# Patient Record
Sex: Male | Born: 1960 | Race: Black or African American | Hispanic: No | Marital: Married | Smoking: Never smoker
Health system: Southern US, Community
[De-identification: ages and names within clinical notes are randomized; demographics above are authoritative.]

## PROBLEM LIST (undated history)

## (undated) DIAGNOSIS — E669 Obesity, unspecified: Secondary | ICD-10-CM

## (undated) DIAGNOSIS — I10 Essential (primary) hypertension: Secondary | ICD-10-CM

## (undated) DIAGNOSIS — I509 Heart failure, unspecified: Secondary | ICD-10-CM

## (undated) DIAGNOSIS — I4891 Unspecified atrial fibrillation: Secondary | ICD-10-CM

## (undated) DIAGNOSIS — M79652 Pain in left thigh: Secondary | ICD-10-CM

## (undated) DIAGNOSIS — M1990 Unspecified osteoarthritis, unspecified site: Secondary | ICD-10-CM

## (undated) DIAGNOSIS — E785 Hyperlipidemia, unspecified: Secondary | ICD-10-CM

## (undated) DIAGNOSIS — I8390 Asymptomatic varicose veins of unspecified lower extremity: Secondary | ICD-10-CM

## (undated) DIAGNOSIS — K589 Irritable bowel syndrome without diarrhea: Secondary | ICD-10-CM

## (undated) HISTORY — DX: Unspecified atrial fibrillation: I48.91

## (undated) HISTORY — DX: Unspecified osteoarthritis, unspecified site: M19.90

## (undated) HISTORY — PX: CARDIAC CATHETERIZATION: SHX172

## (undated) HISTORY — DX: Heart failure, unspecified: I50.9

## (undated) HISTORY — DX: Pain in left thigh: M79.652

## (undated) HISTORY — DX: Irritable bowel syndrome, unspecified: K58.9

## (undated) HISTORY — DX: Asymptomatic varicose veins of unspecified lower extremity: I83.90

## (undated) HISTORY — DX: Obesity, unspecified: E66.9

## (undated) HISTORY — DX: Hyperlipidemia, unspecified: E78.5

## (undated) HISTORY — DX: Essential (primary) hypertension: I10

---

## 1998-01-12 ENCOUNTER — Encounter: Admission: RE | Admit: 1998-01-12 | Discharge: 1998-01-12 | Payer: Self-pay | Admitting: *Deleted

## 2001-06-18 ENCOUNTER — Encounter: Payer: Self-pay | Admitting: Occupational Medicine

## 2001-06-18 ENCOUNTER — Encounter: Admission: RE | Admit: 2001-06-18 | Discharge: 2001-06-18 | Payer: Self-pay | Admitting: Occupational Medicine

## 2001-12-17 ENCOUNTER — Encounter: Admission: RE | Admit: 2001-12-17 | Discharge: 2001-12-17 | Payer: Self-pay | Admitting: Family Medicine

## 2001-12-17 ENCOUNTER — Encounter: Payer: Self-pay | Admitting: Family Medicine

## 2002-01-21 ENCOUNTER — Encounter: Payer: Self-pay | Admitting: Family Medicine

## 2002-01-21 ENCOUNTER — Ambulatory Visit (HOSPITAL_COMMUNITY): Admission: RE | Admit: 2002-01-21 | Discharge: 2002-01-21 | Payer: Self-pay | Admitting: Family Medicine

## 2004-06-08 ENCOUNTER — Encounter: Admission: RE | Admit: 2004-06-08 | Discharge: 2004-06-08 | Payer: Self-pay | Admitting: Occupational Medicine

## 2004-06-20 ENCOUNTER — Encounter: Admission: RE | Admit: 2004-06-20 | Discharge: 2004-06-20 | Payer: Self-pay | Admitting: Occupational Medicine

## 2004-07-03 ENCOUNTER — Encounter: Admission: RE | Admit: 2004-07-03 | Discharge: 2004-10-01 | Payer: Self-pay | Admitting: Nurse Practitioner

## 2004-07-09 ENCOUNTER — Encounter: Admission: RE | Admit: 2004-07-09 | Discharge: 2004-07-09 | Payer: Self-pay | Admitting: Occupational Medicine

## 2004-10-02 ENCOUNTER — Encounter: Admission: RE | Admit: 2004-10-02 | Discharge: 2004-10-09 | Payer: Self-pay | Admitting: Nurse Practitioner

## 2006-10-23 ENCOUNTER — Ambulatory Visit (HOSPITAL_BASED_OUTPATIENT_CLINIC_OR_DEPARTMENT_OTHER): Admission: RE | Admit: 2006-10-23 | Discharge: 2006-10-23 | Payer: Self-pay | Admitting: General Surgery

## 2006-10-23 ENCOUNTER — Encounter (INDEPENDENT_AMBULATORY_CARE_PROVIDER_SITE_OTHER): Payer: Self-pay | Admitting: General Surgery

## 2008-09-23 ENCOUNTER — Encounter: Admission: RE | Admit: 2008-09-23 | Discharge: 2008-09-23 | Payer: Self-pay | Admitting: Family Medicine

## 2010-10-02 NOTE — Op Note (Signed)
NAME:  Parker Calderon, Parker Calderon                 ACCOUNT NO.:  [id number]   MEDICAL RECORD NO.:  [id number]          PATIENT TYPE:  AMB   LOCATION:  DSC                          FACILITY:  [facility]   PHYSICIAN:  Gabrielle Dare. Janee Morn, M.D.DATE OF BIRTH:  21-Jul-1960   DATE OF PROCEDURE:  10/23/2006  DATE OF DISCHARGE:                               OPERATIVE REPORT   PREOPERATIVE DIAGNOSIS:  Mass right chest wall.   POSTOPERATIVE DIAGNOSIS:  Mass right chest wall.   PROCEDURE:  Excision mass right chest wall, 10 cm.   SURGEON:  Gabrielle Dare. Janee Morn, M.D.   ANESTHESIA:  General with laryngeal mask airway.   HISTORY OF PRESENT ILLNESS:  Mr. Grape is a 50 year old gentleman whom  I evaluated in the office for a painful mass in his right chest wall.  CT scan had been performed.  This was consistent with a lipoma,  approximately 10 x 3 cm in size.  He presents today for elective  excision.   PROCEDURE IN DETAIL:  Informed consent was obtained.  The patient's site  was marked.  He received intravenous antibiotics.  He was brought to the  operating room.  General anesthesia was administered with laryngeal mask  airway by the anesthesia staff.  His right chest and abdomen were  prepped and draped in a sterile fashion.  0.25% Marcaine was infiltrated  for postoperative pain relief.  A transverse incision was made over the  mass.  Subcutaneous tissues were dissected down.  The mass was  encapsulated but lobulated.  It was circumferentially dissected.  It  extended down to the chest wall fascia.  It was dissected off the chest  wall fascia using Bovie cautery without going through the fascia.  It  was further circumferentially dissected from surrounding subcutaneous  fat, and again it shelled out fairly easily and was removed in one piece  and sent to pathology.  It was approximately 10 x 4 cm.  The wound was  copiously irrigated with saline.  Meticulous hemostasis was ensured.  The wound was then closed in  layers, with deep and superficial  subcutaneous tissues approximated with interrupted 2-0 Vicryl sutures.  Some additional local anesthetic was again injected, and the skin was  closed with running 4-0 Monocryl subcuticular stitch.  Sponge, needle,  and instrument counts were correct.  Benzoin, Steri-Strips, and a bulky  sterile pressure dressing were applied.  The patient tolerated the  procedure well without apparent complication and was taken to the  recovery room in stable condition.      Gabrielle Dare Janee Morn, M.D.  Electronically Signed     BET/MEDQ  D:  10/23/2006  T:  10/23/2006  Job:  [id number]   cc:   Tally Joe, M.D.

## 2011-03-07 LAB — POCT HEMOGLOBIN-HEMACUE: Hemoglobin: 16.5

## 2012-09-29 ENCOUNTER — Encounter: Payer: Self-pay | Admitting: Vascular Surgery

## 2012-09-29 ENCOUNTER — Telehealth: Payer: Self-pay | Admitting: Vascular Surgery

## 2012-09-29 ENCOUNTER — Other Ambulatory Visit: Payer: Self-pay

## 2012-09-29 DIAGNOSIS — I83893 Varicose veins of bilateral lower extremities with other complications: Secondary | ICD-10-CM

## 2012-09-29 DIAGNOSIS — M79609 Pain in unspecified limb: Secondary | ICD-10-CM

## 2012-09-29 NOTE — Telephone Encounter (Signed)
Spoke with pt, gave appt info, sent letter, unable to enter insurance info, back of card not faxed - kf

## 2012-09-30 ENCOUNTER — Other Ambulatory Visit: Payer: Self-pay

## 2012-09-30 DIAGNOSIS — I83893 Varicose veins of bilateral lower extremities with other complications: Secondary | ICD-10-CM

## 2012-09-30 DIAGNOSIS — M79609 Pain in unspecified limb: Secondary | ICD-10-CM

## 2012-11-13 ENCOUNTER — Encounter: Payer: Self-pay | Admitting: Vascular Surgery

## 2012-12-09 ENCOUNTER — Encounter: Payer: Self-pay | Admitting: Vascular Surgery

## 2012-12-14 ENCOUNTER — Encounter: Payer: Self-pay | Admitting: Surgery

## 2013-01-07 ENCOUNTER — Encounter: Payer: Self-pay | Admitting: Vascular Surgery

## 2013-01-08 ENCOUNTER — Encounter (INDEPENDENT_AMBULATORY_CARE_PROVIDER_SITE_OTHER): Payer: Managed Care, Other (non HMO) | Admitting: *Deleted

## 2013-01-08 ENCOUNTER — Ambulatory Visit (INDEPENDENT_AMBULATORY_CARE_PROVIDER_SITE_OTHER): Payer: Managed Care, Other (non HMO) | Admitting: Vascular Surgery

## 2013-01-08 ENCOUNTER — Encounter: Payer: Self-pay | Admitting: Vascular Surgery

## 2013-01-08 VITALS — BP 173/103 | HR 73 | Ht 71.0 in | Wt 294.0 lb

## 2013-01-08 DIAGNOSIS — I83893 Varicose veins of bilateral lower extremities with other complications: Secondary | ICD-10-CM

## 2013-01-08 DIAGNOSIS — M79609 Pain in unspecified limb: Secondary | ICD-10-CM

## 2013-01-08 DIAGNOSIS — I872 Venous insufficiency (chronic) (peripheral): Secondary | ICD-10-CM

## 2013-01-08 NOTE — Progress Notes (Signed)
[facility] [place]  Referred by:  Ricci Barker, PA-C [address] [place], [place] [address]  Reason for referral: B leg varicosities  History of Present Illness  Parker Calderon is a 52 y.o. (1960/05/23) male who presents with chief complaint: B leg varicosities.  Patient notes, onset of swelling years ago, associated with no obvious trigger.  The patient's symptoms include: swelling both legs, vein protrusion, and mild calf pain.  The patient has had no history of DVT, no history of pregnancy, known history of varicose vein, no history of venous stasis ulcers, no history of  Lymphedema and no history of skin changes in lower legs.  There is known family history of venous disorders.  The patient has used OTC compression stockings in the past.  Past Medical History  Diagnosis Date  . Left thigh pain     Lateral, aching  . Varicose veins   . Arthritis     Rhinitis  . Irritable bowel syndrome   . Hyperlipidemia   . Obesity, unspecified     History reviewed. No pertinent past surgical history.  History   Social History  . Marital Status: Single    Spouse Name: N/A    Number of Children: N/A  . Years of Education: N/A   Occupational History  . Not on file.   Social History Main Topics  . Smoking status: Never Smoker   . Smokeless tobacco: Never Used  . Alcohol Use: No  . Drug Use: No  . Sexual Activity: Not on file   Other Topics Concern  . Not on file   Social History Narrative  . No narrative on file    FamHx  Mother: varicose veins  Father: unknown   Current Outpatient Prescriptions on File Prior to Visit  Medication Sig Dispense Refill  . Multiple Vitamin (MULTIVITAMIN) tablet Take 1 tablet by mouth daily.       No current facility-administered medications on file prior to visit.    No Known Allergies   REVIEW OF SYSTEMS:  (Positives checked otherwise negative)  CARDIOVASCULAR:  []  chest pain, []  chest pressure, []   palpitations, []  shortness of breath when laying flat, []  shortness of breath with exertion,  []  pain in feet when walking, []  pain in feet when laying flat, []  history of blood clot in veins (DVT), []  history of phlebitis, []  swelling in legs, [x]  varicose veins  PULMONARY:  []  productive cough, []  asthma, []  wheezing  NEUROLOGIC:  []  weakness in arms or legs, []  numbness in arms or legs, []  difficulty speaking or slurred speech, []  temporary loss of vision in one eye, []  dizziness  HEMATOLOGIC:  []  bleeding problems, []  problems with blood clotting too easily  MUSCULOSKEL:  []  joint pain, []  joint swelling  GASTROINTEST:  []  vomiting blood, []  blood in stool     GENITOURINARY:  []  burning with urination, []  blood in urine  PSYCHIATRIC:  []  history of major depression  INTEGUMENTARY:  []  rashes, []  ulcers  CONSTITUTIONAL:  []  fever, []  chills   Physical Examination Filed Vitals:   01/08/13 1008  BP: 173/103  Pulse: 73  Height: 5\' 11"  (1.803 m)  Weight: 294 lb (133.358 kg)  SpO2: 97%   Body mass index is 41.02 kg/(m^2).  General: A&O x 3, WD, obese  Head: [place]/AT  Ear/Nose/Throat: Hearing grossly intact, nares w/o erythema or drainage, oropharynx w/o Erythema/Exudate  Eyes: PERRLA, EOMI  Neck: Supple, no nuchal rigidity, no palpable LAD  Pulmonary:  Sym exp, good air movt, CTAB, no rales, rhonchi, & wheezing  Cardiac: RRR, Nl S1, S2, no Murmurs, rubs or gallops  Vascular: Vessel Right Left  Radial Palpable Palpable  Brachial  Palpable  Palpable  Carotid Palpable, without bruit Palpable, without bruit  Aorta Not palpable N/A  Femoral Palpable Palpable  Popliteal Not palpable Not palpable  PT Not Palpable Not Palpable  DP Palpable Palpable   Gastrointestinal: soft, NTND, -G/R, - HSM, - masses, - CVAT B  Musculoskeletal: M/S 5/5 throughout , Extremities without ischemic changes , extensive varicosities B with some clusters on lateral calves and L lateral  thigh  Neurologic: CN 2-12 intact , Pain and light touch intact in extremities , Motor exam as listed above  Psychiatric: Judgment intact, Mood & affect appropriate for pt's clinical situation  Dermatologic: See M/S exam for extremity exam, no rashes otherwise noted  Lymph : No Cervical, Axillary, or Inguinal lymphadenopathy   Non-Invasive Vascular Imaging   BLE Venous Insufficiency Duplex (Date: 01/08/2013):   RLE: no DVT and SVT, no GSV reflux, no deep venous reflux  LLE: no DVT and SVT, no GSV reflux, no deep venous reflux  Outside Studies/Documentation 5 pages of outside documents were reviewed including: outpatient clinic chart.  Medical Decision Making  Parker Calderon is a 52 y.o. male who presents with: BLE chronic venous insufficiency (C2).   Surprisingly, the BLE venous reflux was not significantly positive.  I suspect the patient may be early in his CVI process given the extensive varicosities he has.  Some of his sx are c/w with CVI as the etiology.  Based on the patient's history and examination, I recommend: compressive therapy.  I discussed with the patient the use of her 20-30 mm thigh high compression stockings and need for 3 month trial of such.  The patient will follow up in 3 months with my partners in the [facility] for evaluation for: stab phlebectomy.  Thank you for allowing [place] to participate in this patient's care.  Leonides Sake, MD [facility] [place] Office: [phone] Pager: [phone]  01/08/2013, 11:03 AM

## 2013-02-01 ENCOUNTER — Telehealth: Payer: Self-pay | Admitting: *Deleted

## 2013-02-01 NOTE — Telephone Encounter (Signed)
Parker Calderon did not understand why the venous reflux exam was done and what the results meant.  I explained to him in detail and at length that the venous reflux exam is the diagnostic tool that vascular physicians use to diagnosis venous insufficiency in the deep and superficial systems.  I reviewed the results of his venous reflux exam which showed no reflux or venous insufficiency in the deep or superficial systems.  He requested copies of Dr. Nicky Pugh office note and the venous reflux exam on 01-08-2013.  I took his information and had Scarlette Calico from Medical Records send him the forms to fill out for medical release of records via mail per his request.

## 2013-04-13 ENCOUNTER — Ambulatory Visit: Payer: Managed Care, Other (non HMO) | Admitting: Vascular Surgery

## 2015-09-01 DIAGNOSIS — R05 Cough: Secondary | ICD-10-CM | POA: Diagnosis not present

## 2015-09-01 DIAGNOSIS — J101 Influenza due to other identified influenza virus with other respiratory manifestations: Secondary | ICD-10-CM | POA: Diagnosis not present

## 2015-09-01 DIAGNOSIS — H1013 Acute atopic conjunctivitis, bilateral: Secondary | ICD-10-CM | POA: Diagnosis not present

## 2016-03-15 DIAGNOSIS — R030 Elevated blood-pressure reading, without diagnosis of hypertension: Secondary | ICD-10-CM | POA: Diagnosis not present

## 2016-03-15 DIAGNOSIS — N62 Hypertrophy of breast: Secondary | ICD-10-CM | POA: Diagnosis not present

## 2016-03-15 DIAGNOSIS — R7303 Prediabetes: Secondary | ICD-10-CM | POA: Diagnosis not present

## 2016-03-15 DIAGNOSIS — R6882 Decreased libido: Secondary | ICD-10-CM | POA: Diagnosis not present

## 2016-03-18 ENCOUNTER — Other Ambulatory Visit: Payer: Self-pay | Admitting: Family Medicine

## 2016-03-18 DIAGNOSIS — N62 Hypertrophy of breast: Secondary | ICD-10-CM

## 2016-04-15 DIAGNOSIS — E291 Testicular hypofunction: Secondary | ICD-10-CM | POA: Diagnosis not present

## 2016-04-16 ENCOUNTER — Other Ambulatory Visit: Payer: Self-pay | Admitting: Family Medicine

## 2016-04-16 DIAGNOSIS — N62 Hypertrophy of breast: Secondary | ICD-10-CM

## 2016-04-22 ENCOUNTER — Ambulatory Visit
Admission: RE | Admit: 2016-04-22 | Discharge: 2016-04-22 | Disposition: A | Discharge status: Home / Self Care | Payer: BLUE CROSS/BLUE SHIELD | Source: Ambulatory Visit | Attending: Family Medicine | Admitting: Family Medicine

## 2016-04-22 ENCOUNTER — Other Ambulatory Visit: Payer: Self-pay | Admitting: Family Medicine

## 2016-04-22 DIAGNOSIS — N62 Hypertrophy of breast: Secondary | ICD-10-CM

## 2016-04-22 DIAGNOSIS — N6489 Other specified disorders of breast: Secondary | ICD-10-CM | POA: Diagnosis not present

## 2016-04-22 DIAGNOSIS — R928 Other abnormal and inconclusive findings on diagnostic imaging of breast: Secondary | ICD-10-CM | POA: Diagnosis not present

## 2016-04-30 ENCOUNTER — Other Ambulatory Visit: Payer: Self-pay | Admitting: Family Medicine

## 2016-04-30 DIAGNOSIS — E221 Hyperprolactinemia: Secondary | ICD-10-CM

## 2016-04-30 DIAGNOSIS — E291 Testicular hypofunction: Secondary | ICD-10-CM

## 2016-05-06 ENCOUNTER — Ambulatory Visit
Admission: RE | Admit: 2016-05-06 | Discharge: 2016-05-06 | Disposition: A | Discharge status: Home / Self Care | Payer: BLUE CROSS/BLUE SHIELD | Source: Ambulatory Visit | Attending: Family Medicine | Admitting: Family Medicine

## 2016-05-06 DIAGNOSIS — E221 Hyperprolactinemia: Secondary | ICD-10-CM

## 2016-05-06 DIAGNOSIS — R930 Abnormal findings on diagnostic imaging of skull and head, not elsewhere classified: Secondary | ICD-10-CM | POA: Diagnosis not present

## 2016-05-06 DIAGNOSIS — E291 Testicular hypofunction: Secondary | ICD-10-CM

## 2016-05-06 MED ORDER — GADOBENATE DIMEGLUMINE 529 MG/ML IV SOLN
10.0000 mL | Freq: Once | INTRAVENOUS | Status: AC | PRN
  Administered 2016-05-06: 10 mL via INTRAVENOUS

## 2016-05-27 DIAGNOSIS — D352 Benign neoplasm of pituitary gland: Secondary | ICD-10-CM | POA: Diagnosis not present

## 2016-05-27 DIAGNOSIS — R030 Elevated blood-pressure reading, without diagnosis of hypertension: Secondary | ICD-10-CM | POA: Diagnosis not present

## 2016-05-27 DIAGNOSIS — Z6841 Body Mass Index (BMI) 40.0 and over, adult: Secondary | ICD-10-CM | POA: Diagnosis not present

## 2016-06-04 DIAGNOSIS — E782 Mixed hyperlipidemia: Secondary | ICD-10-CM | POA: Diagnosis not present

## 2016-06-24 DIAGNOSIS — E291 Testicular hypofunction: Secondary | ICD-10-CM | POA: Diagnosis not present

## 2016-06-24 DIAGNOSIS — R7309 Other abnormal glucose: Secondary | ICD-10-CM | POA: Diagnosis not present

## 2016-06-24 DIAGNOSIS — E221 Hyperprolactinemia: Secondary | ICD-10-CM | POA: Diagnosis not present

## 2017-03-22 DIAGNOSIS — J069 Acute upper respiratory infection, unspecified: Secondary | ICD-10-CM | POA: Diagnosis not present

## 2017-06-07 DIAGNOSIS — R030 Elevated blood-pressure reading, without diagnosis of hypertension: Secondary | ICD-10-CM | POA: Diagnosis not present

## 2017-06-07 DIAGNOSIS — Z136 Encounter for screening for cardiovascular disorders: Secondary | ICD-10-CM | POA: Diagnosis not present

## 2017-06-07 DIAGNOSIS — Z13220 Encounter for screening for lipoid disorders: Secondary | ICD-10-CM | POA: Diagnosis not present

## 2017-06-07 DIAGNOSIS — Z6841 Body Mass Index (BMI) 40.0 and over, adult: Secondary | ICD-10-CM | POA: Diagnosis not present

## 2017-06-07 DIAGNOSIS — Z713 Dietary counseling and surveillance: Secondary | ICD-10-CM | POA: Diagnosis not present

## 2018-01-24 DIAGNOSIS — H1010 Acute atopic conjunctivitis, unspecified eye: Secondary | ICD-10-CM | POA: Diagnosis not present

## 2018-01-24 DIAGNOSIS — J069 Acute upper respiratory infection, unspecified: Secondary | ICD-10-CM | POA: Diagnosis not present

## 2018-02-15 DIAGNOSIS — R05 Cough: Secondary | ICD-10-CM | POA: Diagnosis not present

## 2018-06-13 DIAGNOSIS — Z6841 Body Mass Index (BMI) 40.0 and over, adult: Secondary | ICD-10-CM | POA: Diagnosis not present

## 2018-06-13 DIAGNOSIS — R030 Elevated blood-pressure reading, without diagnosis of hypertension: Secondary | ICD-10-CM | POA: Diagnosis not present

## 2018-06-13 DIAGNOSIS — Z13220 Encounter for screening for lipoid disorders: Secondary | ICD-10-CM | POA: Diagnosis not present

## 2018-06-13 DIAGNOSIS — Z713 Dietary counseling and surveillance: Secondary | ICD-10-CM | POA: Diagnosis not present

## 2018-06-13 DIAGNOSIS — Z136 Encounter for screening for cardiovascular disorders: Secondary | ICD-10-CM | POA: Diagnosis not present

## 2018-12-14 ENCOUNTER — Other Ambulatory Visit: Payer: Self-pay

## 2018-12-14 DIAGNOSIS — Z20822 Contact with and (suspected) exposure to covid-19: Secondary | ICD-10-CM

## 2018-12-14 DIAGNOSIS — R6889 Other general symptoms and signs: Secondary | ICD-10-CM | POA: Diagnosis not present

## 2018-12-16 LAB — NOVEL CORONAVIRUS, NAA: SARS-CoV-2, NAA: NOT DETECTED

## 2019-03-15 DIAGNOSIS — R109 Unspecified abdominal pain: Secondary | ICD-10-CM | POA: Diagnosis not present

## 2019-03-15 DIAGNOSIS — I480 Paroxysmal atrial fibrillation: Secondary | ICD-10-CM | POA: Diagnosis not present

## 2019-03-15 DIAGNOSIS — R0602 Shortness of breath: Secondary | ICD-10-CM | POA: Diagnosis not present

## 2019-03-15 DIAGNOSIS — R7303 Prediabetes: Secondary | ICD-10-CM | POA: Diagnosis not present

## 2019-03-15 DIAGNOSIS — E229 Hyperfunction of pituitary gland, unspecified: Secondary | ICD-10-CM | POA: Diagnosis not present

## 2019-03-21 ENCOUNTER — Other Ambulatory Visit: Payer: Self-pay

## 2019-03-21 ENCOUNTER — Encounter (HOSPITAL_COMMUNITY): Payer: Self-pay

## 2019-03-21 ENCOUNTER — Emergency Department (HOSPITAL_COMMUNITY): Payer: BC Managed Care – PPO

## 2019-03-21 DIAGNOSIS — D72829 Elevated white blood cell count, unspecified: Secondary | ICD-10-CM | POA: Diagnosis not present

## 2019-03-21 DIAGNOSIS — I161 Hypertensive emergency: Secondary | ICD-10-CM | POA: Diagnosis not present

## 2019-03-21 DIAGNOSIS — E669 Obesity, unspecified: Secondary | ICD-10-CM | POA: Diagnosis not present

## 2019-03-21 DIAGNOSIS — Z6841 Body Mass Index (BMI) 40.0 and over, adult: Secondary | ICD-10-CM

## 2019-03-21 DIAGNOSIS — Z20828 Contact with and (suspected) exposure to other viral communicable diseases: Secondary | ICD-10-CM | POA: Diagnosis not present

## 2019-03-21 DIAGNOSIS — K589 Irritable bowel syndrome without diarrhea: Secondary | ICD-10-CM | POA: Diagnosis present

## 2019-03-21 DIAGNOSIS — N179 Acute kidney failure, unspecified: Secondary | ICD-10-CM | POA: Diagnosis not present

## 2019-03-21 DIAGNOSIS — E662 Morbid (severe) obesity with alveolar hypoventilation: Secondary | ICD-10-CM | POA: Diagnosis present

## 2019-03-21 DIAGNOSIS — M1990 Unspecified osteoarthritis, unspecified site: Secondary | ICD-10-CM | POA: Diagnosis not present

## 2019-03-21 DIAGNOSIS — R030 Elevated blood-pressure reading, without diagnosis of hypertension: Secondary | ICD-10-CM | POA: Diagnosis not present

## 2019-03-21 DIAGNOSIS — Z841 Family history of disorders of kidney and ureter: Secondary | ICD-10-CM

## 2019-03-21 DIAGNOSIS — R601 Generalized edema: Secondary | ICD-10-CM | POA: Diagnosis not present

## 2019-03-21 DIAGNOSIS — I340 Nonrheumatic mitral (valve) insufficiency: Secondary | ICD-10-CM | POA: Diagnosis not present

## 2019-03-21 DIAGNOSIS — M7989 Other specified soft tissue disorders: Secondary | ICD-10-CM | POA: Diagnosis present

## 2019-03-21 DIAGNOSIS — E785 Hyperlipidemia, unspecified: Secondary | ICD-10-CM | POA: Diagnosis present

## 2019-03-21 DIAGNOSIS — I5082 Biventricular heart failure: Secondary | ICD-10-CM | POA: Diagnosis present

## 2019-03-21 DIAGNOSIS — E876 Hypokalemia: Secondary | ICD-10-CM | POA: Diagnosis present

## 2019-03-21 DIAGNOSIS — I5021 Acute systolic (congestive) heart failure: Secondary | ICD-10-CM | POA: Diagnosis not present

## 2019-03-21 DIAGNOSIS — Z79899 Other long term (current) drug therapy: Secondary | ICD-10-CM | POA: Diagnosis not present

## 2019-03-21 DIAGNOSIS — Z7901 Long term (current) use of anticoagulants: Secondary | ICD-10-CM | POA: Diagnosis not present

## 2019-03-21 DIAGNOSIS — I4891 Unspecified atrial fibrillation: Secondary | ICD-10-CM | POA: Diagnosis present

## 2019-03-21 DIAGNOSIS — I361 Nonrheumatic tricuspid (valve) insufficiency: Secondary | ICD-10-CM | POA: Diagnosis not present

## 2019-03-21 DIAGNOSIS — I110 Hypertensive heart disease with heart failure: Secondary | ICD-10-CM | POA: Diagnosis not present

## 2019-03-21 DIAGNOSIS — R0989 Other specified symptoms and signs involving the circulatory and respiratory systems: Secondary | ICD-10-CM | POA: Diagnosis not present

## 2019-03-21 DIAGNOSIS — I342 Nonrheumatic mitral (valve) stenosis: Secondary | ICD-10-CM | POA: Diagnosis not present

## 2019-03-21 DIAGNOSIS — I509 Heart failure, unspecified: Secondary | ICD-10-CM | POA: Diagnosis not present

## 2019-03-21 LAB — CBC
HCT: 46.1 % (ref 39.0–52.0)
Hemoglobin: 14.6 g/dL (ref 13.0–17.0)
MCH: 31.7 pg (ref 26.0–34.0)
MCHC: 31.7 g/dL (ref 30.0–36.0)
MCV: 100.2 fL — ABNORMAL HIGH (ref 80.0–100.0)
Platelets: 277 10*3/uL (ref 150–400)
RBC: 4.6 MIL/uL (ref 4.22–5.81)
RDW: 14.3 % (ref 11.5–15.5)
WBC: 12.1 10*3/uL — ABNORMAL HIGH (ref 4.0–10.5)
nRBC: 0 % (ref 0.0–0.2)

## 2019-03-21 LAB — BASIC METABOLIC PANEL
Anion gap: 9 (ref 5–15)
BUN: 16 mg/dL (ref 6–20)
CO2: 26 mmol/L (ref 22–32)
Calcium: 8.6 mg/dL — ABNORMAL LOW (ref 8.9–10.3)
Chloride: 106 mmol/L (ref 98–111)
Creatinine, Ser: 0.9 mg/dL (ref 0.61–1.24)
GFR calc Af Amer: >60 mL/min (ref >60)
GFR calc non Af Amer: >60 mL/min (ref >60)
Glucose, Bld: 112 mg/dL — ABNORMAL HIGH (ref 70–99)
Potassium: 3.3 mmol/L — ABNORMAL LOW (ref 3.5–5.1)
Sodium: 141 mmol/L (ref 135–145)

## 2019-03-21 MED ORDER — SODIUM CHLORIDE 0.9% FLUSH
3.0000 mL | Freq: Once | INTRAVENOUS | Status: AC
  Administered 2019-03-22: 3 mL via INTRAVENOUS

## 2019-03-21 NOTE — ED Triage Notes (Signed)
Pt arrived stating that he has been short of breath and having edema in the abdomen and bilateral feet. Reports today was the first day of diltiazem and Zarelto. Was told by provider to come in for increased swelling.

## 2019-03-22 ENCOUNTER — Encounter (HOSPITAL_COMMUNITY): Payer: Self-pay

## 2019-03-22 ENCOUNTER — Other Ambulatory Visit: Payer: Self-pay

## 2019-03-22 ENCOUNTER — Encounter (HOSPITAL_COMMUNITY): Payer: Self-pay | Admitting: Emergency Medicine

## 2019-03-22 ENCOUNTER — Inpatient Hospital Stay (HOSPITAL_COMMUNITY): Payer: BC Managed Care – PPO

## 2019-03-22 ENCOUNTER — Inpatient Hospital Stay (HOSPITAL_COMMUNITY)
Admission: EM | Admit: 2019-03-22 | Discharge: 2019-03-27 | DRG: 286 | Disposition: A | Discharge status: Home / Self Care | Payer: BC Managed Care – PPO | Attending: Cardiovascular Disease | Admitting: Cardiovascular Disease

## 2019-03-22 DIAGNOSIS — I5082 Biventricular heart failure: Secondary | ICD-10-CM | POA: Diagnosis present

## 2019-03-22 DIAGNOSIS — R030 Elevated blood-pressure reading, without diagnosis of hypertension: Secondary | ICD-10-CM

## 2019-03-22 DIAGNOSIS — I342 Nonrheumatic mitral (valve) stenosis: Secondary | ICD-10-CM | POA: Diagnosis not present

## 2019-03-22 DIAGNOSIS — I5021 Acute systolic (congestive) heart failure: Secondary | ICD-10-CM

## 2019-03-22 DIAGNOSIS — I110 Hypertensive heart disease with heart failure: Secondary | ICD-10-CM | POA: Diagnosis not present

## 2019-03-22 DIAGNOSIS — N179 Acute kidney failure, unspecified: Secondary | ICD-10-CM | POA: Diagnosis present

## 2019-03-22 DIAGNOSIS — Z6841 Body Mass Index (BMI) 40.0 and over, adult: Secondary | ICD-10-CM | POA: Diagnosis not present

## 2019-03-22 DIAGNOSIS — I4891 Unspecified atrial fibrillation: Secondary | ICD-10-CM | POA: Diagnosis present

## 2019-03-22 DIAGNOSIS — Z79899 Other long term (current) drug therapy: Secondary | ICD-10-CM | POA: Diagnosis not present

## 2019-03-22 DIAGNOSIS — I509 Heart failure, unspecified: Secondary | ICD-10-CM

## 2019-03-22 DIAGNOSIS — M7989 Other specified soft tissue disorders: Secondary | ICD-10-CM | POA: Diagnosis present

## 2019-03-22 DIAGNOSIS — I340 Nonrheumatic mitral (valve) insufficiency: Secondary | ICD-10-CM | POA: Diagnosis not present

## 2019-03-22 DIAGNOSIS — Z20828 Contact with and (suspected) exposure to other viral communicable diseases: Secondary | ICD-10-CM | POA: Diagnosis present

## 2019-03-22 DIAGNOSIS — E8770 Fluid overload, unspecified: Secondary | ICD-10-CM

## 2019-03-22 DIAGNOSIS — E785 Hyperlipidemia, unspecified: Secondary | ICD-10-CM | POA: Diagnosis not present

## 2019-03-22 DIAGNOSIS — I361 Nonrheumatic tricuspid (valve) insufficiency: Secondary | ICD-10-CM | POA: Diagnosis not present

## 2019-03-22 DIAGNOSIS — E662 Morbid (severe) obesity with alveolar hypoventilation: Secondary | ICD-10-CM | POA: Diagnosis present

## 2019-03-22 DIAGNOSIS — I161 Hypertensive emergency: Secondary | ICD-10-CM | POA: Diagnosis not present

## 2019-03-22 DIAGNOSIS — E876 Hypokalemia: Secondary | ICD-10-CM | POA: Diagnosis present

## 2019-03-22 DIAGNOSIS — R601 Generalized edema: Secondary | ICD-10-CM

## 2019-03-22 DIAGNOSIS — Z7901 Long term (current) use of anticoagulants: Secondary | ICD-10-CM | POA: Diagnosis not present

## 2019-03-22 DIAGNOSIS — E669 Obesity, unspecified: Secondary | ICD-10-CM | POA: Diagnosis present

## 2019-03-22 DIAGNOSIS — Z841 Family history of disorders of kidney and ureter: Secondary | ICD-10-CM | POA: Diagnosis not present

## 2019-03-22 DIAGNOSIS — K589 Irritable bowel syndrome without diarrhea: Secondary | ICD-10-CM | POA: Diagnosis present

## 2019-03-22 DIAGNOSIS — D72829 Elevated white blood cell count, unspecified: Secondary | ICD-10-CM | POA: Diagnosis present

## 2019-03-22 DIAGNOSIS — R9431 Abnormal electrocardiogram [ECG] [EKG]: Secondary | ICD-10-CM

## 2019-03-22 DIAGNOSIS — M1990 Unspecified osteoarthritis, unspecified site: Secondary | ICD-10-CM | POA: Diagnosis present

## 2019-03-22 LAB — BASIC METABOLIC PANEL
Anion gap: 11 (ref 5–15)
Anion gap: 9 (ref 5–15)
Anion gap: 10 (ref 5–15)
BUN: 15 mg/dL (ref 6–20)
BUN: 14 mg/dL (ref 6–20)
BUN: 15 mg/dL (ref 6–20)
CO2: 28 mmol/L (ref 22–32)
CO2: 29 mmol/L (ref 22–32)
CO2: 28 mmol/L (ref 22–32)
Calcium: 8.9 mg/dL (ref 8.9–10.3)
Calcium: 8.9 mg/dL (ref 8.9–10.3)
Calcium: 9 mg/dL (ref 8.9–10.3)
Chloride: 104 mmol/L (ref 98–111)
Chloride: 103 mmol/L (ref 98–111)
Chloride: 105 mmol/L (ref 98–111)
Creatinine, Ser: 0.88 mg/dL (ref 0.61–1.24)
Creatinine, Ser: 0.96 mg/dL (ref 0.61–1.24)
Creatinine, Ser: 0.97 mg/dL (ref 0.61–1.24)
GFR calc Af Amer: >60 mL/min (ref >60)
GFR calc Af Amer: >60 mL/min (ref >60)
GFR calc Af Amer: >60 mL/min (ref >60)
GFR calc non Af Amer: >60 mL/min (ref >60)
GFR calc non Af Amer: >60 mL/min (ref >60)
GFR calc non Af Amer: >60 mL/min (ref >60)
Glucose, Bld: 106 mg/dL — ABNORMAL HIGH (ref 70–99)
Glucose, Bld: 117 mg/dL — ABNORMAL HIGH (ref 70–99)
Glucose, Bld: 138 mg/dL — ABNORMAL HIGH (ref 70–99)
Potassium: 3 mmol/L — ABNORMAL LOW (ref 3.5–5.1)
Potassium: 3.1 mmol/L — ABNORMAL LOW (ref 3.5–5.1)
Potassium: 3.5 mmol/L (ref 3.5–5.1)
Sodium: 143 mmol/L (ref 135–145)
Sodium: 141 mmol/L (ref 135–145)
Sodium: 143 mmol/L (ref 135–145)

## 2019-03-22 LAB — URINALYSIS, ROUTINE W REFLEX MICROSCOPIC
Bilirubin Urine: NEGATIVE
Glucose, UA: NEGATIVE mg/dL
Ketones, ur: NEGATIVE mg/dL
Leukocytes,Ua: NEGATIVE
Nitrite: NEGATIVE
Protein, ur: NEGATIVE mg/dL
Specific Gravity, Urine: 1.005 (ref 1.005–1.030)
pH: 5 (ref 5.0–8.0)

## 2019-03-22 LAB — SARS CORONAVIRUS 2 BY RT PCR (HOSPITAL ORDER, PERFORMED IN ~~LOC~~ HOSPITAL LAB): SARS Coronavirus 2: NEGATIVE

## 2019-03-22 LAB — CBC
HCT: 46.9 % (ref 39.0–52.0)
Hemoglobin: 14.6 g/dL (ref 13.0–17.0)
MCH: 31.3 pg (ref 26.0–34.0)
MCHC: 31.1 g/dL (ref 30.0–36.0)
MCV: 100.6 fL — ABNORMAL HIGH (ref 80.0–100.0)
Platelets: 285 10*3/uL (ref 150–400)
RBC: 4.66 MIL/uL (ref 4.22–5.81)
RDW: 14.6 % (ref 11.5–15.5)
WBC: 12.2 10*3/uL — ABNORMAL HIGH (ref 4.0–10.5)
nRBC: 0 % (ref 0.0–0.2)

## 2019-03-22 LAB — ECHOCARDIOGRAM COMPLETE
Height: 71 in
Weight: 5888 oz

## 2019-03-22 LAB — PROTIME-INR
INR: 1.5 — ABNORMAL HIGH (ref 0.8–1.2)
Prothrombin Time: 18.2 seconds — ABNORMAL HIGH (ref 11.4–15.2)

## 2019-03-22 LAB — MAGNESIUM: Magnesium: 2 mg/dL (ref 1.7–2.4)

## 2019-03-22 LAB — LIPID PANEL
Cholesterol: 159 mg/dL (ref 0–200)
HDL: 31 mg/dL — ABNORMAL LOW (ref >40)
LDL Cholesterol: 119 mg/dL — ABNORMAL HIGH (ref 0–99)
Total CHOL/HDL Ratio: 5.1 RATIO
Triglycerides: 44 mg/dL (ref <150)
VLDL: 9 mg/dL (ref 0–40)

## 2019-03-22 LAB — T4, FREE: Free T4: 1.19 ng/dL — ABNORMAL HIGH (ref 0.61–1.12)

## 2019-03-22 LAB — APTT
aPTT: 35 seconds (ref 24–36)
aPTT: 37 seconds — ABNORMAL HIGH (ref 24–36)
aPTT: 59 seconds — ABNORMAL HIGH (ref 24–36)
aPTT: 90 seconds — ABNORMAL HIGH (ref 24–36)

## 2019-03-22 LAB — TROPONIN I (HIGH SENSITIVITY)
Troponin I (High Sensitivity): 28 ng/L — ABNORMAL HIGH (ref <18)
Troponin I (High Sensitivity): 25 ng/L — ABNORMAL HIGH (ref <18)

## 2019-03-22 LAB — HEMOGLOBIN A1C
Hgb A1c MFr Bld: 6.1 % — ABNORMAL HIGH (ref 4.8–5.6)
Mean Plasma Glucose: 128.37 mg/dL

## 2019-03-22 LAB — MRSA PCR SCREENING: MRSA by PCR: POSITIVE — AB

## 2019-03-22 LAB — HIV ANTIBODY (ROUTINE TESTING W REFLEX): HIV Screen 4th Generation wRfx: NONREACTIVE

## 2019-03-22 LAB — HEPARIN LEVEL (UNFRACTIONATED): Heparin Unfractionated: >2.2 IU/mL — ABNORMAL HIGH (ref 0.30–0.70)

## 2019-03-22 LAB — BRAIN NATRIURETIC PEPTIDE: B Natriuretic Peptide: 261.6 pg/mL — ABNORMAL HIGH (ref 0.0–100.0)

## 2019-03-22 LAB — TSH: TSH: 1.22 u[IU]/mL (ref 0.350–4.500)

## 2019-03-22 MED ORDER — FUROSEMIDE 10 MG/ML IJ SOLN
60.0000 mg | Freq: Once | INTRAMUSCULAR | Status: AC
  Administered 2019-03-22: 60 mg via INTRAVENOUS
  Filled 2019-03-22: qty 6

## 2019-03-22 MED ORDER — HYDRALAZINE HCL 50 MG PO TABS: 50.0000 mg | ORAL_TABLET | Freq: Four times a day (QID) | ORAL | Status: DC | PRN

## 2019-03-22 MED ORDER — POTASSIUM CHLORIDE CRYS ER 20 MEQ PO TBCR
40.0000 meq | EXTENDED_RELEASE_TABLET | ORAL | Status: AC
  Administered 2019-03-22 (×2): 40 meq via ORAL
  Filled 2019-03-22 (×2): qty 2

## 2019-03-22 MED ORDER — FUROSEMIDE 10 MG/ML IJ SOLN
40.0000 mg | Freq: Two times a day (BID) | INTRAMUSCULAR | Status: DC
  Administered 2019-03-22: 40 mg via INTRAVENOUS
  Filled 2019-03-22: qty 4

## 2019-03-22 MED ORDER — AMIODARONE HCL IN DEXTROSE 360-4.14 MG/200ML-% IV SOLN
60.0000 mg/h | INTRAVENOUS | Status: DC
  Administered 2019-03-22: 60 mg/h via INTRAVENOUS
  Filled 2019-03-22 (×2): qty 200

## 2019-03-22 MED ORDER — FUROSEMIDE 10 MG/ML IJ SOLN
40.0000 mg | Freq: Once | INTRAMUSCULAR | Status: AC
  Administered 2019-03-22: 40 mg via INTRAVENOUS
  Filled 2019-03-22: qty 4

## 2019-03-22 MED ORDER — POTASSIUM CHLORIDE CRYS ER 20 MEQ PO TBCR: 40.0000 meq | EXTENDED_RELEASE_TABLET | Freq: Two times a day (BID) | ORAL | Status: DC

## 2019-03-22 MED ORDER — CHLORHEXIDINE GLUCONATE CLOTH 2 % EX PADS
6.0000 | MEDICATED_PAD | Freq: Every day | CUTANEOUS | Status: DC
  Administered 2019-03-22 – 2019-03-26 (×5): 6 via TOPICAL

## 2019-03-22 MED ORDER — AMIODARONE HCL IN DEXTROSE 360-4.14 MG/200ML-% IV SOLN
30.0000 mg/h | INTRAVENOUS | Status: DC
  Administered 2019-03-22 – 2019-03-25 (×7): 30 mg/h via INTRAVENOUS
  Filled 2019-03-22 (×8): qty 200

## 2019-03-22 MED ORDER — ISOSORB DINITRATE-HYDRALAZINE 20-37.5 MG PO TABS
2.0000 | ORAL_TABLET | Freq: Three times a day (TID) | ORAL | Status: DC
  Administered 2019-03-22 – 2019-03-25 (×10): 2 via ORAL
  Filled 2019-03-22 (×11): qty 2

## 2019-03-22 MED ORDER — HEPARIN (PORCINE) 25000 UT/250ML-% IV SOLN
1500.0000 [IU]/h | INTRAVENOUS | Status: DC
  Administered 2019-03-22: 1700 [IU]/h via INTRAVENOUS
  Administered 2019-03-23 – 2019-03-25 (×3): 1500 [IU]/h via INTRAVENOUS
  Filled 2019-03-22 (×4): qty 250

## 2019-03-22 MED ORDER — MUPIROCIN 2 % EX OINT
1.0000 "application " | TOPICAL_OINTMENT | Freq: Two times a day (BID) | CUTANEOUS | Status: AC
  Administered 2019-03-22 – 2019-03-26 (×10): 1 via NASAL
  Filled 2019-03-22 (×4): qty 22

## 2019-03-22 MED ORDER — SPIRONOLACTONE 25 MG PO TABS
25.0000 mg | ORAL_TABLET | Freq: Every day | ORAL | Status: DC
  Administered 2019-03-22 – 2019-03-27 (×6): 25 mg via ORAL
  Filled 2019-03-22 (×6): qty 1

## 2019-03-22 MED ORDER — DILTIAZEM HCL-DEXTROSE 125-5 MG/125ML-% IV SOLN (PREMIX)
5.0000 mg/h | INTRAVENOUS | Status: DC
  Administered 2019-03-22: 15 mg/h via INTRAVENOUS
  Administered 2019-03-22: 5 mg/h via INTRAVENOUS
  Filled 2019-03-22 (×3): qty 125

## 2019-03-22 MED ORDER — HEPARIN (PORCINE) 25000 UT/250ML-% IV SOLN
1500.0000 [IU]/h | INTRAVENOUS | Status: DC
  Administered 2019-03-22: 1500 [IU]/h via INTRAVENOUS
  Filled 2019-03-22: qty 250

## 2019-03-22 MED ORDER — PERFLUTREN LIPID MICROSPHERE
1.0000 mL | INTRAVENOUS | Status: AC | PRN
  Administered 2019-03-22: 3 mL via INTRAVENOUS
  Filled 2019-03-22: qty 10

## 2019-03-22 MED ORDER — ATORVASTATIN CALCIUM 40 MG PO TABS
40.0000 mg | ORAL_TABLET | Freq: Every day | ORAL | Status: DC
  Administered 2019-03-22 – 2019-03-26 (×5): 40 mg via ORAL
  Filled 2019-03-22 (×5): qty 1

## 2019-03-22 MED ORDER — HYDRALAZINE HCL 50 MG PO TABS: 100.0000 mg | ORAL_TABLET | Freq: Four times a day (QID) | ORAL | Status: DC | PRN

## 2019-03-22 MED ORDER — FUROSEMIDE 10 MG/ML IJ SOLN
100.0000 mg | Freq: Two times a day (BID) | INTRAVENOUS | Status: DC
  Administered 2019-03-22: 100 mg via INTRAVENOUS
  Filled 2019-03-22 (×2): qty 10

## 2019-03-22 MED ORDER — AMIODARONE LOAD VIA INFUSION
150.0000 mg | Freq: Once | INTRAVENOUS | Status: AC
  Administered 2019-03-22: 150 mg via INTRAVENOUS
  Filled 2019-03-22: qty 83.34

## 2019-03-22 MED ORDER — POTASSIUM CHLORIDE CRYS ER 20 MEQ PO TBCR
40.0000 meq | EXTENDED_RELEASE_TABLET | Freq: Once | ORAL | Status: AC
  Administered 2019-03-22: 40 meq via ORAL
  Filled 2019-03-22: qty 2

## 2019-03-22 MED ORDER — ACETAMINOPHEN 325 MG PO TABS: 650.0000 mg | ORAL_TABLET | ORAL | Status: DC | PRN

## 2019-03-22 MED ORDER — POTASSIUM CHLORIDE CRYS ER 20 MEQ PO TBCR: 40.0000 meq | EXTENDED_RELEASE_TABLET | Freq: Once | ORAL | Status: DC

## 2019-03-22 MED ORDER — POTASSIUM CHLORIDE 10 MEQ/100ML IV SOLN: 10.0000 meq | INTRAVENOUS | Status: DC

## 2019-03-22 MED ORDER — DILTIAZEM LOAD VIA INFUSION
15.0000 mg | Freq: Once | INTRAVENOUS | Status: AC
  Administered 2019-03-22: 15 mg via INTRAVENOUS
  Filled 2019-03-22: qty 15

## 2019-03-22 NOTE — ED Provider Notes (Signed)
[facility] DEPT Provider Note   CSN: [id number] Arrival date & time: 03/21/19  2059     History   Chief Complaint Chief Complaint  Patient presents with  . Shortness of Breath  . Leg Swelling    HPI Parker Calderon is a 58 y.o. male.     The history is provided by the patient.  Shortness of Breath Severity:  Severe Onset quality:  Gradual Duration:  7 days Timing:  Constant Progression:  Worsening Chronicity:  New Context: not animal exposure, not emotional upset and not fumes   Relieved by:  Nothing Worsened by:  Nothing Ineffective treatments:  None tried Associated symptoms: no abdominal pain, no chest pain, no cough, no diaphoresis, no fever and no wheezing   Associated symptoms comment:  Abdominal and leg swelling Risk factors: no recent alcohol use, no hx of PE/DVT and no recent surgery   Seen by PMD for same on Wednesday and told to start diltiazem and xarelto based on EKG and did not start it. Now swelling and SOB are worse.    Past Medical History:  Diagnosis Date  . Arthritis    Rhinitis  . Hyperlipidemia   . Irritable bowel syndrome   . Left thigh pain    Lateral, aching  . Obesity, unspecified   . Varicose veins     Patient Active Problem List   Diagnosis Date Noted  . Varicose veins of lower extremities with other complications [id number]  . Chronic venous insufficiency 01/08/2013    History reviewed. No pertinent surgical history.      Home Medications    Prior to Admission medications   Medication Sig Start Date End Date Taking? Authorizing Provider  diltiazem (CARDIZEM CD) 120 MG 24 hr capsule Take 120 mg by mouth daily. 03/15/19  Yes [provider]  Multiple Vitamin (MULTIVITAMIN) tablet Take 1 tablet by mouth daily.   Yes [provider]  rivaroxaban (XARELTO) 20 MG TABS tablet Take 20 mg by mouth daily with supper.   Yes [provider]    Family History No family history  on file.  Social History Social History   Tobacco Use  . Smoking status: Never Smoker  . Smokeless tobacco: Never Used  Substance Use Topics  . Alcohol use: No  . Drug use: No     Allergies   Patient has no known allergies.   Review of Systems Review of Systems  Constitutional: Negative for diaphoresis and fever.  HENT: Negative for congestion.   Eyes: Negative for visual disturbance.  Respiratory: Positive for shortness of breath. Negative for cough and wheezing.   Cardiovascular: Positive for leg swelling. Negative for chest pain.  Gastrointestinal: Positive for abdominal distention. Negative for abdominal pain.  Genitourinary: Negative for difficulty urinating.  Musculoskeletal: Negative for arthralgias.  Neurological: Negative for dizziness.  Psychiatric/Behavioral: Negative for agitation.  All other systems reviewed and are negative.    Physical Exam Updated Vital Signs BP (!) 143/112   Pulse (!) 108   Resp (!) 23   Ht 5\' 11"  (1.803 m)   Wt (!) 166.9 kg   SpO2 98%   BMI 51.33 kg/m   Physical Exam Vitals signs and nursing note reviewed.  Constitutional:      Appearance: He is obese. He is not diaphoretic.  HENT:     Head: Normocephalic and atraumatic.     Nose: Nose normal.  Eyes:     Conjunctiva/sclera: Conjunctivae normal.     Pupils: Pupils  are equal, round, and reactive to light.  Neck:     Musculoskeletal: Normal range of motion and neck supple.  Cardiovascular:     Rate and Rhythm: Tachycardia present. Rhythm irregular.     Pulses: Normal pulses.     Heart sounds: Normal heart sounds.  Pulmonary:     Effort: Pulmonary effort is normal.     Breath sounds: Decreased air movement present.  Abdominal:     General: Abdomen is flat. Bowel sounds are normal.     Palpations: There is fluid wave.     Tenderness: There is no abdominal tenderness. There is no guarding.  Musculoskeletal:     Right lower leg: Edema present.     Left lower leg: Edema  present.  Skin:    General: Skin is warm and dry.     Capillary Refill: Capillary refill takes less than 2 seconds.  Neurological:     General: No focal deficit present.     Mental Status: He is alert and oriented to person, place, and time.     Deep Tendon Reflexes: Reflexes normal.  Psychiatric:        Mood and Affect: Mood normal.        Behavior: Behavior normal.      ED Treatments / Results  Labs (all labs ordered are listed, but only abnormal results are displayed) Results for orders placed or performed during the hospital encounter of 03/22/19  SARS Coronavirus 2 by RT PCR (hospital order, performed in [place] hospital lab) Nasopharyngeal Nasopharyngeal Swab   Specimen: Nasopharyngeal Swab  Result Value Ref Range   SARS Coronavirus 2 NEGATIVE NEGATIVE  Basic metabolic panel  Result Value Ref Range   Sodium 141 135 - 145 mmol/L   Potassium 3.3 (L) 3.5 - 5.1 mmol/L   Chloride 106 98 - 111 mmol/L   CO2 26 22 - 32 mmol/L   Glucose, Bld 112 (H) 70 - 99 mg/dL   BUN 16 6 - 20 mg/dL   Creatinine, Ser 0.90 0.61 - 1.24 mg/dL   Calcium 8.6 (L) 8.9 - 10.3 mg/dL   GFR calc non Af Amer >60 >60 mL/min   GFR calc Af Amer >60 >60 mL/min   Anion gap 9 5 - 15  CBC  Result Value Ref Range   WBC 12.1 (H) 4.0 - 10.5 K/uL   RBC 4.60 4.22 - 5.81 MIL/uL   Hemoglobin 14.6 13.0 - 17.0 g/dL   HCT 46.1 39.0 - 52.0 %   MCV 100.2 (H) 80.0 - 100.0 fL   MCH 31.7 26.0 - 34.0 pg   MCHC 31.7 30.0 - 36.0 g/dL   RDW 14.3 11.5 - 15.5 %   Platelets 277 150 - 400 K/uL   nRBC 0.0 0.0 - 0.2 %  Brain natriuretic peptide  Result Value Ref Range   B Natriuretic Peptide 261.6 (H) 0.0 - 100.0 pg/mL  APTT  Result Value Ref Range   aPTT 35 24 - 36 seconds  Protime-INR  Result Value Ref Range   Prothrombin Time 18.2 (H) 11.4 - 15.2 seconds   INR 1.5 (H) 0.8 - 1.2  Heparin level (unfractionated)  Result Value Ref Range   Heparin Unfractionated >2.20 (H) 0.30 - 0.70 IU/mL  Troponin I (High  Sensitivity)  Result Value Ref Range   Troponin I (High Sensitivity) 28 (H) <18 ng/L   Dg Chest 2 View  Result Date: 03/21/2019 CLINICAL DATA:  Feet swelling EXAM: CHEST - 2 VIEW COMPARISON:  None. FINDINGS:  Heart is borderline in size. Mild vascular congestion. No overt edema. No confluent opacities or effusions. No acute bony abnormality. IMPRESSION: Borderline heart size.  Mild vascular congestion. Electronically Signed   By: Rolm Baptise M.D.   On: 03/21/2019 21:56    EKG EKG Interpretation  Date/Time:  Monday March 22 2019 01:24:52 EST Ventricular Rate:  127 PR Interval:    QRS Duration: 94 QT Interval:  353 QTC Calculation: 514 R Axis:   89 Text Interpretation: Atrial fibrillation Prolonged QT interval Confirmed by Dory Horn) on 03/22/2019 1:46:16 AM   Radiology Dg Chest 2 View  Result Date: 03/21/2019 CLINICAL DATA:  Feet swelling EXAM: CHEST - 2 VIEW COMPARISON:  None. FINDINGS: Heart is borderline in size. Mild vascular congestion. No overt edema. No confluent opacities or effusions. No acute bony abnormality. IMPRESSION: Borderline heart size.  Mild vascular congestion. Electronically Signed   By: Rolm Baptise M.D.   On: 03/21/2019 21:56    Procedures Procedures (including critical care time)  Medications Ordered in ED Medications  diltiazem (CARDIZEM) 1 mg/mL load via infusion 15 mg (15 mg Intravenous Bolus from Bag 03/22/19 0120)    And  diltiazem (CARDIZEM) 125 mg in dextrose 5% 125 mL (1 mg/mL) infusion (12.5 mg/hr Intravenous Rate/Dose Change 03/22/19 0309)  heparin ADULT infusion 100 units/mL (25000 units/272mL sodium chloride 0.45%) (has no administration in time range)  furosemide (LASIX) injection 40 mg (has no administration in time range)  sodium chloride flush (NS) 0.9 % injection 3 mL (3 mLs Intravenous Given 03/22/19 0130)    MDM Interpretation: labs, ECG and x-ray (elevated BNP and troponin and chf by me on cxr) Total time providing  critical care: 75-105 minutes (diltizem drip and heparin started secondar to the fact that it is unclear when he entered AFIB likely prior to wednesday given the history. ). This excludes time spent performing separately reportable procedures and services. Consults: admitting MD  CRITICAL CARE Performed by: Braylynn Ghan K Sabastien Tyler-Rasch Total critical care time: 75 minutes Critical care time was exclusive of separately billable procedures and treating other patients. Critical care was necessary to treat or prevent imminent or life-threatening deterioration. Critical care was time spent personally by me on the following activities: development of treatment plan with patient and/or surrogate as well as nursing, discussions with consultants, evaluation of patient's response to treatment, examination of patient, obtaining history from patient or surrogate, ordering and performing treatments and interventions, ordering and review of laboratory studies, ordering and review of radiographic studies, pulse oximetry and re-evaluation of patient's condition.   Final Clinical Impressions(s) / ED Diagnoses   Final diagnoses:  Anasarca  Atrial fibrillation, unspecified type [facility])    Admit to medicine   Macrae Wiegman, MD 03/22/19 [phone]

## 2019-03-22 NOTE — Progress Notes (Signed)
  Echocardiogram 2D Echocardiogram has been performed.  Parker Calderon G Parker Calderon 03/22/2019, 4:07 PM

## 2019-03-22 NOTE — Progress Notes (Signed)
ANTICOAGULATION CONSULT NOTE - Initial Consult  Pharmacy Consult for IV heparin Indication: atrial fibrillation  No Known Allergies  Patient Measurements:   Heparin Dosing Weight: 102 kg  Vital Signs: BP: 152/132 (11/02 0035) Pulse Rate: 135 (11/02 0031)  Labs: Recent Labs    03/21/19 2205  HGB 14.6  HCT 46.1  PLT 277  CREATININE 0.90    CrCl cannot be calculated (Unknown ideal weight.).   Medical History: Past Medical History:  Diagnosis Date  . Arthritis    Rhinitis  . Hyperlipidemia   . Irritable bowel syndrome   . Left thigh pain    Lateral, aching  . Obesity, unspecified   . Varicose veins     Medications:  Scheduled:  . diltiazem  15 mg Intravenous Once  . sodium chloride flush  3 mL Intravenous Once   Infusions:  . diltiazem (CARDIZEM) infusion    . heparin      Assessment: 38 yoM c/o SOB and edema in abd and bilateral feet. States started on diltiazem and Xarelto on 11/1. Was told to come to ED for increased swelling.  LD xarelto 11/1 0730. Will use aptt to adjust heparin d/t recent xarelto use.  Baseline labs: H/H = 14.6/46.1, plts = 277, aptt = 35 and HL >2.20  Goal of Therapy:  aptt = 66-102 sec Heparin level 0.3-0.7 units/ml Monitor platelets by anticoagulation protocol: Yes   Plan:  Baseline Ht/Wt, aptt, INR and HL STAT At 0600 start heparin drip at 1500 units/hr.  Daily CBC/HL Check 1st aptt 6 hours after drip started  Dorrene German 03/22/2019,1:02 AM

## 2019-03-22 NOTE — H&P (Signed)
History and Physical    Parker Calderon [id number] DOB: [birth year] DOA: 03/22/2019  PCP: Idelia Salm, PA-C (Inactive) Patient coming from: Home  Chief Complaint: Shortness of breath, peripheral edema  HPI: Parker Calderon is a 58 y.o. male with medical history significant of arthritis, hyperlipidemia, obesity presenting to the ED with complaints of shortness of breath and peripheral edema.  Patient reports having dyspnea on exertion for a while which has been worse recently.  For the past few days he has noticed that both of his legs are swollen and has abdomen appears tight and swollen.  No vomiting or diarrhea.  It has been difficult for him to breathe when laying down flat for the past few days.  He believes he has gained weight recently without any changes to his appetite.  Denies heart palpitations or chest pain.  States he was recently seen by his PCP about 4 days ago and started on diltiazem and Xarelto because his EKG was abnormal.  States he started taking these medications yesterday morning and has only taken 1 dose of each so far.  ED Course: Found to be in A. fib with RVR with rate up to 140s.  Tachypneic.  Not hypoxic.  White blood cell count 12.1.  Potassium 3.3.  High-sensitivity troponin 28.  BNP 261.  SARS-CoV-2 test negative.  Chest x-ray showing borderline cardiomegaly and mild vascular congestion.  No overt edema. Patient received IV Cardizem loading dose and was started on infusion.  Received heparin loading dose and started on infusion.  Received IV Lasix 40 mg.  Review of Systems:  All systems reviewed and apart from history of presenting illness, are negative.  Past Medical History:  Diagnosis Date  . Arthritis    Rhinitis  . Hyperlipidemia   . Irritable bowel syndrome   . Left thigh pain    Lateral, aching  . Obesity, unspecified   . Varicose veins     History reviewed. No pertinent surgical history.   reports that he has never smoked. He has never used  smokeless tobacco. He reports that he does not drink alcohol or use drugs.  No Known Allergies  History reviewed. No pertinent family history.  Prior to Admission medications   Medication Sig Start Date End Date Taking? Authorizing Provider  diltiazem (CARDIZEM CD) 120 MG 24 hr capsule Take 120 mg by mouth daily. 03/15/19  Yes [provider]  Multiple Vitamin (MULTIVITAMIN) tablet Take 1 tablet by mouth daily.   Yes [provider]  rivaroxaban (XARELTO) 20 MG TABS tablet Take 20 mg by mouth daily with supper.   Yes [provider]    Physical Exam: Vitals:   03/22/19 0530 03/22/19 0545 03/22/19 0600 03/22/19 0615  BP: (!) 174/115  (!) 164/137   Pulse: 69 (!) 112 (!) 142 (!) 144  Resp: (!) 22 (!) 22 17 18   SpO2: 94% 97% 96% 96%  Weight:      Height:        Physical Exam  Constitutional: He is oriented to person, place, and time. He appears well-developed and well-nourished. No distress.  HENT:  Head: Normocephalic.  Eyes: Right eye exhibits no discharge. Left eye exhibits no discharge.  Neck: Neck supple.  Cardiovascular: Normal rate, regular rhythm and intact distal pulses.  Pulmonary/Chest: Effort normal. No respiratory distress. He has no wheezes.  Equal air entry bilaterally.  Examination limited secondary to patient's large body habitus.  Abdominal: Soft. Bowel sounds are normal. He exhibits distension. There is  no abdominal tenderness. There is no guarding.  Musculoskeletal:        General: Edema present.     Comments: +3 pitting edema of bilateral lower extremities  Neurological: He is alert and oriented to person, place, and time.  Skin: Skin is warm and dry. He is not diaphoretic.     Labs on Admission: I have personally reviewed following labs and imaging studies  CBC: Recent Labs  Lab 03/21/19 2205  WBC 12.1*  HGB 14.6  HCT 46.1  MCV 100.2*  PLT 99991111   Basic Metabolic Panel: Recent Labs  Lab 03/21/19 2205  NA 141  K 3.3*   CL 106  CO2 26  GLUCOSE 112*  BUN 16  CREATININE 0.90  CALCIUM 8.6*   GFR: Estimated Creatinine Clearance: 139.9 mL/min (by C-G formula based on SCr of 0.9 mg/dL). Liver Function Tests: No results for input(s): AST, ALT, ALKPHOS, BILITOT, PROT, ALBUMIN in the last 168 hours. No results for input(s): LIPASE, AMYLASE in the last 168 hours. No results for input(s): AMMONIA in the last 168 hours. Coagulation Profile: Recent Labs  Lab 03/21/19 2205  INR 1.5*   Cardiac Enzymes: No results for input(s): CKTOTAL, CKMB, CKMBINDEX, TROPONINI in the last 168 hours. BNP (last 3 results) No results for input(s): PROBNP in the last 8760 hours. HbA1C: No results for input(s): HGBA1C in the last 72 hours. CBG: No results for input(s): GLUCAP in the last 168 hours. Lipid Profile: No results for input(s): CHOL, HDL, LDLCALC, TRIG, CHOLHDL, LDLDIRECT in the last 72 hours. Thyroid Function Tests: No results for input(s): TSH, T4TOTAL, FREET4, T3FREE, THYROIDAB in the last 72 hours. Anemia Panel: No results for input(s): VITAMINB12, FOLATE, FERRITIN, TIBC, IRON, RETICCTPCT in the last 72 hours. Urine analysis: No results found for: COLORURINE, APPEARANCEUR, LABSPEC, [place], GLUCOSEU, HGBUR, BILIRUBINUR, KETONESUR, PROTEINUR, UROBILINOGEN, NITRITE, LEUKOCYTESUR  Radiological Exams on Admission: Dg Chest 2 View  Result Date: 03/21/2019 CLINICAL DATA:  Feet swelling EXAM: CHEST - 2 VIEW COMPARISON:  None. FINDINGS: Heart is borderline in size. Mild vascular congestion. No overt edema. No confluent opacities or effusions. No acute bony abnormality. IMPRESSION: Borderline heart size.  Mild vascular congestion. Electronically Signed   By: Rolm Baptise M.D.   On: 03/21/2019 21:56    EKG: Independently reviewed.  Atrial fibrillation, heart rate 141.  Baseline wander.  QTc 527.  No prior EKG for comparison.  Assessment/Plan Principal Problem:   Atrial fibrillation with rapid ventricular response  (HCC) Active Problems:   Volume overload   Elevated blood pressure reading   HLD (hyperlipidemia)   Prolonged QT interval   New onset A. fib with RVR Likely precipitated by new onset CHF.  CHA2DS2VASc 1 based on elevated blood pressure, although no documented history of hypertension.  Patient states he was started on diltiazem and Xarelto by his PCP 4 days ago but has taken only 1 dose so far.  Found to be in A. fib with RVR with rate up to 140s on arrival to the ED. Blood pressure slightly elevated. -Cardiac monitoring -Continue Cardizem infusion -Continue heparin infusion.  Consult cardiology in a.m. to discuss need for lifelong anticoagulation. -Check TSH and free T4 levels -Echocardiogram  Volume overload, suspect new onset CHF Patient is presenting with complaints of dyspnea on exertion, orthopnea, abdominal distention, and bilateral lower extremity edema.  Slightly tachypneic but not hypoxic.  BNP 261, likely falsely low given morbid obesity based on BMI 51.3.  Chest x-ray showing borderline cardiomegaly and mild vascular congestion.  No overt edema. -Cardiac monitoring -Received IV Lasix 40 mg in the ED.  Continue IV Lasix 40 mg twice daily. -Monitor intake and output, daily weights, low-sodium diet with fluid restriction -Echocardiogram -Continue to monitor renal function  Elevated blood pressure Blood pressure elevated.  No documented history of hypertension in the chart. -Continue Cardizem infusion at this time and monitor blood pressure closely  History of hyperlipidemia Currently not on a statin. -Check lipid panel  Mild leukocytosis Possibly reactive.  White blood cell count 12.1.  No temperature reading recorded in the ED.  Patient is nontoxic-appearing.  Chest x-ray without evidence of pneumonia. -Check temperature -Urinalysis  Mild hypokalemia Potassium 3.3.   -Replete potassium.  Check magnesium level and replete if low.  Continue to monitor BMP.  Mild  troponinemia Likely due to demand ischemia from A. fib with RVR and new onset CHF.  High-sensitivity troponin 28 >25.  EKG not suggestive of ACS.  Patient is not having chest pain. -Cardiac monitoring  QT prolongation on EKG -Cardiac monitoring -Keep potassium above 4 and magnesium above 2 -Repeat EKG in a.m. -Avoid QT prolonging drugs if possible  HIV screening The patient falls between the ages of 13-64 and should be screened for HIV, therefore HIV testing ordered.  DVT prophylaxis: Heparin Code Status: Full code Family Communication: No family available. Disposition Plan: Anticipate discharge after clinical improvement. Consults called: None Admission status: It is my clinical opinion that admission to INPATIENT is reasonable and necessary in this 58 y.o. male . presenting with new onset A. fib with RVR and volume overload secondary to suspected new onset CHF.  Has significant peripheral edema.  Will need IV Lasix for several days.  Currently on Cardizem infusion for rate control and heparin infusion for anticoagulation.  Given the aforementioned, the predictability of an adverse outcome is felt to be significant. I expect that the patient will require at least 2 midnights in the hospital to treat this condition.   The medical decision making on this patient was of high complexity and the patient is at high risk for clinical deterioration, therefore this is a level 3 visit.  Shela Leff MD [facility] Pager [phone]  If 7PM-7AM, please contact night-coverage [email or web address] Password [facility]  03/22/2019, 6:25 AM

## 2019-03-22 NOTE — Consult Note (Signed)
Cardiology Consultation:   Patient ID: Parker Calderon; [id number]; Sep 20, 1960   Admit date: 03/22/2019 Date of Consult: 03/22/2019  Primary Care Provider: Idelia Salm, PA-C (Inactive) Primary Cardiologist: New to [facility]; Dr. Audie Box Primary Electrophysiologist:  None   Patient Profile:   Parker Calderon is a 58 y.o. male with a PMH of recently diagnosed atrial fibrillation, morbid obesity, and arthritis, who is being seen today for the evaluation of atrial fibrillation and CHF at the request of Dr. Marthenia Rolling.  History of Present Illness:   [name] was in his usual state of health until a few day sago when he noticed LE edema, abdominal bloating, and SOB. He reports he was seen by his PCP earlier that week with complaints of abdominal bloating and was started on diltiazem and xarelto for an "abnormal EKG", presumably atrial fibrillation. He reports he did not take these medications until 03/21/2019 in the AM. He felt well until he woke up from a nap in the afternoon with SOB and PND, prompting him to present to the ED for further evaluation.  He does not follow with a cardiologist and has had no prior cardiac work-up. He reports BP is frequently elevated at PCP visit but he has not been formally diagnosed with HTN. He denies history of HLD or DM type 2. 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 He denies tobacco, ETOH, or illicit drug use. He denies family history of CAD.  At the time of this evaluation he reports improvement in his abdominal bloating. He [place] unaware of his heart racing. He denies any history of chest pain. Over the past week he has noticed some DOE. When asked about his diet her reports doing most of his own cooking but does pick up [facility] and [employer] from time to time. We discussed the importance of limiting salt going forward. He has never had a sleep study and denies nighttime somnolence (3rd shift worker), PND, or snoring, though I suspect he has OSA. He denies  dizziness, lightheadedness, syncope, palpitations, or problems with bleeding.   Hospital course: Tachycardic to the 140s, hypertensive (max 169/136), intermittently tachypneic, otherwise VSS. Labs notable for K 3.3>3.0, Mg 2.0, Cr 0.9, WBC 12, Hgb 14.6, PLT 277, Trop 28>25, BNP 261, TSH wnl. COVID-19 negative. CXR with borderline cardiomegaly and mild vascular congestion. EKG with atrial fibrillation with RVR, rate 141 with non-specific T wave abnormalities (no comparison); repeat EKG with improvement in rate to 125. He was started on a diltiazem gtt for rate control and heparin gtt for stroke ppx for management of atrial fibrillation. He was given 40mg  IV lasix x2 doses with plans to continue BID for management of acute CHF. Cardiology asked to evaluate for atrial fibrillation and CHF.   Past Medical History:  Diagnosis Date  . Arthritis    Rhinitis  . Hyperlipidemia   . Irritable bowel syndrome   . Left thigh pain    Lateral, aching  . Obesity, unspecified   . Varicose veins     History reviewed. No pertinent surgical history.   Home Medications:  Prior to Admission medications   Medication Sig Start Date End Date Taking? Authorizing Provider  diltiazem (CARDIZEM CD) 120 MG 24 hr capsule Take 120 mg by mouth daily. 03/15/19  Yes [provider]  Multiple Vitamin (MULTIVITAMIN) tablet Take 1 tablet by mouth daily.   Yes [provider]  rivaroxaban (XARELTO) 20 MG TABS tablet Take 20 mg by mouth daily with supper.  Yes [provider]    Inpatient Medications: Scheduled Meds: . atorvastatin  40 mg Oral q1800  . Chlorhexidine Gluconate Cloth  6 each Topical Daily  . furosemide  40 mg Intravenous BID   Continuous Infusions: . diltiazem (CARDIZEM) infusion 15 mg/hr (03/22/19 1004)  . heparin 1,500 Units/hr (03/22/19 0358)   PRN Meds: acetaminophen  Allergies:   No Known Allergies  Social History:   Social History   Socioeconomic History  .  Marital status: Single    Spouse name: Not on file  . Number of children: Not on file  . Years of education: Not on file  . Highest education level: Not on file  Occupational History  . Not on file  Social Needs  . Financial resource strain: Not on file  . Food insecurity    Worry: Not on file    Inability: Not on file  . Transportation needs    Medical: Not on file    Non-medical: Not on file  Tobacco Use  . Smoking status: Never Smoker  . Smokeless tobacco: Never Used  Substance and Sexual Activity  . Alcohol use: No  . Drug use: No  . Sexual activity: Not on file  Lifestyle  . Physical activity    Days per week: Not on file    Minutes per session: Not on file  . Stress: Not on file  Relationships  . Social Herbalist on phone: Not on file    Gets together: Not on file    Attends religious service: Not on file    Active member of club or organization: Not on file    Attends meetings of clubs or organizations: Not on file    Relationship status: Not on file  . Intimate partner violence    Fear of current or ex partner: Not on file    Emotionally abused: Not on file    Physically abused: Not on file    Forced sexual activity: Not on file  Other Topics Concern  . Not on file  Social History Narrative  . Not on file    Family History:    Family History  Problem Relation Age of Onset  . High blood pressure Mother   . Kidney failure Father      ROS:  Please see the history of present illness.   All other ROS reviewed and negative.     Physical Exam/Data:   Vitals:   03/22/19 0655 03/22/19 0656 03/22/19 0730 03/22/19 0800  BP:  (!) 159/123 (!) 158/115 (!) 146/113  Pulse: (!) 121 95 95   Resp: (!) 24 19 (!) 21 (!) 29  Temp: 97.9 F (36.6 C)     TempSrc: Oral     SpO2:  97% 97%   Weight:      Height:        Intake/Output Summary (Last 24 hours) at 03/22/2019 1211 Last data filed at 03/22/2019 1206 Gross per 24 hour  Intake 720 ml  Output  700 ml  Net 20 ml   Filed Weights   03/22/19 0127  Weight: (!) 166.9 kg   Body mass index is 51.33 kg/m.  General:  Morbidly obese gentleman laying in bed in no acute distress HEENT: sclera anicteric  Neck: difficult to assess JVD given body habitus Vascular: No carotid bruits; distal pulses 2+ bilaterally Cardiac:  normal S1, S2; IRIR; no murmurs, rubs, or gallops Lungs:  clear to auscultation bilaterally, no wheezing, rhonchi or rales  Abd: NABS, obese, distended, no hepatomegaly Ext: 1-2+ LE edema Musculoskeletal:  No deformities, BUE and BLE strength normal and equal Skin: warm and dry  Neuro:  CNs 2-12 intact, no focal abnormalities noted Psych:  Normal affect   EKG:  The EKG was personally reviewed and demonstrates:  atrial fibrillation with RVR, rate 141 with non-specific T wave abnormalities (no comparison); repeat EKG with improvement in rate to 125 Telemetry:  Telemetry was personally reviewed and demonstrates:  Atrial fibrillation with RVR, rates persistently in the 120s  Relevant CV Studies: Echocardiogram 03/22/2019: pending  Laboratory Data:  Chemistry Recent Labs  Lab 03/21/19 2205 03/22/19 0642  NA 141 143  K 3.3* 3.0*  CL 106 104  CO2 26 28  GLUCOSE 112* 106*  BUN 16 15  CREATININE 0.90 0.88  CALCIUM 8.6* 8.9  GFRNONAA >60 >60  GFRAA >60 >60  ANIONGAP 9 11    No results for input(s): PROT, ALBUMIN, AST, ALT, ALKPHOS, BILITOT in the last 168 hours. Hematology Recent Labs  Lab 03/21/19 2205 03/22/19 0642  WBC 12.1* 12.2*  RBC 4.60 4.66  HGB 14.6 14.6  HCT 46.1 46.9  MCV 100.2* 100.6*  MCH 31.7 31.3  MCHC 31.7 31.1  RDW 14.3 14.6  PLT 277 285   Cardiac EnzymesNo results for input(s): TROPONINI in the last 168 hours. No results for input(s): TROPIPOC in the last 168 hours.  BNP Recent Labs  Lab 03/21/19 2245  BNP 261.6*    DDimer No results for input(s): DDIMER in the last 168 hours.  Radiology/Studies:  Dg Chest 2 View  Result  Date: 03/21/2019 CLINICAL DATA:  Feet swelling EXAM: CHEST - 2 VIEW COMPARISON:  None. FINDINGS: Heart is borderline in size. Mild vascular congestion. No overt edema. No confluent opacities or effusions. No acute bony abnormality. IMPRESSION: Borderline heart size.  Mild vascular congestion. Electronically Signed   By: Rolm Baptise M.D.   On: 03/21/2019 21:56    Assessment and Plan:   1. Atrial fibrillation with RVR: recently diagnosed by PCP and started on diltiazem 120mg  daily and xarelto 20mg  daily. He subsequently developed LE edema, abdominal bloating, and SOB prompting his presentation to the ED. He was found to be in atrial fibrillation with RVR with rates up to 140s. Started on a diltiazem gtt with rates persistently in the 120s. TSH wnl. WBC elevated but no clear infectious source; COVID-19 negative. Risk factors for CAD include undiagnosed HTN, HLD (not on medications), and obesity.   - Will follow-up Echo - Continue diltiazem gtt for now for rate control - hopeful this will improve with diuresis below - Will addon HgbA1C for risk stratification - This patients CHA2DS2-VASc Score and unadjusted Ischemic Stroke Rate (% per year) is equal to at least 2.2 % stroke rate/year from a score of 2 Above score calculated as 1 point each if present [CHF, HTN, DM, Vascular=MI/PAD/Aortic Plaque, Age if 65-74, or Male] Above score calculated as 2 points each if present [Age > 75, or Stroke/TIA/TE] - Continue heparin gtt for now - anticipate transition to eliquis 5mg  BID prior to discharge - Will consider TEE/DCCV if patient does not convert to NSR - Anticipate outpatient NST to evaluate for ischemia  2. Acute CHF: patient presented with SOB, LE edema, and abdominal bloating for the past several days. BNP elevated to 261. CXR with mild vascular congestion. Recently diagnosed with Afib with persistent RVR this admission. Echo pending. Possible this is tachycardia and/or HTN mediated. No history of CAD  or symptoms to suggest unstable angina. He was started on lasix 40mg  BID (received 80mg  this admission), with incomplete I&Os and no weight trend at this point. Anticipate starting BBlocker and ARB for management of CHF and HTN prior to discharge.  - Further work-up pending echo results - Will increase lasix to 100mg  BID  - Continue to monitor strict I&Os and daily weights - Continue to monitor electrolytes closely and replete to maintain K >4, Mg >2 - repleted 80 mEq this AM. Afternoon BMET ordered for close monitoring.  - Low sodium diet.   3. Hypertensive emergency: new diagnosis. BP significantly elevated this admission despite diltiazem gtt.  - Will start prn hydralazine for now pending ongoing work-up for Afib and CHF  4. HLD: LDL 119 this admission. Not on cholesterol medications  - Will start atorvastatin 40mg  daily  5. Suspected OSA: patient morbidly obese. Possible this is contributing to #1. - Recommend an outpatient sleep study after discharge.    For questions or updates, please contact [place] Please consult [email or web address] for contact info under Cardiology/STEMI.   Signed, Abigail Butts, PA-C  03/22/2019 12:11 PM [phone]

## 2019-03-22 NOTE — Progress Notes (Signed)
-  Patient was admitted earlier today. -Patient is a 58 year old African-American male, morbidly obese, with past medical history significant for hypertension, congestive heart failure (type unknown) and morbid obesity.  Patient may have previously undiagnosed OSA/OHS.  Patient was recently found to have atrial fibrillation/RVR. -Patient was seen by the PCP recently and prescribed DOAC and Cardizem. -According to the patient, he took the first dose of Cardizem yesterday. -Patient presents with volume overload, CHF with exacerbation, atrial fibrillation with rapid ventricular response. -Patient is currently on Cardizem and diuretics. -Cardiology team consulted. -Discussed need for sleep studies on discharge. -Discussed need to lose weight. -Further management depend on hospital course.

## 2019-03-22 NOTE — ED Notes (Signed)
Called phlebotomy and requested they collect pt morning labs. Pt has cardizem running in left arm and heparin in right arm. ED provider said these IVs cannot be stopped to draw labs.

## 2019-03-22 NOTE — Progress Notes (Signed)
Update:   EF ~15% on my review. Will stop diltiazem and start amiodarone drip. Continue heparin infusion. Will start afterload reduction with aldactone 25 mg QD and bidil (20 mg-37.5 mg) 2 tablets TID for afterload reduction. Will continue with aggressive diuresis. Will recheck BMP and needs aggressive potassium replacement.   [place] T. Audie Box, [place]  [address] [place], [place] [address] [phone]  5:52 PM

## 2019-03-22 NOTE — Progress Notes (Signed)
  Amiodarone Drug - Drug Interaction Consult Note  Recommendations:  Amiodarone is metabolized by the cytochrome P450 system and therefore has the potential to cause many drug interactions. Amiodarone has an average plasma half-life of 50 days (range 20 to 100 days).   There is potential for drug interactions to occur several weeks or months after stopping treatment and the onset of drug interactions may be slow after initiating amiodarone.   [x]  Statins: Increased risk of myopathy. Simvastatin- restrict dose to 20mg  daily. Other statins: counsel patients to report any muscle pain or weakness immediately.  []  Anticoagulants: Amiodarone can increase anticoagulant effect. Consider warfarin dose reduction. Patients should be monitored closely and the dose of anticoagulant altered accordingly, remembering that amiodarone levels take several weeks to stabilize.  []  Antiepileptics: Amiodarone can increase plasma concentration of phenytoin, the dose should be reduced. Note that small changes in phenytoin dose can result in large changes in levels. Monitor patient and counsel on signs of toxicity.  []  Beta blockers: increased risk of bradycardia, AV block and myocardial depression. Sotalol - avoid concomitant use.  []   Calcium channel blockers (diltiazem and verapamil): increased risk of bradycardia, AV block and myocardial depression.  []   Cyclosporine: Amiodarone increases levels of cyclosporine. Reduced dose of cyclosporine is recommended.  []  Digoxin dose should be halved when amiodarone is started.  [x]  Diuretics: increased risk of cardiotoxicity if hypokalemia occurs.  []  Oral hypoglycemic agents (glyburide, glipizide, glimepiride): increased risk of hypoglycemia. Patient's glucose levels should be monitored closely when initiating amiodarone therapy.   []  Drugs that prolong the QT interval:  Torsades de pointes risk may be increased with concurrent use - avoid if possible.  Monitor QTc,  also keep magnesium/potassium WNL if concurrent therapy can't be avoided. Mar[place] Kitchen Antibiotics: e.g. fluoroquinolones, erythromycin. . Antiarrhythmics: e.g. quinidine, procainamide, disopyramide, sotalol. . Antipsychotics: e.g. phenothiazines, haloperidol.  . Lithium, tricyclic antidepressants, and methadone. Thank You,   Eudelia Bunch, Pharm.D [phone] 03/22/2019 6:09 PM

## 2019-03-22 NOTE — Progress Notes (Signed)
ANTICOAGULATION CONSULT NOTE - Follow Up Consult  Pharmacy Consult for IV heparin Indication: new onset atrial fibrillation  No Known Allergies  Patient Measurements: Height: 5\' 11"  (180.3 cm) Weight: (!) 368 lb (166.9 kg) IBW/kg (Calculated) : 75.3 Heparin Dosing Weight: 116 kg  Vital Signs: Temp: 97.9 F (36.6 C) (11/02 1600) Temp Source: Oral (11/02 1600) BP: 146/97 (11/02 1858) Pulse Rate: 91 (11/02 1858)  Labs: Recent Labs    03/21/19 2205 03/22/19 0131 03/22/19 0334 03/22/19 0642 03/22/19 1208 03/22/19 1837  HGB 14.6  --   --  14.6  --   --   HCT 46.1  --   --  46.9  --   --   PLT 277  --   --  285  --   --   APTT 35  --   --  37* 59* 90*  LABPROT 18.2*  --   --   --   --   --   INR 1.5*  --   --   --   --   --   HEPARINUNFRC  --  >2.20*  --   --   --   --   CREATININE 0.90  --   --  0.88 0.96 0.97  TROPONINIHS 28*  --  25*  --   --   --     Estimated Creatinine Clearance: 129.8 mL/min (by C-G formula based on SCr of 0.97 mg/dL).   Medications:  - Recently prescribed xarelto 20 mg daily by PCP for a-fib (took first dose on 11/1 at 0730 PTA)  Assessment: Patient is a 58 y.o M recently diagnosed with a-fib by his PCP and was placed on xarelto and diltiazem. He presented to the ED on 11/1 with c/o SOB and peripheral edema and was found to be in a-fib with RVR. He was transitioned to IV heparin on admission.  Today, 03/22/2019: - Evening aPTT is now therapeutic at 90 seconds after rate increase earlier today - Note that recent xarelto can falsely elevate heparin levels. Therefore, will titrate/adjust heparin infusion based on aPTT for now until heparin level correlates with aPTT.  - CBC stable - No bleeding or infusion issues noted per nursing  Goal of Therapy:  Heparin level 0.3-0.7 units/ml  aPTT 66-102 seconds Monitor platelets by anticoagulation protocol: Yes   Plan:  - Continue heparin infusion at 1700 units/hr - Check aPTT, heparin level in 6  hours - Daily CBC, heparin level - Monitor closely for s/sx of bleeding   Lindell Spar, PharmD, BCPS Clinical Pharmacist  03/22/2019,7:20 PM

## 2019-03-22 NOTE — Progress Notes (Signed)
ANTICOAGULATION CONSULT NOTE - Follow Up Consult  Pharmacy Consult for heparin Indication: new onset atrial fibrillation  No Known Allergies  Patient Measurements: Height: 5\' 11"  (180.3 cm) Weight: (!) 368 lb (166.9 kg) IBW/kg (Calculated) : 75.3 Heparin Dosing Weight: 116 kg  Vital Signs: Temp: 97.9 F (36.6 C) (11/02 0655) Temp Source: Oral (11/02 0655) BP: 146/113 (11/02 0800) Pulse Rate: 95 (11/02 0730)  Labs: Recent Labs    03/21/19 2205 03/22/19 0131 03/22/19 0334 03/22/19 0642  HGB 14.6  --   --  14.6  HCT 46.1  --   --  46.9  PLT 277  --   --  285  APTT 35  --   --  37*  LABPROT 18.2*  --   --   --   INR 1.5*  --   --   --   HEPARINUNFRC  --  >2.20*  --   --   CREATININE 0.90  --   --  0.88  TROPONINIHS 28*  --  25*  --     Estimated Creatinine Clearance: 143.1 mL/min (by C-G formula based on SCr of 0.88 mg/dL).   Medications:  - Recently prescribed xarelto 20 mg daily by PCP for afib (took first dose on 11/1 at 0730 PTA)  Assessment: Patient is a 58 y.o M recently diagnosed with afib by his PCP and was placed on xarelto and diltiazem.  He presented to t he ED on 11/1 with c/o SOB and peripheral edema and was found to be in afib with RVR.  He was transitioned to heparin on admission.  Today, 03/22/2019: - aPTT is sub-therapeutic at 59 secs; will monitor with aPTT for now until heparin level correlates with aPTT - cbc stable -  no bleeding documented  Goal of Therapy:  Heparin level 0.3-0.7 units/ml Monitor platelets by anticoagulation protocol: Yes   Plan:  - increase heparin drip drip to 1700 units/hr - check 6 hr aPTT - monitor for s/s bleeding  Keyaan Lederman P 03/22/2019,8:51 AM

## 2019-03-22 NOTE — ED Notes (Signed)
Called lab to add on new orders from previously drawn blood work.

## 2019-03-23 DIAGNOSIS — I5021 Acute systolic (congestive) heart failure: Secondary | ICD-10-CM

## 2019-03-23 LAB — CBC WITH DIFFERENTIAL/PLATELET
Abs Immature Granulocytes: 0.13 10*3/uL — ABNORMAL HIGH (ref 0.00–0.07)
Basophils Absolute: 0.1 10*3/uL (ref 0.0–0.1)
Basophils Relative: 0 %
Eosinophils Absolute: 0.1 10*3/uL (ref 0.0–0.5)
Eosinophils Relative: 1 %
HCT: 43 % (ref 39.0–52.0)
Hemoglobin: 13.5 g/dL (ref 13.0–17.0)
Immature Granulocytes: 1 %
Lymphocytes Relative: 18 %
Lymphs Abs: 2.1 10*3/uL (ref 0.7–4.0)
MCH: 31.4 pg (ref 26.0–34.0)
MCHC: 31.4 g/dL (ref 30.0–36.0)
MCV: 100 fL (ref 80.0–100.0)
Monocytes Absolute: 1.2 10*3/uL — ABNORMAL HIGH (ref 0.1–1.0)
Monocytes Relative: 10 %
Neutro Abs: 8 10*3/uL — ABNORMAL HIGH (ref 1.7–7.7)
Neutrophils Relative %: 70 %
Platelets: 283 10*3/uL (ref 150–400)
RBC: 4.3 MIL/uL (ref 4.22–5.81)
RDW: 14.9 % (ref 11.5–15.5)
WBC: 11.5 10*3/uL — ABNORMAL HIGH (ref 4.0–10.5)
nRBC: 0 % (ref 0.0–0.2)

## 2019-03-23 LAB — MAGNESIUM: Magnesium: 2.1 mg/dL (ref 1.7–2.4)

## 2019-03-23 LAB — IRON AND TIBC
Iron: 44 ug/dL — ABNORMAL LOW (ref 45–182)
Saturation Ratios: 11 % — ABNORMAL LOW (ref 17.9–39.5)
TIBC: 413 ug/dL (ref 250–450)
UIBC: 369 ug/dL

## 2019-03-23 LAB — RENAL FUNCTION PANEL
Albumin: 3.3 g/dL — ABNORMAL LOW (ref 3.5–5.0)
Anion gap: 10 (ref 5–15)
BUN: 15 mg/dL (ref 6–20)
CO2: 28 mmol/L (ref 22–32)
Calcium: 8.8 mg/dL — ABNORMAL LOW (ref 8.9–10.3)
Chloride: 103 mmol/L (ref 98–111)
Creatinine, Ser: 1.07 mg/dL (ref 0.61–1.24)
GFR calc Af Amer: >60 mL/min (ref >60)
GFR calc non Af Amer: >60 mL/min (ref >60)
Glucose, Bld: 109 mg/dL — ABNORMAL HIGH (ref 70–99)
Phosphorus: 3.5 mg/dL (ref 2.5–4.6)
Potassium: 3.7 mmol/L (ref 3.5–5.1)
Sodium: 141 mmol/L (ref 135–145)

## 2019-03-23 LAB — BASIC METABOLIC PANEL
Anion gap: 9 (ref 5–15)
BUN: 18 mg/dL (ref 6–20)
CO2: 31 mmol/L (ref 22–32)
Calcium: 9.4 mg/dL (ref 8.9–10.3)
Chloride: 102 mmol/L (ref 98–111)
Creatinine, Ser: 1.17 mg/dL (ref 0.61–1.24)
GFR calc Af Amer: >60 mL/min (ref >60)
GFR calc non Af Amer: >60 mL/min (ref >60)
Glucose, Bld: 115 mg/dL — ABNORMAL HIGH (ref 70–99)
Potassium: 3.5 mmol/L (ref 3.5–5.1)
Sodium: 142 mmol/L (ref 135–145)

## 2019-03-23 LAB — LIPID PANEL
Cholesterol: 159 mg/dL (ref 0–200)
HDL: 32 mg/dL — ABNORMAL LOW (ref >40)
LDL Cholesterol: 106 mg/dL — ABNORMAL HIGH (ref 0–99)
Total CHOL/HDL Ratio: 5 RATIO
Triglycerides: 105 mg/dL (ref <150)
VLDL: 21 mg/dL (ref 0–40)

## 2019-03-23 LAB — HEPARIN LEVEL (UNFRACTIONATED)
Heparin Unfractionated: 1.4 IU/mL — ABNORMAL HIGH (ref 0.30–0.70)
Heparin Unfractionated: 0.89 IU/mL — ABNORMAL HIGH (ref 0.30–0.70)
Heparin Unfractionated: 0.75 IU/mL — ABNORMAL HIGH (ref 0.30–0.70)

## 2019-03-23 LAB — APTT
aPTT: 123 seconds — ABNORMAL HIGH (ref 24–36)
aPTT: 82 seconds — ABNORMAL HIGH (ref 24–36)
aPTT: 70 seconds — ABNORMAL HIGH (ref 24–36)

## 2019-03-23 LAB — FERRITIN: Ferritin: 34 ng/mL (ref 24–336)

## 2019-03-23 MED ORDER — FUROSEMIDE 10 MG/ML IJ SOLN
120.0000 mg | Freq: Two times a day (BID) | INTRAVENOUS | Status: DC
  Administered 2019-03-23 – 2019-03-24 (×4): 120 mg via INTRAVENOUS
  Filled 2019-03-23 (×2): qty 12
  Filled 2019-03-23 (×2): qty 10
  Filled 2019-03-23 (×2): qty 12

## 2019-03-23 MED ORDER — DIGOXIN 0.25 MG/ML IJ SOLN
0.2500 mg | Freq: Once | INTRAMUSCULAR | Status: AC
  Administered 2019-03-23: 0.25 mg via INTRAVENOUS
  Filled 2019-03-23: qty 2

## 2019-03-23 MED ORDER — POTASSIUM CHLORIDE CRYS ER 20 MEQ PO TBCR
40.0000 meq | EXTENDED_RELEASE_TABLET | Freq: Two times a day (BID) | ORAL | Status: DC
  Administered 2019-03-23 – 2019-03-25 (×5): 40 meq via ORAL
  Filled 2019-03-23 (×5): qty 2

## 2019-03-23 MED ORDER — DIGOXIN 0.25 MG/ML IJ SOLN
0.5000 mg | Freq: Once | INTRAMUSCULAR | Status: AC
  Administered 2019-03-23: 0.5 mg via INTRAVENOUS
  Filled 2019-03-23: qty 2

## 2019-03-23 NOTE — Progress Notes (Signed)
PROGRESS NOTE    Parker Calderon  [id number] DOB: September 11, 1960 DOA: 03/22/2019 PCP: Idelia Salm, PA-C (Inactive)  Outpatient Specialists:   Brief Narrative: Patient is a 58 year old African-American male, morbidly obese with a BMI of 50.8 kg/m, with past medical history significant for hypertension, query congestive heart failure (type unknown), hyperlipidemia, arthritis, likely undiagnosed OSA/OHS and newly diagnosed atrial fibrillation.  Apparently, patient was seen by the PCP few days prior to presentation and was noted to be in atrial fibrillation.  PCP prescribed Cardizem and DOAC.  Patient took first dose of Cardizem the night prior to presentation.  Patient presented with CHF exacerbation and significant volume overload, with atrial fibrillation with rapid ventricular response.  Echocardiogram done yesterday revealed left ventricle ejection fraction of less than 20%, indeterminate diastolic parameters and severely decreased right ventricular function.  Patient was initially on Cardizem, but this has been changed to amiodarone.  Cardiology team is directing care.  03/23/2019: Patient seen.  Volume overload is improving.  Patient is on high-dose of diuretics.  Urine creatinine today is 1.07.  Patient remains in atrial fibrillation with heart rate in 120s.  As documented above, cardiology team is directing care.  No fever or chills, no chest pain.  No other constitutional symptoms reported.  Assessment & Plan:   Principal Problem:   Atrial fibrillation with rapid ventricular response (HCC) Active Problems:   Volume overload   Elevated blood pressure reading   HLD (hyperlipidemia)   Prolonged QT interval  Atrial fibrillation with rapid ventricular response: -IV Cardizem has been discontinued. -Patient is currently on IV amiodarone and heparin drip. -Heart rate is in the 120s per minute. -Echocardiogram finding is as documented above. -TSH is 1.22. -We defer further management to  the cardiology team.  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 -Patient is also on Aldactone 25 Mg p.o. once daily and BiDil 20/37.5 MG tabs 2 tablets 3 times daily. -Cardiology is directing care. -We will defer decision to proceed with cardiac catheterization to the cardiology team.  Hypokalemia: Continue to monitor and replete potassium. Potassium is 3.7 today (up from 3)  Hypertension: Blood pressure today is 110/71 mmHg (optimize)  Continue to monitor closely.   Patient is on Aldactone and diuretics.   History of hyperlipidemia: -Follow repeat lipid panel.    Mild leukocytosis: -Likely reactive. -No signs of infection. -WBC is 11.5 today (down from 12.2).  Likely undiagnosed OSA/OHS: -Sleep study on outpatient basis. -Patient will likely need CPAP.  Morbid obesity: -Further management on discharge. -Diet and exercise. -Will defer further management to the primary care provider.  Mild troponinemia: -Likely secondary to A. fib with rapid ventricular response and CHF.   -Cardiology is following patient.    QT prolongation on EKG: -EKG on presentation revealed QTc interval 527 ms.   -Repeat EKG today as hypokalemia has been corrected significantly.   -Magnesium today is 2.1.     HIV screening: -Nonreactive.  DVT prophylaxis: Heparin drip. Code Status: Full code Family Communication:  Disposition Plan: Likely discharge back home eventually.   Consultants:   Cardiology  Procedures:   Echocardiogram  Antimicrobials:   None   Subjective: Shortness of breath, orthopnea and dyspnea on exertion improving. No chest pain No fever or chills.  Objective: Vitals:   03/23/19 0500 03/23/19 0531 03/23/19 0700 03/23/19 0800  BP: 118/77  (!) 115/91 (!) 147/112  Pulse: (!) 115 (!) 105 (!) 126 96  Resp: (!) 29 (!)  23 (!) 24 (!) 32  Temp:    97.9 F (36.6 C)   TempSrc:    Oral  SpO2: 94%  98% 95%  Weight:      Height:        Intake/Output Summary (Last 24 hours) at 03/23/2019 0926 Last data filed at 03/23/2019 0800 Gross per 24 hour  Intake 1940.06 ml  Output 2675 ml  Net -734.94 ml   Filed Weights   03/22/19 0127 03/23/19 0456  Weight: (!) 166.9 kg (!) 165.3 kg    Examination:  General exam: Appears calm and comfortable. Respiratory system: Significantly decreased air entry globally.   Cardiovascular system: S1 & S2, irregularly irregular.   Gastrointestinal system: Abdomen is morbidly obese, soft and nontender.  Organs are difficult to assess.   Central nervous system: Awake and alert.  Patient moves all extremities.   Extremities: Bilateral lower extremity edema, improving.  Data Reviewed: I have personally reviewed following labs and imaging studies  CBC: Recent Labs  Lab 03/21/19 2205 03/22/19 0642 03/23/19 0032  WBC 12.1* 12.2* 11.5*  NEUTROABS  --   --  8.0*  HGB 14.6 14.6 13.5  HCT 46.1 46.9 43.0  MCV 100.2* 100.6* 100.0  PLT 277 285 [id number]   Basic Metabolic Panel: Recent Labs  Lab 03/21/19 2205 03/22/19 0642 03/22/19 1208 03/22/19 1837 03/23/19 0032  NA 141 143 141 143 141  K 3.3* 3.0* 3.1* 3.5 3.7  CL 106 104 103 105 103  CO2 26 28 29 28 28   GLUCOSE 112* 106* 117* 138* 109*  BUN 16 15 14 15 15   CREATININE 0.90 0.88 0.96 0.97 1.07  CALCIUM 8.6* 8.9 8.9 9.0 8.8*  MG  --  2.0  --   --  2.1  PHOS  --   --   --   --  3.5   GFR: Estimated Creatinine Clearance: 117 mL/min (by C-G formula based on SCr of 1.07 mg/dL). Liver Function Tests: Recent Labs  Lab 03/23/19 0032  ALBUMIN 3.3*   No results for input(s): LIPASE, AMYLASE in the last 168 hours. No results for input(s): AMMONIA in the last 168 hours. Coagulation Profile: Recent Labs  Lab 03/21/19 2205  INR 1.5*   Cardiac Enzymes: No results for input(s): CKTOTAL, CKMB, CKMBINDEX, TROPONINI in the last 168 hours. BNP (last 3 results) No results  for input(s): PROBNP in the last 8760 hours. HbA1C: Recent Labs    03/22/19 0642  HGBA1C 6.1*   CBG: No results for input(s): GLUCAP in the last 168 hours. Lipid Profile: Recent Labs    03/22/19 0642  CHOL 159  HDL 31*  LDLCALC 119*  TRIG 44  CHOLHDL 5.1   Thyroid Function Tests: Recent Labs    03/22/19 0642  TSH 1.220  FREET4 1.19*   Anemia Panel: No results for input(s): VITAMINB12, FOLATE, FERRITIN, TIBC, IRON, RETICCTPCT in the last 72 hours. Urine analysis:    Component Value Date/Time   COLORURINE STRAW (A) 03/22/2019 0617   APPEARANCEUR CLEAR 03/22/2019 0617   LABSPEC 1.005 03/22/2019 0617   PHURINE 5.0 03/22/2019 0617   GLUCOSEU NEGATIVE 03/22/2019 0617   HGBUR SMALL (A) 03/22/2019 0617   BILIRUBINUR NEGATIVE 03/22/2019 0617   KETONESUR NEGATIVE 03/22/2019 0617   PROTEINUR NEGATIVE 03/22/2019 0617   NITRITE NEGATIVE 03/22/2019 0617   LEUKOCYTESUR NEGATIVE 03/22/2019 0617   Sepsis Labs: @LABRCNTIP (procalcitonin:4,lacticidven:4)  ) Recent Results (from the past 240 hour(s))  SARS Coronavirus 2 by RT PCR (hospital order, performed in [place] hospital  lab) Nasopharyngeal Nasopharyngeal Swab     Status: None   Collection Time: 03/22/19 12:58 AM   Specimen: Nasopharyngeal Swab  Result Value Ref Range Status   SARS Coronavirus 2 NEGATIVE NEGATIVE Final    Comment: (NOTE) If result is NEGATIVE SARS-CoV-2 target nucleic acids are NOT DETECTED. The SARS-CoV-2 RNA is generally detectable in upper and lower  respiratory specimens during the acute phase of infection. The lowest  concentration of SARS-CoV-2 viral copies this assay can detect is 250  copies / mL. A negative result does not preclude SARS-CoV-2 infection  and should not be used as the sole basis for treatment or other  patient management decisions.  A negative result may occur with  improper specimen collection / handling, submission of specimen other  than nasopharyngeal swab, presence of  viral mutation(s) within the  areas targeted by this assay, and inadequate number of viral copies  (<250 copies / mL). A negative result must be combined with clinical  observations, patient history, and epidemiological information. If result is POSITIVE SARS-CoV-2 target nucleic acids are DETECTED. The SARS-CoV-2 RNA is generally detectable in upper and lower  respiratory specimens dur ing the acute phase of infection.  Positive  results are indicative of active infection with SARS-CoV-2.  Clinical  correlation with patient history and other diagnostic information is  necessary to determine patient infection status.  Positive results do  not rule out bacterial infection or co-infection with other viruses. If result is PRESUMPTIVE POSTIVE SARS-CoV-2 nucleic acids MAY BE PRESENT.   A presumptive positive result was obtained on the submitted specimen  and confirmed on repeat testing.  While 2019 novel coronavirus  (SARS-CoV-2) nucleic acids may be present in the submitted sample  additional confirmatory testing may be necessary for epidemiological  and / or clinical management purposes  to differentiate between  SARS-CoV-2 and other Sarbecovirus currently known to infect humans.  If clinically indicated additional testing with an alternate test  methodology [phone]) is advised. The SARS-CoV-2 RNA is generally  detectable in upper and lower respiratory sp ecimens during the acute  phase of infection. The expected result is Negative. Fact Sheet for Patients:  StrictlyIdeas.no Fact Sheet for Healthcare Providers: BankingDealers.co.za This test is not yet approved or cleared by the [place] FDA and has been authorized for detection and/or diagnosis of SARS-CoV-2 by FDA under an Emergency Use Authorization (EUA).  This EUA will remain in effect (meaning this test can be used) for the duration of the COVID-19 declaration under Section  564(b)(1) of the Act, 21 U.S.C. section 360bbb-3(b)(1), unless the authorization is terminated or revoked sooner. Performed at [facility], [place] [address], [place], [place] [address]   MRSA PCR Screening     Status: Abnormal   Collection Time: 03/22/19  8:36 AM   Specimen: Nasopharyngeal  Result Value Ref Range Status   MRSA by PCR POSITIVE (A) NEGATIVE Final    Comment:        The GeneXpert MRSA Assay (FDA approved for NASAL specimens only), is one component of a comprehensive MRSA colonization surveillance program. It is not intended to diagnose MRSA infection nor to guide or monitor treatment for MRSA infections. RESULT CALLED TO, READ BACK BY AND VERIFIED WITH: Sallyanne Havers [id number] @ [id number] [place] Performed at [facility] [address] [place], [place] [address]          Radiology Studies: Dg Chest 2 View  Result Date: 03/21/2019 CLINICAL DATA:  Feet  swelling EXAM: CHEST - 2 VIEW COMPARISON:  None. FINDINGS: Heart is borderline in size. Mild vascular congestion. No overt edema. No confluent opacities or effusions. No acute bony abnormality. IMPRESSION: Borderline heart size.  Mild vascular congestion. Electronically Signed   By: Rolm Baptise M.D.   On: 03/21/2019 21:56        Scheduled Meds: . atorvastatin  40 mg Oral q1800  . Chlorhexidine Gluconate Cloth  6 each Topical Daily  . isosorbide-hydrALAZINE  2 tablet Oral TID  . mupirocin ointment  1 application Nasal BID  . potassium chloride  40 mEq Oral Q12H  . spironolactone  25 mg Oral Daily   Continuous Infusions: . amiodarone 30 mg/hr (03/23/19 0859)  . furosemide 120 mg (03/23/19 0905)  . heparin 1,500 Units/hr (03/23/19 0456)     LOS: 1 day    Time spent: 35 minutes.    Dana Allan, MD  [facility] Pager #: [phone] 7PM-7AM contact night coverage as above

## 2019-03-23 NOTE — Progress Notes (Signed)
ANTICOAGULATION CONSULT NOTE - Follow Up Consult  Pharmacy Consult for Heparin Indication: atrial fibrillation  No Known Allergies  Patient Measurements: Height: 5\' 11"  (180.3 cm) Weight: (!) 368 lb (166.9 kg) IBW/kg (Calculated) : 75.3 Heparin Dosing Weight:   Vital Signs: Temp: 98 F (36.7 C) (11/03 0400) Temp Source: Oral (11/03 0400) BP: 112/74 (11/03 0100) Pulse Rate: 115 (11/03 0100)  Labs: Recent Labs    03/21/19 2205 03/22/19 0131 03/22/19 0334 03/22/19 0642 03/22/19 1208 03/22/19 1837 03/23/19 0032  HGB 14.6  --   --  14.6  --   --  13.5  HCT 46.1  --   --  46.9  --   --  43.0  PLT 277  --   --  285  --   --  283  APTT 35  --   --  37* 59* 90* 123*  LABPROT 18.2*  --   --   --   --   --   --   INR 1.5*  --   --   --   --   --   --   HEPARINUNFRC  --  >2.20*  --   --   --   --  1.40*  CREATININE 0.90  --   --  0.88 0.96 0.97 1.07  TROPONINIHS 28*  --  25*  --   --   --   --     Estimated Creatinine Clearance: 117.7 mL/min (by C-G formula based on SCr of 1.07 mg/dL).   Medications:  Infusions:  . amiodarone 30 mg/hr (03/23/19 0300)  . furosemide Stopped (03/22/19 1913)  . heparin 1,700 Units/hr (03/22/19 2101)    Assessment: Patient with high PTT and heparin level.  PTT ordered with Heparin level until both correlate due to possible drug-lab interaction between oral anticoagulant (rivaroxaban, edoxaban, or apixaban) and anti-Xa level (aka heparin level).  No heparin issues per RN.  Goal of Therapy:  Heparin level 0.3-0.7 units/ml aPTT 66-102 seconds Monitor platelets by anticoagulation protocol: Yes   Plan:  Decrease heparin to 1500 units/hr Recheck levels at [address], [place] 03/23/2019,4:46 AM

## 2019-03-23 NOTE — Progress Notes (Signed)
Cardiology Progress Note  Patient ID: Parker Calderon MRN: [id number] DOB: 08/16/1960 Date of Encounter: 03/23/2019  Primary Cardiologist: No primary care provider on file.  Subjective  Echocardiogram showed severely reduced ejection fraction.  Good urine output overnight.  Potassium much better.  Reports his breathing is improved.  ROS:  All other ROS reviewed and negative. Pertinent positives noted in the HPI.     Inpatient Medications  Scheduled Meds: . atorvastatin  40 mg Oral q1800  . Chlorhexidine Gluconate Cloth  6 each Topical Daily  . digoxin  0.5 mg Intravenous Once   Followed by  . digoxin  0.25 mg Intravenous Once  . isosorbide-hydrALAZINE  2 tablet Oral TID  . mupirocin ointment  1 application Nasal BID  . potassium chloride  40 mEq Oral Q12H  . spironolactone  25 mg Oral Daily   Continuous Infusions: . amiodarone 30 mg/hr (03/23/19 1100)  . furosemide Stopped (03/23/19 1005)  . heparin 1,500 Units/hr (03/23/19 0456)   PRN Meds: acetaminophen   Vital Signs   Vitals:   03/23/19 0800 03/23/19 0900 03/23/19 1000 03/23/19 1100  BP: (!) 147/112 133/88 110/71 118/62  Pulse: 96 76 (!) 126 (!) 138  Resp: (!) 32 (!) 29 (!) 27 19  Temp: 97.9 F (36.6 C)     TempSrc: Oral     SpO2: 95% 97% 97% 96%  Weight:      Height:        Intake/Output Summary (Last 24 hours) at 03/23/2019 1127 Last data filed at 03/23/2019 1100 Gross per 24 hour  Intake 2019.67 ml  Output 3475 ml  Net -1455.33 ml   Last 3 Weights 03/23/2019 03/22/2019 01/08/2013  Weight (lbs) 364 lb 6.7 oz 368 lb 294 lb  Weight (kg) 165.3 kg 166.924 kg 133.358 kg      Telemetry  Overnight telemetry shows atrial fibrillation with heart rate in the 120-140 range, which I personally reviewed.   ECG  The most recent ECG shows atrial fibrillation with RVR, which I personally reviewed.   Physical Exam   Vitals:   03/23/19 0800 03/23/19 0900 03/23/19 1000 03/23/19 1100  BP: (!) 147/112 133/88 110/71  118/62  Pulse: 96 76 (!) 126 (!) 138  Resp: (!) 32 (!) 29 (!) 27 19  Temp: 97.9 F (36.6 C)     TempSrc: Oral     SpO2: 95% 97% 97% 96%  Weight:      Height:         Intake/Output Summary (Last 24 hours) at 03/23/2019 1127 Last data filed at 03/23/2019 1100 Gross per 24 hour  Intake 2019.67 ml  Output 3475 ml  Net -1455.33 ml    Last 3 Weights 03/23/2019 03/22/2019 01/08/2013  Weight (lbs) 364 lb 6.7 oz 368 lb 294 lb  Weight (kg) 165.3 kg 166.924 kg 133.358 kg    Body mass index is 50.83 kg/m.  General: Morbidly obese gentleman Head: Atraumatic, normal size  Eyes: PEERLA, EOMI  Neck: Supple, JVD up to mid neck, 16 to 18 cm of water Endocrine: No thryomegaly Cardiac: Normal S1, S2; RRR; no murmurs, rubs, or gallops Lungs: Crackles at the lung bases bilaterally Abd: Soft, nontender, no hepatomegaly  Ext: 2+ pitting edema up to knees, improved since yesterday Musculoskeletal: No deformities, BUE and BLE strength normal and equal Skin: Warm and dry, no rashes   Neuro: Alert and oriented to person, place, time, and situation, CNII-XII grossly intact, no focal deficits  Psych: Normal mood and affect  Labs  High Sensitivity Troponin:   Recent Labs  Lab 03/21/19 2205 03/22/19 0334  TROPONINIHS 28* 25*     Cardiac EnzymesNo results for input(s): TROPONINI in the last 168 hours. No results for input(s): TROPIPOC in the last 168 hours.  Chemistry Recent Labs  Lab 03/22/19 1208 03/22/19 1837 03/23/19 0032  NA 141 143 141  K 3.1* 3.5 3.7  CL 103 105 103  CO2 29 28 28   GLUCOSE 117* 138* 109*  BUN 14 15 15   CREATININE 0.96 0.97 1.07  CALCIUM 8.9 9.0 8.8*  ALBUMIN  --   --  3.3*  GFRNONAA >60 >60 >60  GFRAA >60 >60 >60  ANIONGAP 9 10 10     Hematology Recent Labs  Lab 03/21/19 2205 03/22/19 0642 03/23/19 0032  WBC 12.1* 12.2* 11.5*  RBC 4.60 4.66 4.30  HGB 14.6 14.6 13.5  HCT 46.1 46.9 43.0  MCV 100.2* 100.6* 100.0  MCH 31.7 31.3 31.4  MCHC 31.7 31.1 31.4  RDW  14.3 14.6 14.9  PLT 277 285 283   BNP Recent Labs  Lab 03/21/19 2245  BNP 261.6*    DDimer No results for input(s): DDIMER in the last 168 hours.   Radiology  Dg Chest 2 View  Result Date: 03/21/2019 CLINICAL DATA:  Feet swelling EXAM: CHEST - 2 VIEW COMPARISON:  None. FINDINGS: Heart is borderline in size. Mild vascular congestion. No overt edema. No confluent opacities or effusions. No acute bony abnormality. IMPRESSION: Borderline heart size.  Mild vascular congestion. Electronically Signed   By: Rolm Baptise M.D.   On: 03/21/2019 21:56    Cardiac Studies  TTE 03/22/2019  1. Left ventricular ejection fraction, by visual estimation, is <20%. The left ventricle has severely decreased function. There is no left ventricular hypertrophy.  2. Definity contrast agent was given IV to delineate the left ventricular endocardial borders.  3. Moderately dilated left ventricular internal cavity size.  4. The left ventricle demonstrates global hypokinesis.  5. Left ventricular diastolic parameters are indeterminate.  6. LV thrombus excluded by echo contrast.  7. Global right ventricle has severely reduced systolic function.The right ventricular size is not well visualized. Right vetricular wall thickness was not assessed.  8. Left atrial size was normal.  9. Right atrial size was normal. 10. Mild to moderate aortic valve annular calcification. 11. The mitral valve is normal in structure. Mild to moderate mitral valve regurgitation. 12. The tricuspid valve is normal in structure. Tricuspid valve regurgitation mild-moderate. 13. The aortic valve is tricuspid. Aortic valve regurgitation is not visualized. No evidence of aortic valve sclerosis or stenosis. 14. The pulmonic valve was not well visualized. Pulmonic valve regurgitation is not visualized. 15. Mildly elevated pulmonary artery systolic pressure. 16. The inferior vena cava is dilated in size with <50% respiratory variability, suggesting  right atrial pressure of 15 mmHg.  Patient Profile  Parker Calderon is a 59 y.o. male with morbid obesity who was admitted on 11/2 for newly diagnosed atrial fibrillation with RVR, newly diagnosed systolic heart failure ejection fraction around 15%, volume overload.  Assessment & Plan  1.  Acute decompensated systolic heart failure, ejection fraction 15%, unclear etiology at this time -His echocardiogram yesterday was quite concerning with an ejection fraction around 15%.  We have transitioned him to amiodarone and he will continue heparin for his A. Fib. -I will load him with digoxin 0.5 mg x 1 and then 0.25 mg x 1 6 hours after the first dose.  We will check  a digoxin level in the morning and then resume maintenance dose. -We will hold any beta-blockers or calcium channel blockers given his decompensated systolic heart failure -I have added Aldactone and BiDil 2 tabs 3 times daily for afterload reduction, and pressures are much improved. -Overall, he is warm and wet on examination and does not appear to be in cardiogenic shock -We will continue 120 mg of IV Lasix twice daily with a goal of 3 to 5 L of urine output daily -We will also continue with aggressive potassium supplementation of 40 mg with each dose of Lasix -He will ultimately need a left heart and right heart catheterization, but we need to get him more optimized from a volume standpoint prior to this -He will also need a TEE and cardioversion to get him back in normal rhythm but we need him more optimized from a volume standpoint before we do this -Regarding etiology, thyroid studies are normal, A1c 6.1, no evidence of acute ischemic changes on his ECG.  I will check an iron profile today.  I do suspect his morbid obesity and sleep apnea have also contributed to this.  We will still need to proceed with a left and right heart cath as above at some point.  2.  New onset atrial fibrillation with rapid ventricular response -Unclear if this  actually caused his cardiomyopathy or if the cardiomyopathy led to his atrial fibrillation -We will continue amiodarone and I have added digoxin for better rate control -Please avoid beta-blockers and calcium channel blockers in this gentleman with severely decompensated systolic heart failure -We will continue the heparin drip for now -I do anticipate a TEE/cardioversion in the next 2 to 3 days.  We just need to get his volume status much better as we can tolerate the procedure.   CRITICAL CARE Performed by: Lake Bells T O'Neal  Total critical care time: 45 minutes. Critical care time was exclusive of separately billable procedures and treating other patients. Critical care was necessary to treat or prevent imminent or life-threatening deterioration. Critical care was time spent personally by me on the following activities: development of treatment plan with patient and/or surrogate as well as nursing, discussions with consultants, evaluation of patient's response to treatment, examination of patient, obtaining history from patient or surrogate, ordering and performing treatments and interventions, ordering and review of laboratory studies, ordering and review of radiographic studies, pulse oximetry and re-evaluation of patient's condition.  For questions or updates, please contact [place] Please consult [email or web address] for contact info under   Signed, Lake Bells T. Audie Box, [place]  03/23/2019 11:27 AM

## 2019-03-23 NOTE — Progress Notes (Signed)
ANTICOAGULATION CONSULT NOTE - Follow Up Consult  Pharmacy Consult for IV heparin Indication: new onset atrial fibrillation  No Known Allergies  Patient Measurements: Height: 5\' 11"  (180.3 cm) Weight: (!) 364 lb 6.7 oz (165.3 kg) IBW/kg (Calculated) : 75.3 Heparin Dosing Weight: 116 kg  Vital Signs: Temp: 97.6 F (36.4 C) (11/03 1918) Temp Source: Oral (11/03 1918) BP: 129/87 (11/03 1800) Pulse Rate: 130 (11/03 1800)  Labs: Recent Labs    03/21/19 2205  03/22/19 0334 03/22/19 0642  03/22/19 1837 03/23/19 0032 03/23/19 1253 03/23/19 1558 03/23/19 1901  HGB 14.6  --   --  14.6  --   --  13.5  --   --   --   HCT 46.1  --   --  46.9  --   --  43.0  --   --   --   PLT 277  --   --  285  --   --  283  --   --   --   APTT 35  --   --  37*   < > 90* 123* 82*  --  70*  LABPROT 18.2*  --   --   --   --   --   --   --   --   --   INR 1.5*  --   --   --   --   --   --   --   --   --   HEPARINUNFRC  --    < >  --   --   --   --  1.40* 0.89*  --  0.75*  CREATININE 0.90  --   --  0.88   < > 0.97 1.07  --  1.17  --   TROPONINIHS 28*  --  25*  --   --   --   --   --   --   --    < > = values in this interval not displayed.    Estimated Creatinine Clearance: 107 mL/min (by C-G formula based on SCr of 1.17 mg/dL).   Medications:  - Recently prescribed xarelto 20 mg daily by PCP for a-fib (took first dose on 11/1 at 0730 PTA)  Assessment: Patient is a 58 y.o M recently diagnosed with a-fib by his PCP and was placed on xarelto and diltiazem. He presented to the ED on 11/1 with c/o SOB and peripheral edema and was found to be in a-fib with RVR. He was transitioned to IV heparin on admission.  Cardiology team recommends TEE/cardioversion and heart cath when patient is more stable.  Today, 03/23/2019: - Evening aPTT remains therapeutic at 70 seconds - Heparin level elevated at 0.75 units/mL, but trending down  - Note that recent Xarelto can falsely elevate heparin levels. Therefore,  will titrate/adjust heparin infusion based on aPTT for now until heparin level correlates with aPTT.  - Hgb, Pltc WNL - No bleeding or infusion issues noted per nursing   Goal of Therapy:  Heparin level 0.3-0.7 units/ml  aPTT 66-102 seconds Monitor platelets by anticoagulation protocol: Yes   Plan:  - Continue heparin infusion at 1500 units/hr - Daily CBC, heparin level, aPTT - Monitor closely for s/sx of bleeding    Lindell Spar, PharmD, BCPS Clinical Pharmacist  03/23/2019 8:34 PM

## 2019-03-23 NOTE — Progress Notes (Signed)
ANTICOAGULATION CONSULT NOTE - Follow Up Consult  Pharmacy Consult for IV heparin Indication: new onset atrial fibrillation  No Known Allergies  Patient Measurements: Height: 5\' 11"  (180.3 cm) Weight: (!) 364 lb 6.7 oz (165.3 kg) IBW/kg (Calculated) : 75.3 Heparin Dosing Weight: 116 kg  Vital Signs: Temp: 97.9 F (36.6 C) (11/03 0800) Temp Source: Oral (11/03 0800) BP: 147/112 (11/03 0800) Pulse Rate: 96 (11/03 0800)  Labs: Recent Labs    03/21/19 2205 03/22/19 0131 03/22/19 0334 03/22/19 0642 03/22/19 1208 03/22/19 1837 03/23/19 0032  HGB 14.6  --   --  14.6  --   --  13.5  HCT 46.1  --   --  46.9  --   --  43.0  PLT 277  --   --  285  --   --  283  APTT 35  --   --  37* 59* 90* 123*  LABPROT 18.2*  --   --   --   --   --   --   INR 1.5*  --   --   --   --   --   --   HEPARINUNFRC  --  >2.20*  --   --   --   --  1.40*  CREATININE 0.90  --   --  0.88 0.96 0.97 1.07  TROPONINIHS 28*  --  25*  --   --   --   --     Estimated Creatinine Clearance: 117 mL/min (by C-G formula based on SCr of 1.07 mg/dL).   Medications:  - Recently prescribed xarelto 20 mg daily by PCP for a-fib (took first dose on 11/1 at 0730 PTA)  Assessment: Patient is a 58 y.o M recently diagnosed with a-fib by his PCP and was placed on xarelto and diltiazem. He presented to the ED on 11/1 with c/o SOB and peripheral edema and was found to be in a-fib with RVR. He was transitioned to IV heparin on admission.  Cardiology team recommends DCCV and heart cath to evaluate HF when patient is more stable.  Today, 03/23/2019: - aPTT is therapeutic at 82; Heparin level is elevated at 0.89 - Note that recent xarelto can falsely elevate heparin levels. Therefore, will titrate/adjust heparin infusion based on aPTT for now until heparin level correlates with aPTT.  - CBC somewhat stable - No bleeding documented  Goal of Therapy:  Heparin level 0.3-0.7 units/ml  aPTT 66-102 seconds Monitor platelets by  anticoagulation protocol: Yes   Plan:  - continue heparin infusion at 1500 units/hr - re-check another 6 hr aPTT and heparin level at 7PM to ensure they are still at goal before changing to daily monitoring - Monitor closely for s/sx of bleeding   Dia Sitter, PharmD, BCPS 03/23/2019 8:37 AM

## 2019-03-24 DIAGNOSIS — R030 Elevated blood-pressure reading, without diagnosis of hypertension: Secondary | ICD-10-CM

## 2019-03-24 DIAGNOSIS — R601 Generalized edema: Secondary | ICD-10-CM

## 2019-03-24 LAB — COMPREHENSIVE METABOLIC PANEL
ALT: 15 U/L (ref 0–44)
AST: 21 U/L (ref 15–41)
Albumin: 3.3 g/dL — ABNORMAL LOW (ref 3.5–5.0)
Alkaline Phosphatase: 49 U/L (ref 38–126)
Anion gap: 9 (ref 5–15)
BUN: 14 mg/dL (ref 6–20)
CO2: 29 mmol/L (ref 22–32)
Calcium: 8.7 mg/dL — ABNORMAL LOW (ref 8.9–10.3)
Chloride: 102 mmol/L (ref 98–111)
Creatinine, Ser: 0.99 mg/dL (ref 0.61–1.24)
GFR calc Af Amer: >60 mL/min (ref >60)
GFR calc non Af Amer: >60 mL/min (ref >60)
Glucose, Bld: 108 mg/dL — ABNORMAL HIGH (ref 70–99)
Potassium: 3.5 mmol/L (ref 3.5–5.1)
Sodium: 140 mmol/L (ref 135–145)
Total Bilirubin: 1.2 mg/dL (ref 0.3–1.2)
Total Protein: 6.1 g/dL — ABNORMAL LOW (ref 6.5–8.1)

## 2019-03-24 LAB — CBC WITH DIFFERENTIAL/PLATELET
Abs Immature Granulocytes: 0.05 10*3/uL (ref 0.00–0.07)
Basophils Absolute: 0 10*3/uL (ref 0.0–0.1)
Basophils Relative: 0 %
Eosinophils Absolute: 0.1 10*3/uL (ref 0.0–0.5)
Eosinophils Relative: 1 %
HCT: 43.2 % (ref 39.0–52.0)
Hemoglobin: 13.7 g/dL (ref 13.0–17.0)
Immature Granulocytes: 0 %
Lymphocytes Relative: 13 %
Lymphs Abs: 1.5 10*3/uL (ref 0.7–4.0)
MCH: 31.6 pg (ref 26.0–34.0)
MCHC: 31.7 g/dL (ref 30.0–36.0)
MCV: 99.5 fL (ref 80.0–100.0)
Monocytes Absolute: 1.3 10*3/uL — ABNORMAL HIGH (ref 0.1–1.0)
Monocytes Relative: 11 %
Neutro Abs: 9 10*3/uL — ABNORMAL HIGH (ref 1.7–7.7)
Neutrophils Relative %: 75 %
Platelets: 265 10*3/uL (ref 150–400)
RBC: 4.34 MIL/uL (ref 4.22–5.81)
RDW: 14.7 % (ref 11.5–15.5)
WBC: 11.9 10*3/uL — ABNORMAL HIGH (ref 4.0–10.5)
nRBC: 0 % (ref 0.0–0.2)

## 2019-03-24 LAB — BASIC METABOLIC PANEL
Anion gap: 10 (ref 5–15)
BUN: 18 mg/dL (ref 6–20)
CO2: 30 mmol/L (ref 22–32)
Calcium: 9.1 mg/dL (ref 8.9–10.3)
Chloride: 99 mmol/L (ref 98–111)
Creatinine, Ser: 1.21 mg/dL (ref 0.61–1.24)
GFR calc Af Amer: >60 mL/min (ref >60)
GFR calc non Af Amer: >60 mL/min (ref >60)
Glucose, Bld: 110 mg/dL — ABNORMAL HIGH (ref 70–99)
Potassium: 3.3 mmol/L — ABNORMAL LOW (ref 3.5–5.1)
Sodium: 139 mmol/L (ref 135–145)

## 2019-03-24 LAB — APTT: aPTT: 83 seconds — ABNORMAL HIGH (ref 24–36)

## 2019-03-24 LAB — DIGOXIN LEVEL: Digoxin Level: 0.7 ng/mL — ABNORMAL LOW (ref 0.8–2.0)

## 2019-03-24 LAB — MAGNESIUM
Magnesium: 1.9 mg/dL (ref 1.7–2.4)
Magnesium: 2 mg/dL (ref 1.7–2.4)

## 2019-03-24 LAB — HEPARIN LEVEL (UNFRACTIONATED): Heparin Unfractionated: 0.58 IU/mL (ref 0.30–0.70)

## 2019-03-24 MED ORDER — DIGOXIN 125 MCG PO TABS
0.1250 mg | ORAL_TABLET | Freq: Every day | ORAL | Status: AC
  Administered 2019-03-24 – 2019-03-26 (×3): 0.125 mg via ORAL
  Filled 2019-03-24 (×3): qty 1

## 2019-03-24 MED ORDER — ASPIRIN 81 MG PO CHEW
81.0000 mg | CHEWABLE_TABLET | ORAL | Status: AC
  Administered 2019-03-25: 81 mg via ORAL
  Filled 2019-03-24: qty 1

## 2019-03-24 MED ORDER — SODIUM CHLORIDE 0.9% FLUSH
3.0000 mL | Freq: Two times a day (BID) | INTRAVENOUS | Status: DC
  Administered 2019-03-24 – 2019-03-26 (×3): 3 mL via INTRAVENOUS

## 2019-03-24 NOTE — Progress Notes (Signed)
ANTICOAGULATION CONSULT NOTE - Follow Up Consult  Pharmacy Consult for IV heparin Indication: new onset atrial fibrillation  No Known Allergies  Patient Measurements: Height: 5\' 11"  (180.3 cm) Weight: (!) 364 lb 6.7 oz (165.3 kg) IBW/kg (Calculated) : 75.3 Heparin Dosing Weight: 116 kg  Vital Signs: Temp: 97.7 F (36.5 C) (11/04 0329) Temp Source: Oral (11/04 0329) BP: 142/83 (11/04 0400) Pulse Rate: 95 (11/04 0400)  Labs: Recent Labs    03/21/19 2205  03/22/19 0334 03/22/19 0642  03/23/19 0032 03/23/19 1253 03/23/19 1558 03/23/19 1901 03/24/19 0529  HGB 14.6  --   --  14.6  --  13.5  --   --   --  13.7  HCT 46.1  --   --  46.9  --  43.0  --   --   --  43.2  PLT 277  --   --  285  --  283  --   --   --  265  APTT 35  --   --  37*   < > 123* 82*  --  70* 83*  LABPROT 18.2*  --   --   --   --   --   --   --   --   --   INR 1.5*  --   --   --   --   --   --   --   --   --   HEPARINUNFRC  --    < >  --   --   --  1.40* 0.89*  --  0.75* 0.58  CREATININE 0.90  --   --  0.88   < > 1.07  --  1.17  --  0.99  TROPONINIHS 28*  --  25*  --   --   --   --   --   --   --    < > = values in this interval not displayed.    Estimated Creatinine Clearance: 126.5 mL/min (by C-G formula based on SCr of 0.99 mg/dL).   Medications:  - Recently prescribed xarelto 20 mg daily by PCP for a-fib (took first dose on 11/1 at 0730 PTA)  Assessment: Patient is a 58 y.o M recently diagnosed with a-fib by his PCP and was placed on xarelto and diltiazem. He presented to the ED on 11/1 with c/o SOB and peripheral edema and was found to be in a-fib with RVR. He was transitioned to IV heparin on admission.  Cardiology team recommends DCCV and heart cath to evaluate HF when patient is more stable.  Today, 03/24/2019: - aPTT is therapeutic at 83; Heparin level is therapeutic at 0.58 - Note that recent xarelto can falsely elevate heparin levels. Therefore, will titrate/adjust heparin infusion based on  aPTT for now until heparin level correlates with aPTT.  - CBC stable - No bleeding or infusion related issues reported   Goal of Therapy:  Heparin level 0.3-0.7 units/ml  aPTT 66-102 seconds Monitor platelets by anticoagulation protocol: Yes   Plan:  - Continue heparin infusion at 1500 units/hr - D/C aPTT since heparin level now correlating (Xarelto cleared) - Daily heparin level & CBC while on heparin - Monitor closely for s/sx of bleeding  Netta Cedars, PharmD, BCPS 03/24/2019 7:49 AM

## 2019-03-24 NOTE — Progress Notes (Signed)
PROGRESS NOTE  Parker Calderon [id number] DOB: 05-Feb-1961 DOA: 03/22/2019 PCP: Shirline Frees, MD   LOS: 2 days   Brief Narrative / Interim history: 58 year old male with obesity, hypertension, hyperlipidemia, came into the hospital and was admitted with shortness of breath and significant fluid overload, he was also found to be in A. fib with RVR.  He underwent a 2D echo which showed an EF of 20%.  Cardiology has been consulted and currently is getting diuresed.  Subjective / 24h Interval events: He is feeling a lot better this morning, appreciates that his abdominal swelling has gone down along with his ankle swelling.  He is having more energy.  He denies any chest pain, denies any abdominal pain, no nausea or vomiting.  Assessment & Plan: Principal Problem:   Atrial fibrillation with rapid ventricular response (HCC) Active Problems:   Volume overload   Elevated blood pressure reading   HLD (hyperlipidemia)   Prolonged QT interval  Principal Problem A. fib with RVR -Cardiology consulted, appreciate input.  Continue amiodarone infusion.  Heart rate on the monitor in the 1 teens, patient is comfortable -Continue heparin infusion for anticoagulation -Per cardiology he will need a TEE/cardioversion once optimized  Active Problems Acute on chronic systolic CHF -Patient getting diuresed with Lasix, monitor strict I's and O's, clinically volume status improved significantly -Renal function is stable -Unclear etiology for his CHF, will need left and right heart cath per cardiology once fluid optimized  Hypokalemia -Monitor and replete while getting diuretics  Hypertension -Currently getting diuresed  Hyperlipidemia -Continue statin  Possibly undiagnosed OSA/OSH -Sleep study as an outpatient  Morbid obesity -Patient will benefit from weight loss  Scheduled Meds: . atorvastatin  40 mg Oral q1800  . Chlorhexidine Gluconate Cloth  6 each Topical Daily  .  isosorbide-hydrALAZINE  2 tablet Oral TID  . mupirocin ointment  1 application Nasal BID  . potassium chloride  40 mEq Oral Q12H  . spironolactone  25 mg Oral Daily   Continuous Infusions: . amiodarone 30 mg/hr (03/23/19 2111)  . furosemide Stopped (03/23/19 1810)  . heparin 1,500 Units/hr (03/23/19 1412)   PRN Meds:.acetaminophen  DVT prophylaxis: Heparin infusion Code Status: Full code Family Communication: d/w patient  Disposition Plan: home when ready   Consultants:  Cardiology  Procedures:  2D echo:  IMPRESSIONS    1. Left ventricular ejection fraction, by visual estimation, is <20%. The left ventricle has severely decreased function. There is no left ventricular hypertrophy.  2. Definity contrast agent was given IV to delineate the left ventricular endocardial borders.  3. Moderately dilated left ventricular internal cavity size.  4. The left ventricle demonstrates global hypokinesis.  5. Left ventricular diastolic parameters are indeterminate.  6. LV thrombus excluded by echo contrast.  7. Global right ventricle has severely reduced systolic function.The right ventricular size is not well visualized. Right vetricular wall thickness was not assessed.  8. Left atrial size was normal.  9. Right atrial size was normal. 10. Mild to moderate aortic valve annular calcification. 11. The mitral valve is normal in structure. Mild to moderate mitral valve regurgitation. 12. The tricuspid valve is normal in structure. Tricuspid valve regurgitation mild-moderate. 13. The aortic valve is tricuspid. Aortic valve regurgitation is not visualized. No evidence of aortic valve sclerosis or stenosis. 14. The pulmonic valve was not well visualized. Pulmonic valve regurgitation is not visualized. 15. Mildly elevated pulmonary artery systolic pressure. 16. The inferior vena cava is dilated in size with <50% respiratory variability, suggesting right  atrial pressure of 15 mmHg.    Microbiology  none  Antimicrobials: none    Objective: Vitals:   03/23/19 2200 03/23/19 2303 03/24/19 0329 03/24/19 0400  BP: (!) 118/54   (!) 142/83  Pulse: (!) 112   95  Resp: (!) 26   (!) 24  Temp:  97.9 F (36.6 C) 97.7 F (36.5 C)   TempSrc:  Oral Oral   SpO2: 90%   97%  Weight:      Height:        Intake/Output Summary (Last 24 hours) at 03/24/2019 [id number] Last data filed at 03/24/2019 0600 Gross per 24 hour  Intake 1437.83 ml  Output 6500 ml  Net -5062.17 ml   Filed Weights   03/22/19 0127 03/23/19 0456  Weight: (!) 166.9 kg (!) 165.3 kg    Examination:  Constitutional: NAD Eyes: PERRL, lids and conjunctivae normal ENMT: Mucous membranes are moist. Respiratory: clear to auscultation bilaterally, no wheezing, no crackles. Normal respiratory effort. No accessory muscle use.  Cardiovascular: irregularly irregular, no murmurs heard. 1+ LE edema / anasarca. Good peripheral pulses Abdomen: no tenderness. Bowel sounds positive.  Musculoskeletal: no clubbing / cyanosis.  Skin: no rashes Neurologic: non focal, equal strength Psychiatric: Normal judgment and insight. Alert and oriented x 3. Normal mood.    Data Reviewed: I have independently reviewed following labs and imaging studies   CBC: Recent Labs  Lab 03/21/19 2205 03/22/19 0642 03/23/19 0032 03/24/19 0529  WBC 12.1* 12.2* 11.5* 11.9*  NEUTROABS  --   --  8.0* 9.0*  HGB 14.6 14.6 13.5 13.7  HCT 46.1 46.9 43.0 43.2  MCV 100.2* 100.6* 100.0 99.5  PLT 277 285 283 [id number]   Basic Metabolic Panel: Recent Labs  Lab 03/22/19 0642 03/22/19 1208 03/22/19 1837 03/23/19 0032 03/23/19 1558 03/24/19 0529  NA 143 141 143 141 142 140  K 3.0* 3.1* 3.5 3.7 3.5 3.5  CL 104 103 105 103 102 102  CO2 28 29 28 28 31 29   GLUCOSE 106* 117* 138* 109* 115* 108*  BUN 15 14 15 15 18 14   CREATININE 0.88 0.96 0.97 1.07 1.17 0.99  CALCIUM 8.9 8.9 9.0 8.8* 9.4 8.7*  MG 2.0  --   --  2.1  --  1.9  PHOS  --   --   --  3.5   --   --    GFR: Estimated Creatinine Clearance: 126.5 mL/min (by C-G formula based on SCr of 0.99 mg/dL). Liver Function Tests: Recent Labs  Lab 03/23/19 0032 03/24/19 0529  AST  --  21  ALT  --  15  ALKPHOS  --  49  BILITOT  --  1.2  PROT  --  6.1*  ALBUMIN 3.3* 3.3*   No results for input(s): LIPASE, AMYLASE in the last 168 hours. No results for input(s): AMMONIA in the last 168 hours. Coagulation Profile: Recent Labs  Lab 03/21/19 2205  INR 1.5*   Cardiac Enzymes: No results for input(s): CKTOTAL, CKMB, CKMBINDEX, TROPONINI in the last 168 hours. BNP (last 3 results) No results for input(s): PROBNP in the last 8760 hours. HbA1C: Recent Labs    03/22/19 0642  HGBA1C 6.1*   CBG: No results for input(s): GLUCAP in the last 168 hours. Lipid Profile: Recent Labs    03/22/19 0642 03/23/19 1253  CHOL 159 159  HDL 31* 32*  LDLCALC 119* 106*  TRIG 44 105  CHOLHDL 5.1 5.0   Thyroid Function Tests: Recent Labs  03/22/19 0642  TSH 1.220  FREET4 1.19*   Anemia Panel: Recent Labs    03/23/19 1253  FERRITIN 34  TIBC 413  IRON 44*   Urine analysis:    Component Value Date/Time   COLORURINE STRAW (A) 03/22/2019 0617   APPEARANCEUR CLEAR 03/22/2019 0617   LABSPEC 1.005 03/22/2019 0617   PHURINE 5.0 03/22/2019 0617   GLUCOSEU NEGATIVE 03/22/2019 0617   HGBUR SMALL (A) 03/22/2019 0617   BILIRUBINUR NEGATIVE 03/22/2019 0617   KETONESUR NEGATIVE 03/22/2019 0617   PROTEINUR NEGATIVE 03/22/2019 0617   NITRITE NEGATIVE 03/22/2019 0617   LEUKOCYTESUR NEGATIVE 03/22/2019 0617   Sepsis Labs: Invalid input(s): PROCALCITONIN, LACTICIDVEN  Recent Results (from the past 240 hour(s))  SARS Coronavirus 2 by RT PCR (hospital order, performed in [facility] hospital lab) Nasopharyngeal Nasopharyngeal Swab     Status: None   Collection Time: 03/22/19 12:58 AM   Specimen: Nasopharyngeal Swab  Result Value Ref Range Status   SARS Coronavirus 2 NEGATIVE NEGATIVE  Final    Comment: (NOTE) If result is NEGATIVE SARS-CoV-2 target nucleic acids are NOT DETECTED. The SARS-CoV-2 RNA is generally detectable in upper and lower  respiratory specimens during the acute phase of infection. The lowest  concentration of SARS-CoV-2 viral copies this assay can detect is 250  copies / mL. A negative result does not preclude SARS-CoV-2 infection  and should not be used as the sole basis for treatment or other  patient management decisions.  A negative result may occur with  improper specimen collection / handling, submission of specimen other  than nasopharyngeal swab, presence of viral mutation(s) within the  areas targeted by this assay, and inadequate number of viral copies  (<250 copies / mL). A negative result must be combined with clinical  observations, patient history, and epidemiological information. If result is POSITIVE SARS-CoV-2 target nucleic acids are DETECTED. The SARS-CoV-2 RNA is generally detectable in upper and lower  respiratory specimens dur ing the acute phase of infection.  Positive  results are indicative of active infection with SARS-CoV-2.  Clinical  correlation with patient history and other diagnostic information is  necessary to determine patient infection status.  Positive results do  not rule out bacterial infection or co-infection with other viruses. If result is PRESUMPTIVE POSTIVE SARS-CoV-2 nucleic acids MAY BE PRESENT.   A presumptive positive result was obtained on the submitted specimen  and confirmed on repeat testing.  While 2019 novel coronavirus  (SARS-CoV-2) nucleic acids may be present in the submitted sample  additional confirmatory testing may be necessary for epidemiological  and / or clinical management purposes  to differentiate between  SARS-CoV-2 and other Sarbecovirus currently known to infect humans.  If clinically indicated additional testing with an alternate test  methodology [phone]) is advised. The  SARS-CoV-2 RNA is generally  detectable in upper and lower respiratory sp ecimens during the acute  phase of infection. The expected result is Negative. Fact Sheet for Patients:  StrictlyIdeas.no Fact Sheet for Healthcare Providers: BankingDealers.co.za This test is not yet approved or cleared by the [place] FDA and has been authorized for detection and/or diagnosis of SARS-CoV-2 by FDA under an Emergency Use Authorization (EUA).  This EUA will remain in effect (meaning this test can be used) for the duration of the COVID-19 declaration under Section 564(b)(1) of the Act, 21 U.S.C. section 360bbb-3(b)(1), unless the authorization is terminated or revoked sooner. Performed at [facility], [place] [address] [place], [place] [address]   MRSA  PCR Screening     Status: Abnormal   Collection Time: 03/22/19  8:36 AM   Specimen: Nasopharyngeal  Result Value Ref Range Status   MRSA by PCR POSITIVE (A) NEGATIVE Final    Comment:        The GeneXpert MRSA Assay (FDA approved for NASAL specimens only), is one component of a comprehensive MRSA colonization surveillance program. It is not intended to diagnose MRSA infection nor to guide or monitor treatment for MRSA infections. RESULT CALLED TO, READ BACK BY AND VERIFIED WITH: Sallyanne Havers [id number] @ [id number] [place] Performed at [facility] [address], [place], [place] [address]       Radiology Studies: No results found.  Marzetta Board, MD, PhD [facility]  Contact via  [email or web address]  [place] P: [phone] F: [phone]

## 2019-03-24 NOTE — Progress Notes (Signed)
Cardiology Progress Note  Patient ID: Parker Calderon MRN: [id number] DOB: 05-24-1960 Date of Encounter: 03/24/2019  Primary Cardiologist: No primary care provider on file.  Subjective  Net -5 L.  Continues to urinate well.  Creatinine continues to improve.  Rates better controlled on digoxin.  ROS:  All other ROS reviewed and negative. Pertinent positives noted in the HPI.     Inpatient Medications  Scheduled Meds: . atorvastatin  40 mg Oral q1800  . Chlorhexidine Gluconate Cloth  6 each Topical Daily  . digoxin  0.125 mg Oral Daily  . isosorbide-hydrALAZINE  2 tablet Oral TID  . mupirocin ointment  1 application Nasal BID  . potassium chloride  40 mEq Oral Q12H  . sodium chloride flush  3 mL Intravenous Q12H  . spironolactone  25 mg Oral Daily   Continuous Infusions: . amiodarone 30 mg/hr (03/24/19 0753)  . furosemide 120 mg (03/24/19 0756)  . heparin 1,500 Units/hr (03/24/19 0804)   PRN Meds: acetaminophen   Vital Signs   Vitals:   03/24/19 0800 03/24/19 0900 03/24/19 0953 03/24/19 1000  BP: 132/83 118/83  (!) 145/85  Pulse: (!) 113 (!) 144 (!) 108 (!) 116  Resp: (!) 29 (!) 24  19  Temp:      TempSrc:      SpO2: 94% 96%  96%  Weight:      Height:        Intake/Output Summary (Last 24 hours) at 03/24/2019 1058 Last data filed at 03/24/2019 0732 Gross per 24 hour  Intake 794.91 ml  Output 5575 ml  Net -4780.09 ml   Last 3 Weights 03/23/2019 03/22/2019 01/08/2013  Weight (lbs) 364 lb 6.7 oz 368 lb 294 lb  Weight (kg) 165.3 kg 166.924 kg 133.358 kg      Telemetry  Overnight telemetry shows atrial fibrillation with rate in the 100-110 range, which I personally reviewed.   ECG  The most recent ECG shows atrial fibrillation with RVR, which I personally reviewed.   Physical Exam   Vitals:   03/24/19 0800 03/24/19 0900 03/24/19 0953 03/24/19 1000  BP: 132/83 118/83  (!) 145/85  Pulse: (!) 113 (!) 144 (!) 108 (!) 116  Resp: (!) 29 (!) 24  19  Temp:       TempSrc:      SpO2: 94% 96%  96%  Weight:      Height:         Intake/Output Summary (Last 24 hours) at 03/24/2019 1058 Last data filed at 03/24/2019 0732 Gross per 24 hour  Intake 794.91 ml  Output 5575 ml  Net -4780.09 ml    Last 3 Weights 03/23/2019 03/22/2019 01/08/2013  Weight (lbs) 364 lb 6.7 oz 368 lb 294 lb  Weight (kg) 165.3 kg 166.924 kg 133.358 kg    Body mass index is 50.83 kg/m.  General: Obese male, no acute distress Head: Atraumatic, normal size  Eyes: PEERLA, EOMI  Neck: JVD difficult to assess due to neck adiposity Endocrine: No thryomegaly Cardiac: Normal S1, S2; RRR; no murmurs, rubs, or gallops Lungs: Crackles noted at lung bases Abd: Soft, nontender, no hepatomegaly  Ext: 1+ pitting edema up to knees Musculoskeletal: No deformities, BUE and BLE strength normal and equal Skin: Warm and dry, no rashes   Neuro: Alert and oriented to person, place, time, and situation, CNII-XII grossly intact, no focal deficits  Psych: Normal mood and affect   Labs  High Sensitivity Troponin:   Recent Labs  Lab 03/21/19 2205 03/22/19  0334  TROPONINIHS 28* 25*     Cardiac EnzymesNo results for input(s): TROPONINI in the last 168 hours. No results for input(s): TROPIPOC in the last 168 hours.  Chemistry Recent Labs  Lab 03/23/19 0032 03/23/19 1558 03/24/19 0529  NA 141 142 140  K 3.7 3.5 3.5  CL 103 102 102  CO2 28 31 29   GLUCOSE 109* 115* 108*  BUN 15 18 14   CREATININE 1.07 1.17 0.99  CALCIUM 8.8* 9.4 8.7*  PROT  --   --  6.1*  ALBUMIN 3.3*  --  3.3*  AST  --   --  21  ALT  --   --  15  ALKPHOS  --   --  49  BILITOT  --   --  1.2  GFRNONAA >60 >60 >60  GFRAA >60 >60 >60  ANIONGAP 10 9 9     Hematology Recent Labs  Lab 03/22/19 0642 03/23/19 0032 03/24/19 0529  WBC 12.2* 11.5* 11.9*  RBC 4.66 4.30 4.34  HGB 14.6 13.5 13.7  HCT 46.9 43.0 43.2  MCV 100.6* 100.0 99.5  MCH 31.3 31.4 31.6  MCHC 31.1 31.4 31.7  RDW 14.6 14.9 14.7  PLT 285 283 265    BNP Recent Labs  Lab 03/21/19 2245  BNP 261.6*    DDimer No results for input(s): DDIMER in the last 168 hours.   Radiology  No results found.  Cardiac Studies  TTE 03/22/2019  1. Left ventricular ejection fraction, by visual estimation, is <20%. The left ventricle has severely decreased function. There is no left ventricular hypertrophy.  2. Definity contrast agent was given IV to delineate the left ventricular endocardial borders.  3. Moderately dilated left ventricular internal cavity size.  4. The left ventricle demonstrates global hypokinesis.  5. Left ventricular diastolic parameters are indeterminate.  6. LV thrombus excluded by echo contrast.  7. Global right ventricle has severely reduced systolic function.The right ventricular size is not well visualized. Right vetricular wall thickness was not assessed.  8. Left atrial size was normal.  9. Right atrial size was normal. 10. Mild to moderate aortic valve annular calcification. 11. The mitral valve is normal in structure. Mild to moderate mitral valve regurgitation. 12. The tricuspid valve is normal in structure. Tricuspid valve regurgitation mild-moderate. 13. The aortic valve is tricuspid. Aortic valve regurgitation is not visualized. No evidence of aortic valve sclerosis or stenosis. 14. The pulmonic valve was not well visualized. Pulmonic valve regurgitation is not visualized. 15. Mildly elevated pulmonary artery systolic pressure. 16. The inferior vena cava is dilated in size with <50% respiratory variability, suggesting right atrial pressure of 15 mmHg.  Patient Profile  Parker Calderon is a 58 y.o. male with morbid obesity who was admitted on 11/2 for newly diagnosed atrial fibrillation with RVR, newly diagnosed systolic heart failure ejection fraction around 15%, volume overload.  Assessment & Plan  1.  Acute decompensated systolic heart failure, ejection fraction 15%, unclear etiology at this time -Ejection fraction  around 15%, with unclear etiology.  TSH was normal, no significant alcohol or drug abuse.  Iron studies normal.  Likely he has morbid obesity and untreated sleep apnea that led to his systolic heart failure.  Atrial fibrillation could be at play here to. -We will proceed with left and right heart cath tomorrow.  I suspect he is getting close to euvolemic but given his weight a right heart cath will help [place] get an idea of his volume status.  I also think  while were there we should just go ahead and rule out obstructive CAD as etiology for his heart failure. -Digoxin load successful we will continue 0.125 mg daily -He will continue amiodarone for rate control given significant systolic dysfunction, will not pursue beta-blockers at this time -We will continue Aldactone 25 mg daily and BiDil 2 tabs 3 times daily for afterload reduction -I will likely transition him off BiDil and onto Entresto after his heart catheterization and contrast load -Today we will continue with Lasix 120 mg IV twice daily for continue volume removal. -We will continue with aggressive potassium supplementation 40 mg twice daily with Lasix dosing.  Aldactone also is helping raise his potassium -After his heart catheterization we will then proceed the next day with a TEE cardioversion.  I would not want to interrupt his anticoagulation so I think it is prudent to get a heart catheterization first.  2.  New onset atrial fibrillation with rapid ventricular response -Unclear if this actually caused his cardiomyopathy or if the cardiomyopathy led to his atrial fibrillation -Continue amiodarone and digoxin for now -Continue heparin drip as he is above planned procedures -He will have a left heart cath and right heart cath tomorrow and then we will plan for a TEE cardioversion on Friday.  After his cardioversion we can transition him to Eliquis -I think we will also be able to get him on a beta-blocker after his cardioversion and get a  better idea of his hemodynamics with his right heart catheterization  Please arrange transfer to [facility] where I will be the attending of record for this patient.  I think he is appropriate to go on our service as he has no other medical issues and simply seems to have cardiac conditions.    For questions or updates, please contact [place] Please consult [email or web address] for contact info under        Signed, [place] T. Audie Box, [place]  03/24/2019 10:58 AM

## 2019-03-25 ENCOUNTER — Encounter (HOSPITAL_COMMUNITY)
Admission: EM | Disposition: A | Discharge status: Home / Self Care | Payer: Self-pay | Source: Home / Self Care | Attending: Cardiovascular Disease

## 2019-03-25 ENCOUNTER — Encounter (HOSPITAL_COMMUNITY): Payer: Self-pay | Admitting: Cardiology

## 2019-03-25 DIAGNOSIS — I5021 Acute systolic (congestive) heart failure: Secondary | ICD-10-CM

## 2019-03-25 HISTORY — PX: RIGHT/LEFT HEART CATH AND CORONARY ANGIOGRAPHY: CATH118266

## 2019-03-25 LAB — POCT I-STAT EG7
Acid-Base Excess: 5 mmol/L — ABNORMAL HIGH (ref 0.0–2.0)
Bicarbonate: 30 mmol/L — ABNORMAL HIGH (ref 20.0–28.0)
Calcium, Ion: 1.13 mmol/L — ABNORMAL LOW (ref 1.15–1.40)
HCT: 43 % (ref 39.0–52.0)
Hemoglobin: 14.6 g/dL (ref 13.0–17.0)
O2 Saturation: 77 %
Potassium: 3.6 mmol/L (ref 3.5–5.1)
Sodium: 141 mmol/L (ref 135–145)
TCO2: 31 mmol/L (ref 22–32)
pCO2, Ven: 43.3 mmHg — ABNORMAL LOW (ref 44.0–60.0)
pH, Ven: 7.449 — ABNORMAL HIGH (ref 7.250–7.430)
pO2, Ven: 40 mmHg (ref 32.0–45.0)

## 2019-03-25 LAB — BASIC METABOLIC PANEL
Anion gap: 11 (ref 5–15)
Anion gap: 10 (ref 5–15)
BUN: 14 mg/dL (ref 6–20)
BUN: 14 mg/dL (ref 6–20)
CO2: 30 mmol/L (ref 22–32)
CO2: 29 mmol/L (ref 22–32)
Calcium: 8.9 mg/dL (ref 8.9–10.3)
Calcium: 9 mg/dL (ref 8.9–10.3)
Chloride: 100 mmol/L (ref 98–111)
Chloride: 100 mmol/L (ref 98–111)
Creatinine, Ser: 1.29 mg/dL — ABNORMAL HIGH (ref 0.61–1.24)
Creatinine, Ser: 1.29 mg/dL — ABNORMAL HIGH (ref 0.61–1.24)
GFR calc Af Amer: >60 mL/min (ref >60)
GFR calc Af Amer: >60 mL/min (ref >60)
GFR calc non Af Amer: >60 mL/min (ref >60)
GFR calc non Af Amer: >60 mL/min (ref >60)
Glucose, Bld: 105 mg/dL — ABNORMAL HIGH (ref 70–99)
Glucose, Bld: 143 mg/dL — ABNORMAL HIGH (ref 70–99)
Potassium: 3.4 mmol/L — ABNORMAL LOW (ref 3.5–5.1)
Potassium: 3.6 mmol/L (ref 3.5–5.1)
Sodium: 141 mmol/L (ref 135–145)
Sodium: 139 mmol/L (ref 135–145)

## 2019-03-25 LAB — MAGNESIUM: Magnesium: 1.8 mg/dL (ref 1.7–2.4)

## 2019-03-25 LAB — POCT I-STAT 7, (LYTES, BLD GAS, ICA,H+H)
Acid-Base Excess: 6 mmol/L — ABNORMAL HIGH (ref 0.0–2.0)
Bicarbonate: 30.6 mmol/L — ABNORMAL HIGH (ref 20.0–28.0)
Calcium, Ion: 1.13 mmol/L — ABNORMAL LOW (ref 1.15–1.40)
HCT: 42 % (ref 39.0–52.0)
Hemoglobin: 14.3 g/dL (ref 13.0–17.0)
O2 Saturation: 99 %
Potassium: 3.6 mmol/L (ref 3.5–5.1)
Sodium: 141 mmol/L (ref 135–145)
TCO2: 32 mmol/L (ref 22–32)
pCO2 arterial: 41.2 mmHg (ref 32.0–48.0)
pH, Arterial: 7.479 — ABNORMAL HIGH (ref 7.350–7.450)
pO2, Arterial: 115 mmHg — ABNORMAL HIGH (ref 83.0–108.0)

## 2019-03-25 LAB — CBC
HCT: 42 % (ref 39.0–52.0)
Hemoglobin: 13.8 g/dL (ref 13.0–17.0)
MCH: 31.2 pg (ref 26.0–34.0)
MCHC: 32.9 g/dL (ref 30.0–36.0)
MCV: 95 fL (ref 80.0–100.0)
Platelets: 290 10*3/uL (ref 150–400)
RBC: 4.42 MIL/uL (ref 4.22–5.81)
RDW: 14.6 % (ref 11.5–15.5)
WBC: 10.9 10*3/uL — ABNORMAL HIGH (ref 4.0–10.5)
nRBC: 0 % (ref 0.0–0.2)

## 2019-03-25 SURGERY — RIGHT/LEFT HEART CATH AND CORONARY ANGIOGRAPHY
Anesthesia: LOCAL

## 2019-03-25 MED ORDER — HEPARIN (PORCINE) 25000 UT/250ML-% IV SOLN
1500.0000 [IU]/h | INTRAVENOUS | Status: DC
  Administered 2019-03-25 – 2019-03-26 (×2): 1500 [IU]/h via INTRAVENOUS
  Filled 2019-03-25 (×2): qty 250

## 2019-03-25 MED ORDER — SODIUM CHLORIDE 0.9% FLUSH: 3.0000 mL | INTRAVENOUS | Status: DC | PRN

## 2019-03-25 MED ORDER — HEPARIN (PORCINE) IN NACL 1000-0.9 UT/500ML-% IV SOLN
INTRAVENOUS | Status: DC | PRN
  Administered 2019-03-25 (×2): 500 mL

## 2019-03-25 MED ORDER — HEPARIN SODIUM (PORCINE) 1000 UNIT/ML IJ SOLN
INTRAMUSCULAR | Status: AC
  Filled 2019-03-25: qty 1

## 2019-03-25 MED ORDER — SODIUM CHLORIDE 0.9 % IV SOLN: INTRAVENOUS | Status: DC

## 2019-03-25 MED ORDER — VERAPAMIL HCL 2.5 MG/ML IV SOLN
INTRAVENOUS | Status: DC | PRN
  Administered 2019-03-25: 10 mL via INTRA_ARTERIAL

## 2019-03-25 MED ORDER — SODIUM CHLORIDE 0.9 % IV SOLN: 250.0000 mL | INTRAVENOUS | Status: DC | PRN

## 2019-03-25 MED ORDER — FUROSEMIDE 10 MG/ML IJ SOLN
160.0000 mg | Freq: Once | INTRAVENOUS | Status: DC
  Filled 2019-03-25: qty 16

## 2019-03-25 MED ORDER — SODIUM CHLORIDE 0.9% FLUSH
3.0000 mL | Freq: Two times a day (BID) | INTRAVENOUS | Status: DC
  Administered 2019-03-25 – 2019-03-26 (×4): 3 mL via INTRAVENOUS

## 2019-03-25 MED ORDER — HEPARIN (PORCINE) IN NACL 1000-0.9 UT/500ML-% IV SOLN
INTRAVENOUS | Status: AC
  Filled 2019-03-25: qty 1000

## 2019-03-25 MED ORDER — MIDAZOLAM HCL 2 MG/2ML IJ SOLN
INTRAMUSCULAR | Status: DC | PRN
  Administered 2019-03-25: 1 mg via INTRAVENOUS

## 2019-03-25 MED ORDER — HEPARIN SODIUM (PORCINE) 1000 UNIT/ML IJ SOLN
INTRAMUSCULAR | Status: DC | PRN
  Administered 2019-03-25: 6000 [IU] via INTRAVENOUS

## 2019-03-25 MED ORDER — LIDOCAINE HCL (PF) 1 % IJ SOLN
INTRAMUSCULAR | Status: AC
  Filled 2019-03-25: qty 30

## 2019-03-25 MED ORDER — POTASSIUM CHLORIDE CRYS ER 20 MEQ PO TBCR
40.0000 meq | EXTENDED_RELEASE_TABLET | Freq: Once | ORAL | Status: AC
  Administered 2019-03-25: 40 meq via ORAL
  Filled 2019-03-25: qty 2

## 2019-03-25 MED ORDER — IOHEXOL 350 MG/ML SOLN
INTRAVENOUS | Status: DC | PRN
  Administered 2019-03-25: 85 mL

## 2019-03-25 MED ORDER — FENTANYL CITRATE (PF) 100 MCG/2ML IJ SOLN
INTRAMUSCULAR | Status: DC | PRN
  Administered 2019-03-25: 25 ug via INTRAVENOUS

## 2019-03-25 MED ORDER — FENTANYL CITRATE (PF) 100 MCG/2ML IJ SOLN
INTRAMUSCULAR | Status: AC
  Filled 2019-03-25: qty 2

## 2019-03-25 MED ORDER — FUROSEMIDE 10 MG/ML IJ SOLN
160.0000 mg | Freq: Once | INTRAVENOUS | Status: AC
  Administered 2019-03-25: 160 mg via INTRAVENOUS
  Filled 2019-03-25: qty 10

## 2019-03-25 MED ORDER — SODIUM CHLORIDE 0.9 % IV SOLN
250.0000 mL | INTRAVENOUS | Status: DC | PRN
  Administered 2019-03-26: 14:00:00 via INTRAVENOUS

## 2019-03-25 MED ORDER — POTASSIUM CHLORIDE CRYS ER 20 MEQ PO TBCR: 40.0000 meq | EXTENDED_RELEASE_TABLET | Freq: Two times a day (BID) | ORAL | Status: DC

## 2019-03-25 MED ORDER — MIDAZOLAM HCL 2 MG/2ML IJ SOLN
INTRAMUSCULAR | Status: AC
  Filled 2019-03-25: qty 2

## 2019-03-25 MED ORDER — SODIUM CHLORIDE 0.9 % IV SOLN
INTRAVENOUS | Status: DC
  Administered 2019-03-25: 09:00:00 via INTRAVENOUS

## 2019-03-25 MED ORDER — LIDOCAINE HCL (PF) 1 % IJ SOLN
INTRAMUSCULAR | Status: DC | PRN
  Administered 2019-03-25: 5 mL

## 2019-03-25 MED ORDER — VERAPAMIL HCL 2.5 MG/ML IV SOLN
INTRAVENOUS | Status: AC
  Filled 2019-03-25: qty 2

## 2019-03-25 SURGICAL SUPPLY — 14 items
CATH 5FR JL3.5 JR4 ANG PIG MP (CATHETERS) ×1 IMPLANT
CATH BALLN WEDGE 5F 110CM (CATHETERS) ×1 IMPLANT
DEVICE RAD COMP TR BAND LRG (VASCULAR PRODUCTS) ×1 IMPLANT
GLIDESHEATH SLEND SS 6F .021 (SHEATH) ×1 IMPLANT
GUIDEWIRE .025 260CM (WIRE) ×1 IMPLANT
GUIDEWIRE INQWIRE 1.5J.035X260 (WIRE) IMPLANT
HOVERMATT SINGLE USE (MISCELLANEOUS) ×1 IMPLANT
INQWIRE 1.5J .035X260CM (WIRE) ×2
KIT HEART LEFT (KITS) ×2 IMPLANT
PACK CARDIAC CATHETERIZATION (CUSTOM PROCEDURE TRAY) ×2 IMPLANT
SHEATH GLIDE SLENDER 4/5FR (SHEATH) ×2 IMPLANT
SHEATH PROBE COVER 6X72 (BAG) ×1 IMPLANT
TRANSDUCER W/STOPCOCK (MISCELLANEOUS) ×2 IMPLANT
TUBING CIL FLEX 10 FLL-RA (TUBING) ×2 IMPLANT

## 2019-03-25 NOTE — Progress Notes (Signed)
Cardiology Progress Note  Patient ID: Parker Calderon MRN: [id number] DOB: [birth year] Date of Encounter: 03/25/2019  Primary Cardiologist: Evalina Field, MD  Subjective  He still remains in atrial fibrillation with RVR.  Net negative nearly 2 L.  Lower extreme edema much improved.  He reports his breathing is much improved.  Denies chest pain or shortness of breath.  ROS:  All other ROS reviewed and negative. Pertinent positives noted in the HPI.     Inpatient Medications  Scheduled Meds: . atorvastatin  40 mg Oral q1800  . Chlorhexidine Gluconate Cloth  6 each Topical Daily  . digoxin  0.125 mg Oral Daily  . isosorbide-hydrALAZINE  2 tablet Oral TID  . mupirocin ointment  1 application Nasal BID  . potassium chloride  40 mEq Oral Q12H  . sodium chloride flush  3 mL Intravenous Q12H  . spironolactone  25 mg Oral Daily   Continuous Infusions: . sodium chloride    . sodium chloride    . amiodarone 30 mg/hr (03/24/19 2200)  . furosemide Stopped (03/24/19 1946)  . heparin 1,500 Units/hr (03/25/19 0408)   PRN Meds: sodium chloride, acetaminophen, sodium chloride flush   Vital Signs   Vitals:   03/25/19 0500 03/25/19 0547 03/25/19 0741 03/25/19 0800  BP: (!) 133/56  (!) 135/92   Pulse: 92  95 (!) 102  Resp: (!) 22  (!) 26 (!) 29  Temp:  98.3 F (36.8 C) 98.4 F (36.9 C)   TempSrc:  Oral Oral   SpO2: 99%  94% 95%  Weight: (!) 156.8 kg (!) 157.9 kg    Height:        Intake/Output Summary (Last 24 hours) at 03/25/2019 0831 Last data filed at 03/25/2019 0743 Gross per 24 hour  Intake 1087.76 ml  Output 3300 ml  Net -2212.24 ml   Last 3 Weights 03/25/2019 03/25/2019 03/23/2019  Weight (lbs) 348 lb 1.7 oz 345 lb 10.9 oz 364 lb 6.7 oz  Weight (kg) 157.9 kg 156.8 kg 165.3 kg      Telemetry  Overnight telemetry shows atrial fibrillation with rate in the 90-110 range, which I personally reviewed.   ECG  The most recent ECG shows atrial fibrillation with RVR, which I  personally reviewed.   Physical Exam   Vitals:   03/25/19 0500 03/25/19 0547 03/25/19 0741 03/25/19 0800  BP: (!) 133/56  (!) 135/92   Pulse: 92  95 (!) 102  Resp: (!) 22  (!) 26 (!) 29  Temp:  98.3 F (36.8 C) 98.4 F (36.9 C)   TempSrc:  Oral Oral   SpO2: 99%  94% 95%  Weight: (!) 156.8 kg (!) 157.9 kg    Height:         Intake/Output Summary (Last 24 hours) at 03/25/2019 0831 Last data filed at 03/25/2019 0743 Gross per 24 hour  Intake 1087.76 ml  Output 3300 ml  Net -2212.24 ml    Last 3 Weights 03/25/2019 03/25/2019 03/23/2019  Weight (lbs) 348 lb 1.7 oz 345 lb 10.9 oz 364 lb 6.7 oz  Weight (kg) 157.9 kg 156.8 kg 165.3 kg    Body mass index is 48.55 kg/m.  General: Obese male, no acute distress Head: Atraumatic, normal size  Eyes: PEERLA, EOMI  Neck: JVD difficult to assess due to neck adiposity Endocrine: No thryomegaly Cardiac: Normal S1, S2; RRR; no murmurs, rubs, or gallops Lungs: Crackles noted at lung bases Abd: Soft, nontender, no hepatomegaly  Ext: 1+ pitting edema Musculoskeletal: No  deformities, BUE and BLE strength normal and equal Skin: Warm and dry, no rashes   Neuro: Alert and oriented to person, place, time, and situation, CNII-XII grossly intact, no focal deficits  Psych: Normal mood and affect   Labs  High Sensitivity Troponin:   Recent Labs  Lab 03/21/19 2205 03/22/19 0334  TROPONINIHS 28* 25*     Cardiac EnzymesNo results for input(s): TROPONINI in the last 168 hours. No results for input(s): TROPIPOC in the last 168 hours.  Chemistry Recent Labs  Lab 03/23/19 0032  03/24/19 0529 03/24/19 1556 03/25/19 0659  NA 141   < > 140 139 141  K 3.7   < > 3.5 3.3* 3.4*  CL 103   < > 102 99 100  CO2 28   < > 29 30 30   GLUCOSE 109*   < > 108* 110* 105*  BUN 15   < > 14 18 14   CREATININE 1.07   < > 0.99 1.21 1.29*  CALCIUM 8.8*   < > 8.7* 9.1 8.9  PROT  --   --  6.1*  --   --   ALBUMIN 3.3*  --  3.3*  --   --   AST  --   --  21  --   --    ALT  --   --  15  --   --   ALKPHOS  --   --  49  --   --   BILITOT  --   --  1.2  --   --   GFRNONAA >60   < > >60 >60 >60  GFRAA >60   < > >60 >60 >60  ANIONGAP 10   < > 9 10 11    < > = values in this interval not displayed.    Hematology Recent Labs  Lab 03/23/19 0032 03/24/19 0529 03/25/19 0659  WBC 11.5* 11.9* 10.9*  RBC 4.30 4.34 4.42  HGB 13.5 13.7 13.8  HCT 43.0 43.2 42.0  MCV 100.0 99.5 95.0  MCH 31.4 31.6 31.2  MCHC 31.4 31.7 32.9  RDW 14.9 14.7 14.6  PLT 283 265 290   BNP Recent Labs  Lab 03/21/19 2245  BNP 261.6*    DDimer No results for input(s): DDIMER in the last 168 hours.   Radiology  No results found.  Cardiac Studies  TTE 03/22/2019  1. Left ventricular ejection fraction, by visual estimation, is <20%. The left ventricle has severely decreased function. There is no left ventricular hypertrophy.  2. Definity contrast agent was given IV to delineate the left ventricular endocardial borders.  3. Moderately dilated left ventricular internal cavity size.  4. The left ventricle demonstrates global hypokinesis.  5. Left ventricular diastolic parameters are indeterminate.  6. LV thrombus excluded by echo contrast.  7. Global right ventricle has severely reduced systolic function.The right ventricular size is not well visualized. Right vetricular wall thickness was not assessed.  8. Left atrial size was normal.  9. Right atrial size was normal. 10. Mild to moderate aortic valve annular calcification. 11. The mitral valve is normal in structure. Mild to moderate mitral valve regurgitation. 12. The tricuspid valve is normal in structure. Tricuspid valve regurgitation mild-moderate. 13. The aortic valve is tricuspid. Aortic valve regurgitation is not visualized. No evidence of aortic valve sclerosis or stenosis. 14. The pulmonic valve was not well visualized. Pulmonic valve regurgitation is not visualized. 15. Mildly elevated pulmonary artery systolic  pressure. 16. The inferior vena cava is dilated  in size with <50% respiratory variability, suggesting right atrial pressure of 15 mmHg.  Patient Profile  Parker Calderon is a 58 y.o. male with morbid obesity who was admitted on 11/2 for newly diagnosed atrial fibrillation with RVR, newly diagnosed systolic heart failure ejection fraction around 15%, volume overload.  Assessment & Plan  1.  Acute decompensated systolic heart failure, ejection fraction 15%, unclear etiology at this time -Ejection fraction around 15%, with unclear etiology.  TSH was normal, no significant alcohol or drug abuse.  Iron studies normal.  Likely he has morbid obesity and untreated sleep apnea that led to his systolic heart failure.  Atrial fibrillation could be at play here to. -He is n.p.o. for left and right heart cath today.  I would like to exclude obstructive CAD with a left heart cath.  I also would like to get an idea what his filling pressures as well as cardiac output/index will be given how reduce his ejection fraction was on admission.  He is warm and well perfused on exam.  I suspect we are getting close to euvolemia. -Continue to his digoxin 0.125 mg daily -He will continue amiodarone for rate control given significant systolic dysfunction, will not pursue beta-blockers at this time.  Likely will transition a beta-blocker once I know what his output and index are. -We will continue Aldactone 25 mg daily and BiDil 2 tabs 3 times daily for afterload reduction -I will likely transition him off BiDil and onto Entresto after his heart catheterization and contrast load -I will give him 120 mg of Lasix x1 today due to contrast load from cardiac cath -After his heart catheterization we will then proceed the next day with a TEE cardioversion.  I would not want to interrupt his anticoagulation so I think it is prudent to get a heart catheterization first.  2.  New onset atrial fibrillation with rapid ventricular response  -Unclear if this actually caused his cardiomyopathy or if the cardiomyopathy led to his atrial fibrillation -Continue amiodarone and digoxin for now -Continue heparin drip as he is above planned procedures -Plan for TEE/cardioversion tomorrow.  N.p.o. at midnight  3.  Hypokalemia -Related to aggressive diuresis continue to replace  4.  Acute kidney injury -Secondary to aggressive diuresis and volume overload -We will continue to monitor with daily BMP  5.  Hypertension -Controlled on current heart failure medication  6.  Hyperlipidemia -Continue statin  7.  Morbid obesity with likely undiagnosed obstructive sleep apnea and obesity hypoventilation syndrome -We will plan to pursue an outpatient sleep study  FEN - no IVF - npo - dvt ppx: heparin drip - code: full   Please arrange transfer to [facility] where I will be the attending of record for this patient.  I think he is appropriate to go on our service as he has no other medical issues and simply seems to have cardiac conditions.    For questions or updates, please contact [place] Please consult [email or web address] for contact info under        Signed, Lake Bells T. Audie Box, [place]  03/25/2019 8:31 AM

## 2019-03-25 NOTE — Interval H&P Note (Signed)
History and Physical Interval Note:  03/25/2019 11:31 AM  Parker Calderon  has presented today for surgery, with the diagnosis of cardiomyopathy.  The various methods of treatment have been discussed with the patient and family. After consideration of risks, benefits and other options for treatment, the patient has consented to  Procedure(s): RIGHT/LEFT HEART CATH AND CORONARY ANGIOGRAPHY (N/A) as a surgical intervention.  The patient's history has been reviewed, patient examined, no change in status, stable for surgery.  I have reviewed the patient's chart and labs.  Questions were answered to the patient's satisfaction.   Cath Lab Visit (complete for each Cath Lab visit)  Clinical Evaluation Leading to the Procedure:   ACS: No.  Non-ACS:    Anginal Classification: CCS II  Anti-ischemic medical therapy: No Therapy  Non-Invasive Test Results: No non-invasive testing performed  Prior CABG: No previous CABG        Collier [facility] 03/25/2019 11:31 AM

## 2019-03-25 NOTE — H&P (View-Only) (Signed)
Cardiology Progress Note  Patient ID: Parker Calderon MRN: [id number] DOB: [birth year] Date of Encounter: 03/25/2019  Primary Cardiologist: Evalina Field, MD  Subjective  He still remains in atrial fibrillation with RVR.  Net negative nearly 2 L.  Lower extreme edema much improved.  He reports his breathing is much improved.  Denies chest pain or shortness of breath.  ROS:  All other ROS reviewed and negative. Pertinent positives noted in the HPI.     Inpatient Medications  Scheduled Meds: . atorvastatin  40 mg Oral q1800  . Chlorhexidine Gluconate Cloth  6 each Topical Daily  . digoxin  0.125 mg Oral Daily  . isosorbide-hydrALAZINE  2 tablet Oral TID  . mupirocin ointment  1 application Nasal BID  . potassium chloride  40 mEq Oral Q12H  . sodium chloride flush  3 mL Intravenous Q12H  . spironolactone  25 mg Oral Daily   Continuous Infusions: . sodium chloride    . sodium chloride    . amiodarone 30 mg/hr (03/24/19 2200)  . furosemide Stopped (03/24/19 1946)  . heparin 1,500 Units/hr (03/25/19 0408)   PRN Meds: sodium chloride, acetaminophen, sodium chloride flush   Vital Signs   Vitals:   03/25/19 0500 03/25/19 0547 03/25/19 0741 03/25/19 0800  BP: (!) 133/56  (!) 135/92   Pulse: 92  95 (!) 102  Resp: (!) 22  (!) 26 (!) 29  Temp:  98.3 F (36.8 C) 98.4 F (36.9 C)   TempSrc:  Oral Oral   SpO2: 99%  94% 95%  Weight: (!) 156.8 kg (!) 157.9 kg    Height:        Intake/Output Summary (Last 24 hours) at 03/25/2019 0831 Last data filed at 03/25/2019 0743 Gross per 24 hour  Intake 1087.76 ml  Output 3300 ml  Net -2212.24 ml   Last 3 Weights 03/25/2019 03/25/2019 03/23/2019  Weight (lbs) 348 lb 1.7 oz 345 lb 10.9 oz 364 lb 6.7 oz  Weight (kg) 157.9 kg 156.8 kg 165.3 kg      Telemetry  Overnight telemetry shows atrial fibrillation with rate in the 90-110 range, which I personally reviewed.   ECG  The most recent ECG shows atrial fibrillation with RVR, which I  personally reviewed.   Physical Exam   Vitals:   03/25/19 0500 03/25/19 0547 03/25/19 0741 03/25/19 0800  BP: (!) 133/56  (!) 135/92   Pulse: 92  95 (!) 102  Resp: (!) 22  (!) 26 (!) 29  Temp:  98.3 F (36.8 C) 98.4 F (36.9 C)   TempSrc:  Oral Oral   SpO2: 99%  94% 95%  Weight: (!) 156.8 kg (!) 157.9 kg    Height:         Intake/Output Summary (Last 24 hours) at 03/25/2019 0831 Last data filed at 03/25/2019 0743 Gross per 24 hour  Intake 1087.76 ml  Output 3300 ml  Net -2212.24 ml    Last 3 Weights 03/25/2019 03/25/2019 03/23/2019  Weight (lbs) 348 lb 1.7 oz 345 lb 10.9 oz 364 lb 6.7 oz  Weight (kg) 157.9 kg 156.8 kg 165.3 kg    Body mass index is 48.55 kg/m.  General: Obese male, no acute distress Head: Atraumatic, normal size  Eyes: PEERLA, EOMI  Neck: JVD difficult to assess due to neck adiposity Endocrine: No thryomegaly Cardiac: Normal S1, S2; RRR; no murmurs, rubs, or gallops Lungs: Crackles noted at lung bases Abd: Soft, nontender, no hepatomegaly  Ext: 1+ pitting edema Musculoskeletal: No  deformities, BUE and BLE strength normal and equal Skin: Warm and dry, no rashes   Neuro: Alert and oriented to person, place, time, and situation, CNII-XII grossly intact, no focal deficits  Psych: Normal mood and affect   Labs  High Sensitivity Troponin:   Recent Labs  Lab 03/21/19 2205 03/22/19 0334  TROPONINIHS 28* 25*     Cardiac EnzymesNo results for input(s): TROPONINI in the last 168 hours. No results for input(s): TROPIPOC in the last 168 hours.  Chemistry Recent Labs  Lab 03/23/19 0032  03/24/19 0529 03/24/19 1556 03/25/19 0659  NA 141   < > 140 139 141  K 3.7   < > 3.5 3.3* 3.4*  CL 103   < > 102 99 100  CO2 28   < > 29 30 30   GLUCOSE 109*   < > 108* 110* 105*  BUN 15   < > 14 18 14   CREATININE 1.07   < > 0.99 1.21 1.29*  CALCIUM 8.8*   < > 8.7* 9.1 8.9  PROT  --   --  6.1*  --   --   ALBUMIN 3.3*  --  3.3*  --   --   AST  --   --  21  --   --    ALT  --   --  15  --   --   ALKPHOS  --   --  49  --   --   BILITOT  --   --  1.2  --   --   GFRNONAA >60   < > >60 >60 >60  GFRAA >60   < > >60 >60 >60  ANIONGAP 10   < > 9 10 11    < > = values in this interval not displayed.    Hematology Recent Labs  Lab 03/23/19 0032 03/24/19 0529 03/25/19 0659  WBC 11.5* 11.9* 10.9*  RBC 4.30 4.34 4.42  HGB 13.5 13.7 13.8  HCT 43.0 43.2 42.0  MCV 100.0 99.5 95.0  MCH 31.4 31.6 31.2  MCHC 31.4 31.7 32.9  RDW 14.9 14.7 14.6  PLT 283 265 290   BNP Recent Labs  Lab 03/21/19 2245  BNP 261.6*    DDimer No results for input(s): DDIMER in the last 168 hours.   Radiology  No results found.  Cardiac Studies  TTE 03/22/2019  1. Left ventricular ejection fraction, by visual estimation, is <20%. The left ventricle has severely decreased function. There is no left ventricular hypertrophy.  2. Definity contrast agent was given IV to delineate the left ventricular endocardial borders.  3. Moderately dilated left ventricular internal cavity size.  4. The left ventricle demonstrates global hypokinesis.  5. Left ventricular diastolic parameters are indeterminate.  6. LV thrombus excluded by echo contrast.  7. Global right ventricle has severely reduced systolic function.The right ventricular size is not well visualized. Right vetricular wall thickness was not assessed.  8. Left atrial size was normal.  9. Right atrial size was normal. 10. Mild to moderate aortic valve annular calcification. 11. The mitral valve is normal in structure. Mild to moderate mitral valve regurgitation. 12. The tricuspid valve is normal in structure. Tricuspid valve regurgitation mild-moderate. 13. The aortic valve is tricuspid. Aortic valve regurgitation is not visualized. No evidence of aortic valve sclerosis or stenosis. 14. The pulmonic valve was not well visualized. Pulmonic valve regurgitation is not visualized. 15. Mildly elevated pulmonary artery systolic  pressure. 16. The inferior vena cava is dilated  in size with <50% respiratory variability, suggesting right atrial pressure of 15 mmHg.  Patient Profile  Parker Calderon is a 58 y.o. male with morbid obesity who was admitted on 11/2 for newly diagnosed atrial fibrillation with RVR, newly diagnosed systolic heart failure ejection fraction around 15%, volume overload.  Assessment & Plan  1.  Acute decompensated systolic heart failure, ejection fraction 15%, unclear etiology at this time -Ejection fraction around 15%, with unclear etiology.  TSH was normal, no significant alcohol or drug abuse.  Iron studies normal.  Likely he has morbid obesity and untreated sleep apnea that led to his systolic heart failure.  Atrial fibrillation could be at play here to. -He is n.p.o. for left and right heart cath today.  I would like to exclude obstructive CAD with a left heart cath.  I also would like to get an idea what his filling pressures as well as cardiac output/index will be given how reduce his ejection fraction was on admission.  He is warm and well perfused on exam.  I suspect we are getting close to euvolemia. -Continue to his digoxin 0.125 mg daily -He will continue amiodarone for rate control given significant systolic dysfunction, will not pursue beta-blockers at this time.  Likely will transition a beta-blocker once I know what his output and index are. -We will continue Aldactone 25 mg daily and BiDil 2 tabs 3 times daily for afterload reduction -I will likely transition him off BiDil and onto Entresto after his heart catheterization and contrast load -I will give him 120 mg of Lasix x1 today due to contrast load from cardiac cath -After his heart catheterization we will then proceed the next day with a TEE cardioversion.  I would not want to interrupt his anticoagulation so I think it is prudent to get a heart catheterization first.  2.  New onset atrial fibrillation with rapid ventricular response  -Unclear if this actually caused his cardiomyopathy or if the cardiomyopathy led to his atrial fibrillation -Continue amiodarone and digoxin for now -Continue heparin drip as he is above planned procedures -Plan for TEE/cardioversion tomorrow.  N.p.o. at midnight  3.  Hypokalemia -Related to aggressive diuresis continue to replace  4.  Acute kidney injury -Secondary to aggressive diuresis and volume overload -We will continue to monitor with daily BMP  5.  Hypertension -Controlled on current heart failure medication  6.  Hyperlipidemia -Continue statin  7.  Morbid obesity with likely undiagnosed obstructive sleep apnea and obesity hypoventilation syndrome -We will plan to pursue an outpatient sleep study  FEN - no IVF - npo - dvt ppx: heparin drip - code: full   Please arrange transfer to [facility] where I will be the attending of record for this patient.  I think he is appropriate to go on our service as he has no other medical issues and simply seems to have cardiac conditions.    For questions or updates, please contact [place] Please consult [email or web address] for contact info under        Signed, Lake Bells T. Audie Box, [place]  03/25/2019 8:31 AM

## 2019-03-26 ENCOUNTER — Inpatient Hospital Stay (HOSPITAL_COMMUNITY): Payer: BC Managed Care – PPO | Admitting: Anesthesiology

## 2019-03-26 ENCOUNTER — Telehealth: Payer: Self-pay | Admitting: Cardiovascular Disease

## 2019-03-26 ENCOUNTER — Inpatient Hospital Stay (HOSPITAL_COMMUNITY): Payer: BC Managed Care – PPO

## 2019-03-26 ENCOUNTER — Encounter (HOSPITAL_COMMUNITY)
Admission: EM | Disposition: A | Discharge status: Home / Self Care | Payer: Self-pay | Source: Home / Self Care | Attending: Cardiovascular Disease

## 2019-03-26 ENCOUNTER — Encounter (HOSPITAL_COMMUNITY): Payer: Self-pay | Admitting: *Deleted

## 2019-03-26 DIAGNOSIS — I161 Hypertensive emergency: Secondary | ICD-10-CM | POA: Diagnosis not present

## 2019-03-26 DIAGNOSIS — E785 Hyperlipidemia, unspecified: Secondary | ICD-10-CM | POA: Diagnosis not present

## 2019-03-26 DIAGNOSIS — I110 Hypertensive heart disease with heart failure: Secondary | ICD-10-CM | POA: Diagnosis not present

## 2019-03-26 DIAGNOSIS — I5021 Acute systolic (congestive) heart failure: Secondary | ICD-10-CM | POA: Diagnosis not present

## 2019-03-26 DIAGNOSIS — I340 Nonrheumatic mitral (valve) insufficiency: Secondary | ICD-10-CM | POA: Diagnosis not present

## 2019-03-26 DIAGNOSIS — Z6841 Body Mass Index (BMI) 40.0 and over, adult: Secondary | ICD-10-CM | POA: Diagnosis not present

## 2019-03-26 DIAGNOSIS — I342 Nonrheumatic mitral (valve) stenosis: Secondary | ICD-10-CM

## 2019-03-26 DIAGNOSIS — I4891 Unspecified atrial fibrillation: Secondary | ICD-10-CM | POA: Diagnosis not present

## 2019-03-26 HISTORY — PX: TEE WITHOUT CARDIOVERSION: SHX5443

## 2019-03-26 HISTORY — PX: CARDIOVERSION: SHX1299

## 2019-03-26 LAB — CBC
HCT: 42.9 % (ref 39.0–52.0)
Hemoglobin: 13.9 g/dL (ref 13.0–17.0)
MCH: 31.3 pg (ref 26.0–34.0)
MCHC: 32.4 g/dL (ref 30.0–36.0)
MCV: 96.6 fL (ref 80.0–100.0)
Platelets: 249 10*3/uL (ref 150–400)
RBC: 4.44 MIL/uL (ref 4.22–5.81)
RDW: 14.6 % (ref 11.5–15.5)
WBC: 10.7 10*3/uL — ABNORMAL HIGH (ref 4.0–10.5)
nRBC: 0 % (ref 0.0–0.2)

## 2019-03-26 LAB — BASIC METABOLIC PANEL
Anion gap: 10 (ref 5–15)
BUN: 15 mg/dL (ref 6–20)
CO2: 30 mmol/L (ref 22–32)
Calcium: 8.8 mg/dL — ABNORMAL LOW (ref 8.9–10.3)
Chloride: 100 mmol/L (ref 98–111)
Creatinine, Ser: 1.28 mg/dL — ABNORMAL HIGH (ref 0.61–1.24)
GFR calc Af Amer: >60 mL/min (ref >60)
GFR calc non Af Amer: >60 mL/min (ref >60)
Glucose, Bld: 108 mg/dL — ABNORMAL HIGH (ref 70–99)
Potassium: 3.8 mmol/L (ref 3.5–5.1)
Sodium: 140 mmol/L (ref 135–145)

## 2019-03-26 LAB — APTT: aPTT: 46 seconds — ABNORMAL HIGH (ref 24–36)

## 2019-03-26 LAB — MAGNESIUM: Magnesium: 1.9 mg/dL (ref 1.7–2.4)

## 2019-03-26 SURGERY — ECHOCARDIOGRAM, TRANSESOPHAGEAL
Anesthesia: Monitor Anesthesia Care

## 2019-03-26 MED ORDER — APIXABAN 5 MG PO TABS
5.0000 mg | ORAL_TABLET | Freq: Two times a day (BID) | ORAL | Status: DC
  Administered 2019-03-26 – 2019-03-27 (×2): 5 mg via ORAL
  Filled 2019-03-26 (×2): qty 1

## 2019-03-26 MED ORDER — SODIUM CHLORIDE 0.9% FLUSH: 3.0000 mL | INTRAVENOUS | Status: DC | PRN

## 2019-03-26 MED ORDER — FUROSEMIDE 10 MG/ML IJ SOLN
160.0000 mg | Freq: Once | INTRAVENOUS | Status: AC
  Administered 2019-03-26: 160 mg via INTRAVENOUS
  Filled 2019-03-26: qty 16

## 2019-03-26 MED ORDER — SACUBITRIL-VALSARTAN 24-26 MG PO TABS
1.0000 | ORAL_TABLET | Freq: Two times a day (BID) | ORAL | Status: DC
  Administered 2019-03-26 – 2019-03-27 (×3): 1 via ORAL
  Filled 2019-03-26 (×3): qty 1

## 2019-03-26 MED ORDER — SODIUM CHLORIDE 0.9% FLUSH
3.0000 mL | Freq: Two times a day (BID) | INTRAVENOUS | Status: DC
  Administered 2019-03-26 – 2019-03-27 (×2): 3 mL via INTRAVENOUS

## 2019-03-26 MED ORDER — APIXABAN 5 MG PO TABS
10.0000 mg | ORAL_TABLET | Freq: Once | ORAL | Status: AC
  Administered 2019-03-26: 10 mg via ORAL
  Filled 2019-03-26: qty 2

## 2019-03-26 MED ORDER — PROPOFOL 500 MG/50ML IV EMUL
INTRAVENOUS | Status: DC | PRN
  Administered 2019-03-26: 100 ug/kg/min via INTRAVENOUS

## 2019-03-26 MED ORDER — SODIUM CHLORIDE 0.9 % IV SOLN: 250.0000 mL | INTRAVENOUS | Status: DC

## 2019-03-26 MED ORDER — SODIUM CHLORIDE 0.9 % IV SOLN
INTRAVENOUS | Status: DC
  Administered 2019-03-26: 14:00:00 via INTRAVENOUS

## 2019-03-26 MED ORDER — AMIODARONE HCL 200 MG PO TABS: 200.0000 mg | ORAL_TABLET | Freq: Every day | ORAL | Status: DC

## 2019-03-26 MED ORDER — AMIODARONE HCL 200 MG PO TABS
400.0000 mg | ORAL_TABLET | Freq: Two times a day (BID) | ORAL | Status: DC
  Administered 2019-03-26 – 2019-03-27 (×3): 400 mg via ORAL
  Filled 2019-03-26 (×3): qty 2

## 2019-03-26 MED ORDER — PROPOFOL 10 MG/ML IV BOLUS
INTRAVENOUS | Status: DC | PRN
  Administered 2019-03-26: 50 mg via INTRAVENOUS

## 2019-03-26 MED ORDER — FUROSEMIDE 10 MG/ML IJ SOLN
120.0000 mg | Freq: Once | INTRAVENOUS | Status: DC
  Filled 2019-03-26: qty 12

## 2019-03-26 MED ORDER — POTASSIUM CHLORIDE CRYS ER 20 MEQ PO TBCR
40.0000 meq | EXTENDED_RELEASE_TABLET | Freq: Once | ORAL | Status: AC
  Administered 2019-03-26: 40 meq via ORAL
  Filled 2019-03-26: qty 2

## 2019-03-26 NOTE — Progress Notes (Addendum)
  Echocardiogram 2D Echocardiogram Transesophageal has been performed.  Parker Calderon 03/26/2019, 2:30 PM

## 2019-03-26 NOTE — Telephone Encounter (Signed)
New message  Scheduled TOC visit via [facility] with Dr. Audie Box on 03/31/19 at 9:20am.

## 2019-03-26 NOTE — Interval H&P Note (Signed)
History and Physical Interval Note:  03/26/2019 1:19 PM  Parker Calderon  has presented today for surgery, with the diagnosis of afib.  The various methods of treatment have been discussed with the patient and family. After consideration of risks, benefits and other options for treatment, the patient has consented to  Procedure(s): TRANSESOPHAGEAL ECHOCARDIOGRAM (TEE) (N/A) CARDIOVERSION (N/A) as a surgical intervention.  The patient's history has been reviewed, patient examined, no change in status, stable for surgery.  I have reviewed the patient's chart and labs.  Questions were answered to the patient's satisfaction.     Jenkins Rouge

## 2019-03-26 NOTE — Anesthesia Preprocedure Evaluation (Signed)
Anesthesia Evaluation  Patient identified by MRN, date of birth, ID band Patient awake    Reviewed: Allergy & Precautions, NPO status , Patient's Chart, lab work & pertinent test results  Airway Mallampati: I  TM Distance: >3 FB Neck ROM: Full    Dental   Pulmonary    Pulmonary exam normal        Cardiovascular Normal cardiovascular exam     Neuro/Psych    GI/Hepatic   Endo/Other    Renal/GU      Musculoskeletal   Abdominal   Peds  Hematology   Anesthesia Other Findings   Reproductive/Obstetrics                             Anesthesia Physical Anesthesia Plan  ASA: III  Anesthesia Plan: General   Post-op Pain Management:    Induction: Intravenous  PONV Risk Score and Plan: 2  Airway Management Planned: Nasal Cannula  Additional Equipment:   Intra-op Plan:   Post-operative Plan:   Informed Consent: I have reviewed the patients History and Physical, chart, labs and discussed the procedure including the risks, benefits and alternatives for the proposed anesthesia with the patient or authorized representative who has indicated his/her understanding and acceptance.       Plan Discussed with: CRNA and Surgeon  Anesthesia Plan Comments:         Anesthesia Quick Evaluation

## 2019-03-26 NOTE — TOC Benefit Eligibility Note (Signed)
Transition of Care [facility]) Benefit Eligibility Note    Patient Details  Name: Parker Calderon MRN: [id number] Date of Birth: February 27, 1961   Medication/Dose: Delene Loll 24-26mg  twice a day  Covered?: Yes  Tier: Other(Unavailable)  Prescription Coverage Preferred Pharmacy: [facility] with Person/Company/Phone Number:: Tanisha/ [facility] [place]/ [phone]  Co-Pay: 455.00 for a 30 day supply  Prior Approval: No  Deductible: (Unavailable)  Additional Notes: Alternative may be Valsartan no copay for a 40mg  twice a day 30 day supply    Orbie Pyo Phone Number: 03/26/2019, 8:47 AM

## 2019-03-26 NOTE — TOC Progression Note (Signed)
Transition of Care [facility]) - Progression Note    Patient Details  Name: Parker Calderon MRN: [id number] Date of Birth: Oct 09, 1960  Transition of Care [facility]) CM/SW Contact  Zenon Mayo, RN Phone Number: 03/26/2019, 2:32 PM  Clinical Narrative:    Patient is form home, s/p cath, and TEE/Cardioversion, will be on eliquis and entresto, NCM gave coupons for 30 day free for both meds and the 10.00 co pay card for each med to Staff RN.  Patient was in a procedure.  Patient is for dc tomorrow.         Expected Discharge Plan and Services                                                 Social Determinants of Health (SDOH) Interventions    Readmission Risk Interventions No flowsheet data found.

## 2019-03-26 NOTE — Anesthesia Postprocedure Evaluation (Signed)
Anesthesia Post Note  Patient: KOSUKE CALL  Procedure(s) Performed: TRANSESOPHAGEAL ECHOCARDIOGRAM (TEE) (N/A ) CARDIOVERSION (N/A )     Patient location during evaluation: Endoscopy Anesthesia Type: General Level of consciousness: awake and alert Pain management: pain level controlled Vital Signs Assessment: post-procedure vital signs reviewed and stable Respiratory status: spontaneous breathing, nonlabored ventilation, respiratory function stable and patient connected to nasal cannula oxygen Cardiovascular status: blood pressure returned to baseline and stable Postop Assessment: no apparent nausea or vomiting Anesthetic complications: no    Last Vitals:  Vitals:   03/26/19 1522 03/26/19 2041  BP: (!) 141/91 103/72  Pulse:    Resp: 12   Temp:  36.6 C  SpO2:      Last Pain:  Vitals:   03/26/19 2041  TempSrc: Oral  PainSc: 0-No pain                 Jourdyn Ferrin P Dazha Kempa

## 2019-03-26 NOTE — Discharge Instructions (Signed)
Heart Failure Education: 1. Weigh yourself EVERY morning after you go to the bathroom but before you eat or drink anything. Write this number down in a weight log/diary. If you gain 3 pounds overnight or 5 pounds in a week, call the office. 2. Take your medicines as prescribed. If you have concerns about your medications, please call us before you stop taking them.  3. Eat low salt foods--Limit salt (sodium) to 2000 mg per day. This will help prevent your body from holding onto fluid. Read food labels as many processed foods have a lot of sodium, especially canned goods and prepackaged meats. If you would like some assistance choosing low sodium foods, we would be happy to set you up with a nutritionist. 4. Stay as active as you can everyday. Staying active will give you more energy and make your muscles stronger. Start with 5 minutes at a time and work your way up to 30 minutes a day. Break up your activities--do some in the morning and some in the afternoon. Start with 3 days per week and work your way up to 5 days as you can.  If you have chest pain, feel short of breath, dizzy, or lightheaded, STOP. If you don't feel better after a short rest, call 911. If you do feel better, call the office to let us know you have symptoms with exercise. 5. Limit all fluids for the day to less than 2 liters. Fluid includes all drinks, coffee, juice, ice chips, soup, jello, and all other liquids.    Atrial Fibrillation  Atrial fibrillation is a type of heartbeat that is irregular or fast (rapid). If you have this condition, your heart beats without any order. This makes it hard for your heart to pump blood in a normal way. Having this condition gives you more risk for stroke, heart failure, and other heart problems. Atrial fibrillation may start all of a sudden and then stop on its own, or it may become a long-lasting problem. What are the causes? This condition may be caused by heart conditions, such as:  High  blood pressure.  Heart failure.  Heart valve disease.  Heart surgery. Other causes include:  Pneumonia.  Obstructive sleep apnea.  Lung cancer.  Thyroid disease.  Drinking too much alcohol. Sometimes the cause is not known. What increases the risk? You are more likely to develop this condition if:  You smoke.  You are older.  You have diabetes.  You are overweight.  You have a family history of this condition.  You exercise often and hard. What are the signs or symptoms? Common symptoms of this condition include:  A feeling like your heart is beating very fast.  Chest pain.  Feeling short of breath.  Feeling light-headed or weak.  Getting tired easily. Follow these instructions at home: Medicines  Take over-the-counter and prescription medicines only as told by your doctor.  If your doctor gives you a blood-thinning medicine, take it exactly as told. Taking too much of it can cause bleeding. Taking too little of it does not protect you against clots. Clots can cause a stroke. Lifestyle      Do not use any tobacco products. These include cigarettes, chewing tobacco, and e-cigarettes. If you need help quitting, ask your doctor.  Do not drink alcohol.  Do not drink beverages that have caffeine. These include coffee, soda, and tea.  Follow diet instructions as told by your doctor.  Exercise regularly as told by your doctor. General instructions  If you have a condition that causes breathing to stop for a short period of time (apnea), treat it as told by your doctor.  Keep a healthy weight. Do not use diet pills unless your doctor says they are safe for you. Diet pills may make heart problems worse.  Keep all follow-up visits as told by your doctor. This is important. Contact a doctor if:  You notice a change in the speed, rhythm, or strength of your heartbeat.  You are taking a blood-thinning medicine and you see more bruising.  You get tired  more easily when you move or exercise.  You have a sudden change in weight. Get help right away if:   You have pain in your chest or your belly (abdomen).  You have trouble breathing.  You have blood in your vomit, poop, or pee (urine).  You have any signs of a stroke. "BE FAST" is an easy way to remember the main warning signs: ? B - Balance. Signs are dizziness, sudden trouble walking, or loss of balance. ? E - Eyes. Signs are trouble seeing or a change in how you see. ? F - Face. Signs are sudden weakness or loss of feeling in the face, or the face or eyelid drooping on one side. ? A - Arms. Signs are weakness or loss of feeling in an arm. This happens suddenly and usually on one side of the body. ? S - Speech. Signs are sudden trouble speaking, slurred speech, or trouble understanding what people say. ? T - Time. Time to call emergency services. Write down what time symptoms started.  You have other signs of a stroke, such as: ? A sudden, very bad headache with no known cause. ? Feeling sick to your stomach (nausea). ? Throwing up (vomiting). ? Jerky movements you cannot control (seizure). These symptoms may be an emergency. Do not wait to see if the symptoms will go away. Get medical help right away. Call your local emergency services (911 in the [place] Do not drive yourself to the hospital. Summary  Atrial fibrillation is a type of heartbeat that is irregular or fast (rapid).  You are at higher risk of this condition if you smoke, are older, have diabetes, or are overweight.  Follow your doctor's instructions about medicines, diet, exercise, and follow-up visits.  Get help right away if you think that you have signs of a stroke. This information is not intended to replace advice given to you by your health care provider. Make sure you discuss any questions you have with your health care provider. Document Released: 02/13/2008 Document Revised: 07/10/2017 Document Reviewed:  06/27/2017 Elsevier Patient Education  2020 [place] on my medicine - ELIQUIS (apixaban)  This medication education was reviewed with me or my healthcare representative as part of my discharge preparation.  The pharmacist that spoke with me during my hospital stay was:  Georgina Peer, [facility]  Why was Eliquis prescribed for you? Eliquis was prescribed for you to reduce the risk of a blood clot forming that can cause a stroke if you have a medical condition called atrial fibrillation (a type of irregular heartbeat).  What do You need to know about Eliquis ? Take your Eliquis TWICE DAILY - one tablet in the morning and one tablet in the evening with or without food. If you have difficulty swallowing the tablet whole please discuss with your pharmacist how to take the medication safely.  Take Eliquis exactly as prescribed by your  doctor and DO NOT stop taking Eliquis without talking to the doctor who prescribed the medication.  Stopping may increase your risk of developing a stroke.  Refill your prescription before you run out.  After discharge, you should have regular check-up appointments with your healthcare provider that is prescribing your Eliquis.  In the future your dose may need to be changed if your kidney function or weight changes by a significant amount or as you get older.  What do you do if you miss a dose? If you miss a dose, take it as soon as you remember on the same day and resume taking twice daily.  Do not take more than one dose of ELIQUIS at the same time to make up a missed dose.  Important Safety Information A possible side effect of Eliquis is bleeding. You should call your healthcare provider right away if you experience any of the following: ? Bleeding from an injury or your nose that does not stop. ? Unusual colored urine (red or dark brown) or unusual colored stools (red or black). ? Unusual bruising for unknown reasons. ? A serious fall  or if you hit your head (even if there is no bleeding).  Some medicines may interact with Eliquis and might increase your risk of bleeding or clotting while on Eliquis. To help avoid this, consult your healthcare provider or pharmacist prior to using any new prescription or non-prescription medications, including herbals, vitamins, non-steroidal anti-inflammatory drugs (NSAIDs) and supplements.  This website has more information on Eliquis (apixaban): http://www.eliquis.com/eliquis/home

## 2019-03-26 NOTE — H&P (View-Only) (Signed)
Cardiology Progress Note  Patient ID: Parker Calderon MRN: [id number] DOB: 1960-09-20 Date of Encounter: 03/26/2019  Primary Cardiologist: Evalina Field, MD  Subjective  Good urine output yesterday.  Left heart cath with normal coronaries.  Cardiac output/index are good.  Plan for TEE cardioversion today.  ROS:  All other ROS reviewed and negative. Pertinent positives noted in the HPI.     Inpatient Medications  Scheduled Meds: . amiodarone  400 mg Oral BID   Followed by  . [START ON 03/31/2019] amiodarone  200 mg Oral Daily  . atorvastatin  40 mg Oral q1800  . Chlorhexidine Gluconate Cloth  6 each Topical Daily  . digoxin  0.125 mg Oral Daily  . mupirocin ointment  1 application Nasal BID  . potassium chloride  40 mEq Oral Once  . sacubitril-valsartan  1 tablet Oral BID  . sodium chloride flush  3 mL Intravenous Q12H  . sodium chloride flush  3 mL Intravenous Q12H  . spironolactone  25 mg Oral Daily   Continuous Infusions: . sodium chloride    . furosemide    . heparin 1,500 Units/hr (03/26/19 0706)   PRN Meds: sodium chloride, acetaminophen, sodium chloride flush   Vital Signs   Vitals:   03/25/19 1900 03/25/19 2320 03/26/19 0323 03/26/19 0405  BP:  102/71 115/80   Pulse:   (!) 114   Resp:   20   Temp: 98.6 F (37 C) 98.3 F (36.8 C) 98.5 F (36.9 C)   TempSrc: Oral Oral Oral   SpO2: 96%  96%   Weight:    (!) 156.4 kg  Height:        Intake/Output Summary (Last 24 hours) at 03/26/2019 0751 Last data filed at 03/26/2019 0403 Gross per 24 hour  Intake 480 ml  Output 3265 ml  Net -2785 ml   Last 3 Weights 03/26/2019 03/25/2019 03/25/2019  Weight (lbs) 344 lb 12.8 oz 348 lb 1.7 oz 345 lb 10.9 oz  Weight (kg) 156.4 kg 157.9 kg 156.8 kg      Telemetry  Overnight telemetry shows atrial fibrillation with heart rate in the 90-110 range, which I personally reviewed.   ECG  The most recent ECG shows atrial fibrillation with RVR, which I personally reviewed.    Physical Exam   Vitals:   03/25/19 1900 03/25/19 2320 03/26/19 0323 03/26/19 0405  BP:  102/71 115/80   Pulse:   (!) 114   Resp:   20   Temp: 98.6 F (37 C) 98.3 F (36.8 C) 98.5 F (36.9 C)   TempSrc: Oral Oral Oral   SpO2: 96%  96%   Weight:    (!) 156.4 kg  Height:         Intake/Output Summary (Last 24 hours) at 03/26/2019 0751 Last data filed at 03/26/2019 0403 Gross per 24 hour  Intake 480 ml  Output 3265 ml  Net -2785 ml    Last 3 Weights 03/26/2019 03/25/2019 03/25/2019  Weight (lbs) 344 lb 12.8 oz 348 lb 1.7 oz 345 lb 10.9 oz  Weight (kg) 156.4 kg 157.9 kg 156.8 kg    Body mass index is 48.09 kg/m.  General: Morbidly obese male, no acute distress Head: Atraumatic, normal size  Eyes: PEERLA, EOMI  Neck: Supple, JVD difficult to assess due to neck adiposity Endocrine: No thryomegaly Cardiac: Irregular rhythm, no murmurs rubs or gallops Lungs: Faint crackles at the lung bases Abd: Soft, nontender, no hepatomegaly  Ext: 1+ lower extremity edema Musculoskeletal: No  deformities, BUE and BLE strength normal and equal Skin: Warm and dry, no rashes   Neuro: Alert and oriented to person, place, time, and situation, CNII-XII grossly intact, no focal deficits  Psych: Normal mood and affect   Labs  High Sensitivity Troponin:   Recent Labs  Lab 03/21/19 2205 03/22/19 0334  TROPONINIHS 28* 25*     Cardiac EnzymesNo results for input(s): TROPONINI in the last 168 hours. No results for input(s): TROPIPOC in the last 168 hours.  Chemistry Recent Labs  Lab 03/23/19 0032  03/24/19 0529  03/25/19 0659  03/25/19 1218 03/25/19 1651 03/26/19 0151  NA 141   < > 140   < > 141   < > 141 139 140  K 3.7   < > 3.5   < > 3.4*   < > 3.6 3.6 3.8  CL 103   < > 102   < > 100  --   --  100 100  CO2 28   < > 29   < > 30  --   --  29 30  GLUCOSE 109*   < > 108*   < > 105*  --   --  143* 108*  BUN 15   < > 14   < > 14  --   --  14 15  CREATININE 1.07   < > 0.99   < > 1.29*  --   --   1.29* 1.28*  CALCIUM 8.8*   < > 8.7*   < > 8.9  --   --  9.0 8.8*  PROT  --   --  6.1*  --   --   --   --   --   --   ALBUMIN 3.3*  --  3.3*  --   --   --   --   --   --   AST  --   --  21  --   --   --   --   --   --   ALT  --   --  15  --   --   --   --   --   --   ALKPHOS  --   --  49  --   --   --   --   --   --   BILITOT  --   --  1.2  --   --   --   --   --   --   GFRNONAA >60   < > >60   < > >60  --   --  >60 >60  GFRAA >60   < > >60   < > >60  --   --  >60 >60  ANIONGAP 10   < > 9   < > 11  --   --  10 10   < > = values in this interval not displayed.    Hematology Recent Labs  Lab 03/24/19 0529 03/25/19 0659 03/25/19 1215 03/25/19 1218 03/26/19 0151  WBC 11.9* 10.9*  --   --  10.7*  RBC 4.34 4.42  --   --  4.44  HGB 13.7 13.8 14.6 14.3 13.9  HCT 43.2 42.0 43.0 42.0 42.9  MCV 99.5 95.0  --   --  96.6  MCH 31.6 31.2  --   --  31.3  MCHC 31.7 32.9  --   --  32.4  RDW 14.7 14.6  --   --  14.6  PLT 265 290  --   --  249   BNP Recent Labs  Lab 03/21/19 2245  BNP 261.6*    DDimer No results for input(s): DDIMER in the last 168 hours.   Radiology  No results found.  Cardiac Studies  LHC/RHC 123456  LV end diastolic pressure is moderately elevated.  Hemodynamic findings consistent with mild pulmonary hypertension.   1. Normal coronary anatomy 2. Moderately elevated LV filling pressures. PCWP and LVEDP of 23 mm Hg 3. Mild pulmonary venous HTN 4. Preserved cardiac output. Index 3.22.  TTE 03/22/2019 1. Left ventricular ejection fraction, by visual estimation, is <20%. The left ventricle has severely decreased function. There is no left ventricular hypertrophy.  2. Definity contrast agent was given IV to delineate the left ventricular endocardial borders.  3. Moderately dilated left ventricular internal cavity size.  4. The left ventricle demonstrates global hypokinesis.  5. Left ventricular diastolic parameters are indeterminate.  6. LV thrombus excluded by  echo contrast.  7. Global right ventricle has severely reduced systolic function.The right ventricular size is not well visualized. Right vetricular wall thickness was not assessed.  8. Left atrial size was normal.  9. Right atrial size was normal. 10. Mild to moderate aortic valve annular calcification. 11. The mitral valve is normal in structure. Mild to moderate mitral valve regurgitation. 12. The tricuspid valve is normal in structure. Tricuspid valve regurgitation mild-moderate. 13. The aortic valve is tricuspid. Aortic valve regurgitation is not visualized. No evidence of aortic valve sclerosis or stenosis. 14. The pulmonic valve was not well visualized. Pulmonic valve regurgitation is not visualized. 15. Mildly elevated pulmonary artery systolic pressure. 16. The inferior vena cava is dilated in size with <50% respiratory variability, suggesting right atrial pressure of 15 mmHg.  Patient Profile  Parker Calderon a 58 y.o.malewith morbid obesity who was admitted on 11/2 for newly diagnosed atrial fibrillation with RVR, newly diagnosed systolic heart failure ejection fraction around 15%, volume overload.  Assessment & Plan  1.Acute decompensated systolic heart failure, ejection fraction 15%, unclear etiology at this time -Ejection fraction around 15%, with unclear etiology.  TSH was normal, no significant alcohol or drug abuse.  Iron studies normal.  Likely he has morbid obesity and untreated sleep apnea that led to his systolic heart failure.  Atrial fibrillation could be at play here to.  Left heart cath with normal coronary arteries and right heart cath with preserved cardiac output and index.  He was noted to have a wedge of 23 mmHg, and was given 160 mg of IV Lasix last night with good urine output. -N.p.o. for TEE cardioversion today  -I will repeat 160 mg of IV Lasix today.  I think after last night's dose and today's dose he will be euvolemic.  We will plan to start oral Lasix  tomorrow. -We will stop BiDil and transition to Entresto 24/26 mg twice daily and titrate up.  We can plan to titrate him up as an outpatient as I think he will be able to go home tomorrow. -I will plan to stop digoxin after his cardioversion today -I have transitioned him to a oral load of amiodarone as he is received several days of IV.  We will pursue 400 mg twice daily for 5 days and then 200 mg daily for 21 days for complete 1 month load of amiodarone and then stop.  This will not be a long-term medication for him. -I will continue the IV heparin until he has  been cardioverted.  We will plan to stop that and start Eliquis tonight. -We will also transition him to metoprolol succinate after his cardioversion. -Continue Aldactone 25 mg daily  2.New onset atrial fibrillation with rapid ventricular response -Unclear if this actually caused his cardiomyopathy or if the cardiomyopathy led to his atrial fibrillation -Continue heparin drip for now.  We will transition to Eliquis tonight. -We will also plan to stop his digoxin after he receives his dose today. -TEE cardioversion today  3.  Hypokalemia -Related to aggressive diuresis continue to replace  4.  Acute kidney injury -Secondary to aggressive diuresis and volume overload -We will continue to monitor with daily BMP  5.  Hypertension -Controlled on current heart failure medication  6.  Hyperlipidemia -Continue statin  7.  Morbid obesity with likely undiagnosed obstructive sleep apnea and obesity hypoventilation syndrome -We will plan to pursue an outpatient sleep study  FEN - no IVF - npo - dvt ppx: heparin drip - code: full   Disposition: I anticipate discharge tomorrow.  After his TEE cardioversion today we will stop his digoxin and transition him to metoprolol succinate 100 mg daily.  I have transitioned him to [place] today.  He will continue Aldactone 25 mg daily.  We will transition him to p.o. Lasix tomorrow and  discharge him tomorrow.  I will arrange follow-up for him to see our APP in 1 week and then he will see me in 2 to 4 weeks in clinic.  He will need an outpatient sleep study.    For questions or updates, please contact [place] Please consult [email or web address] for contact info under   Signed, [place] T. Audie Box, [place]  03/26/2019 7:51 AM

## 2019-03-26 NOTE — Transfer of Care (Signed)
Immediate Anesthesia Transfer of Care Note  Patient: Parker Calderon  Procedure(s) Performed: TRANSESOPHAGEAL ECHOCARDIOGRAM (TEE) (N/A ) CARDIOVERSION (N/A )  Patient Location: Endoscopy Unit  Anesthesia Type:MAC  Level of Consciousness: awake, alert  and oriented  Airway & Oxygen Therapy: Patient Spontanous Breathing  Post-op Assessment: Report given to RN and Post -op Vital signs reviewed and stable  Post vital signs: Reviewed and stable  Last Vitals:  Vitals Value Taken Time  BP 98/43   Temp    Pulse 92 03/26/19 1432  Resp 14 03/26/19 1432  SpO2 94 % 03/26/19 1432  Vitals shown include unvalidated device data.  Last Pain:  Vitals:   03/26/19 1312  TempSrc: Oral  PainSc: 0-No pain         Complications: No apparent anesthesia complications

## 2019-03-26 NOTE — Progress Notes (Signed)
Cardiology Progress Note  Patient ID: Parker Calderon MRN: [id number] DOB: 06-01-1960 Date of Encounter: 03/26/2019  Primary Cardiologist: Evalina Field, MD  Subjective  Good urine output yesterday.  Left heart cath with normal coronaries.  Cardiac output/index are good.  Plan for TEE cardioversion today.  ROS:  All other ROS reviewed and negative. Pertinent positives noted in the HPI.     Inpatient Medications  Scheduled Meds: . amiodarone  400 mg Oral BID   Followed by  . [START ON 03/31/2019] amiodarone  200 mg Oral Daily  . atorvastatin  40 mg Oral q1800  . Chlorhexidine Gluconate Cloth  6 each Topical Daily  . digoxin  0.125 mg Oral Daily  . mupirocin ointment  1 application Nasal BID  . potassium chloride  40 mEq Oral Once  . sacubitril-valsartan  1 tablet Oral BID  . sodium chloride flush  3 mL Intravenous Q12H  . sodium chloride flush  3 mL Intravenous Q12H  . spironolactone  25 mg Oral Daily   Continuous Infusions: . sodium chloride    . furosemide    . heparin 1,500 Units/hr (03/26/19 0706)   PRN Meds: sodium chloride, acetaminophen, sodium chloride flush   Vital Signs   Vitals:   03/25/19 1900 03/25/19 2320 03/26/19 0323 03/26/19 0405  BP:  102/71 115/80   Pulse:   (!) 114   Resp:   20   Temp: 98.6 F (37 C) 98.3 F (36.8 C) 98.5 F (36.9 C)   TempSrc: Oral Oral Oral   SpO2: 96%  96%   Weight:    (!) 156.4 kg  Height:        Intake/Output Summary (Last 24 hours) at 03/26/2019 0751 Last data filed at 03/26/2019 0403 Gross per 24 hour  Intake 480 ml  Output 3265 ml  Net -2785 ml   Last 3 Weights 03/26/2019 03/25/2019 03/25/2019  Weight (lbs) 344 lb 12.8 oz 348 lb 1.7 oz 345 lb 10.9 oz  Weight (kg) 156.4 kg 157.9 kg 156.8 kg      Telemetry  Overnight telemetry shows atrial fibrillation with heart rate in the 90-110 range, which I personally reviewed.   ECG  The most recent ECG shows atrial fibrillation with RVR, which I personally reviewed.    Physical Exam   Vitals:   03/25/19 1900 03/25/19 2320 03/26/19 0323 03/26/19 0405  BP:  102/71 115/80   Pulse:   (!) 114   Resp:   20   Temp: 98.6 F (37 C) 98.3 F (36.8 C) 98.5 F (36.9 C)   TempSrc: Oral Oral Oral   SpO2: 96%  96%   Weight:    (!) 156.4 kg  Height:         Intake/Output Summary (Last 24 hours) at 03/26/2019 0751 Last data filed at 03/26/2019 0403 Gross per 24 hour  Intake 480 ml  Output 3265 ml  Net -2785 ml    Last 3 Weights 03/26/2019 03/25/2019 03/25/2019  Weight (lbs) 344 lb 12.8 oz 348 lb 1.7 oz 345 lb 10.9 oz  Weight (kg) 156.4 kg 157.9 kg 156.8 kg    Body mass index is 48.09 kg/m.  General: Morbidly obese male, no acute distress Head: Atraumatic, normal size  Eyes: PEERLA, EOMI  Neck: Supple, JVD difficult to assess due to neck adiposity Endocrine: No thryomegaly Cardiac: Irregular rhythm, no murmurs rubs or gallops Lungs: Faint crackles at the lung bases Abd: Soft, nontender, no hepatomegaly  Ext: 1+ lower extremity edema Musculoskeletal: No  deformities, BUE and BLE strength normal and equal Skin: Warm and dry, no rashes   Neuro: Alert and oriented to person, place, time, and situation, CNII-XII grossly intact, no focal deficits  Psych: Normal mood and affect   Labs  High Sensitivity Troponin:   Recent Labs  Lab 03/21/19 2205 03/22/19 0334  TROPONINIHS 28* 25*     Cardiac EnzymesNo results for input(s): TROPONINI in the last 168 hours. No results for input(s): TROPIPOC in the last 168 hours.  Chemistry Recent Labs  Lab 03/23/19 0032  03/24/19 0529  03/25/19 0659  03/25/19 1218 03/25/19 1651 03/26/19 0151  NA 141   < > 140   < > 141   < > 141 139 140  K 3.7   < > 3.5   < > 3.4*   < > 3.6 3.6 3.8  CL 103   < > 102   < > 100  --   --  100 100  CO2 28   < > 29   < > 30  --   --  29 30  GLUCOSE 109*   < > 108*   < > 105*  --   --  143* 108*  BUN 15   < > 14   < > 14  --   --  14 15  CREATININE 1.07   < > 0.99   < > 1.29*  --   --   1.29* 1.28*  CALCIUM 8.8*   < > 8.7*   < > 8.9  --   --  9.0 8.8*  PROT  --   --  6.1*  --   --   --   --   --   --   ALBUMIN 3.3*  --  3.3*  --   --   --   --   --   --   AST  --   --  21  --   --   --   --   --   --   ALT  --   --  15  --   --   --   --   --   --   ALKPHOS  --   --  49  --   --   --   --   --   --   BILITOT  --   --  1.2  --   --   --   --   --   --   GFRNONAA >60   < > >60   < > >60  --   --  >60 >60  GFRAA >60   < > >60   < > >60  --   --  >60 >60  ANIONGAP 10   < > 9   < > 11  --   --  10 10   < > = values in this interval not displayed.    Hematology Recent Labs  Lab 03/24/19 0529 03/25/19 0659 03/25/19 1215 03/25/19 1218 03/26/19 0151  WBC 11.9* 10.9*  --   --  10.7*  RBC 4.34 4.42  --   --  4.44  HGB 13.7 13.8 14.6 14.3 13.9  HCT 43.2 42.0 43.0 42.0 42.9  MCV 99.5 95.0  --   --  96.6  MCH 31.6 31.2  --   --  31.3  MCHC 31.7 32.9  --   --  32.4  RDW 14.7 14.6  --   --  14.6  PLT 265 290  --   --  249   BNP Recent Labs  Lab 03/21/19 2245  BNP 261.6*    DDimer No results for input(s): DDIMER in the last 168 hours.   Radiology  No results found.  Cardiac Studies  LHC/RHC 123456  LV end diastolic pressure is moderately elevated.  Hemodynamic findings consistent with mild pulmonary hypertension.   1. Normal coronary anatomy 2. Moderately elevated LV filling pressures. PCWP and LVEDP of 23 mm Hg 3. Mild pulmonary venous HTN 4. Preserved cardiac output. Index 3.22.  TTE 03/22/2019 1. Left ventricular ejection fraction, by visual estimation, is <20%. The left ventricle has severely decreased function. There is no left ventricular hypertrophy.  2. Definity contrast agent was given IV to delineate the left ventricular endocardial borders.  3. Moderately dilated left ventricular internal cavity size.  4. The left ventricle demonstrates global hypokinesis.  5. Left ventricular diastolic parameters are indeterminate.  6. LV thrombus excluded by  echo contrast.  7. Global right ventricle has severely reduced systolic function.The right ventricular size is not well visualized. Right vetricular wall thickness was not assessed.  8. Left atrial size was normal.  9. Right atrial size was normal. 10. Mild to moderate aortic valve annular calcification. 11. The mitral valve is normal in structure. Mild to moderate mitral valve regurgitation. 12. The tricuspid valve is normal in structure. Tricuspid valve regurgitation mild-moderate. 13. The aortic valve is tricuspid. Aortic valve regurgitation is not visualized. No evidence of aortic valve sclerosis or stenosis. 14. The pulmonic valve was not well visualized. Pulmonic valve regurgitation is not visualized. 15. Mildly elevated pulmonary artery systolic pressure. 16. The inferior vena cava is dilated in size with <50% respiratory variability, suggesting right atrial pressure of 15 mmHg.  Patient Profile  Drayven Grill Crouchis a 58 y.o.malewith morbid obesity who was admitted on 11/2 for newly diagnosed atrial fibrillation with RVR, newly diagnosed systolic heart failure ejection fraction around 15%, volume overload.  Assessment & Plan  1.Acute decompensated systolic heart failure, ejection fraction 15%, unclear etiology at this time -Ejection fraction around 15%, with unclear etiology.  TSH was normal, no significant alcohol or drug abuse.  Iron studies normal.  Likely he has morbid obesity and untreated sleep apnea that led to his systolic heart failure.  Atrial fibrillation could be at play here to.  Left heart cath with normal coronary arteries and right heart cath with preserved cardiac output and index.  He was noted to have a wedge of 23 mmHg, and was given 160 mg of IV Lasix last night with good urine output. -N.p.o. for TEE cardioversion today  -I will repeat 160 mg of IV Lasix today.  I think after last night's dose and today's dose he will be euvolemic.  We will plan to start oral Lasix  tomorrow. -We will stop BiDil and transition to Entresto 24/26 mg twice daily and titrate up.  We can plan to titrate him up as an outpatient as I think he will be able to go home tomorrow. -I will plan to stop digoxin after his cardioversion today -I have transitioned him to a oral load of amiodarone as he is received several days of IV.  We will pursue 400 mg twice daily for 5 days and then 200 mg daily for 21 days for complete 1 month load of amiodarone and then stop.  This will not be a long-term medication for him. -I will continue the IV heparin until he has  been cardioverted.  We will plan to stop that and start Eliquis tonight. -We will also transition him to metoprolol succinate after his cardioversion. -Continue Aldactone 25 mg daily  2.New onset atrial fibrillation with rapid ventricular response -Unclear if this actually caused his cardiomyopathy or if the cardiomyopathy led to his atrial fibrillation -Continue heparin drip for now.  We will transition to Eliquis tonight. -We will also plan to stop his digoxin after he receives his dose today. -TEE cardioversion today  3.  Hypokalemia -Related to aggressive diuresis continue to replace  4.  Acute kidney injury -Secondary to aggressive diuresis and volume overload -We will continue to monitor with daily BMP  5.  Hypertension -Controlled on current heart failure medication  6.  Hyperlipidemia -Continue statin  7.  Morbid obesity with likely undiagnosed obstructive sleep apnea and obesity hypoventilation syndrome -We will plan to pursue an outpatient sleep study  FEN - no IVF - npo - dvt ppx: heparin drip - code: full   Disposition: I anticipate discharge tomorrow.  After his TEE cardioversion today we will stop his digoxin and transition him to metoprolol succinate 100 mg daily.  I have transitioned him to [place] today.  He will continue Aldactone 25 mg daily.  We will transition him to p.o. Lasix tomorrow and  discharge him tomorrow.  I will arrange follow-up for him to see our APP in 1 week and then he will see me in 2 to 4 weeks in clinic.  He will need an outpatient sleep study.    For questions or updates, please contact [place] Please consult [email or web address] for contact info under   Signed, [place] T. Audie Box, [place]  03/26/2019 7:51 AM

## 2019-03-26 NOTE — Telephone Encounter (Signed)
Patient currently admitted. TOC call needed.

## 2019-03-26 NOTE — TOC Benefit Eligibility Note (Signed)
Transition of Care [facility]) Benefit Eligibility Note    Patient Details  Name: Parker Calderon MRN: [id number] Date of Birth: 1961/04/11   Medication/Dose: eliquis  Covered?: Yes  Tier: Other(Unavailable)  Prescription Coverage Preferred Pharmacy: any major retail [place] with Person/Company/Phone Number:: [facility]  Co-Pay: $43.85 for 30 day retail  Prior Approval: No  Deductible: (Unavailable)  Additional Notes: na    Delorse Lek Phone Number: 03/26/2019, 1:12 PM

## 2019-03-26 NOTE — CV Procedure (Signed)
TEE/DCC: Anesthesia : Propofol Received 10 mg Loading dose Eliquis this am And had been on heparin per Dr Gus Height  LVE EF 25-30% diffuse hypokinesis Mild MR Mild LAE No ASD/PFO No LAA thrombus Normal AV No effusion  DCC x 3 all at 200 J biphasic Converted from afib rate 124 to NSR rate 92 bpm  No immediate neurologic sequelae  Jenkins Rouge

## 2019-03-27 LAB — CBC
HCT: 45.1 % (ref 39.0–52.0)
Hemoglobin: 14.8 g/dL (ref 13.0–17.0)
MCH: 31.6 pg (ref 26.0–34.0)
MCHC: 32.8 g/dL (ref 30.0–36.0)
MCV: 96.2 fL (ref 80.0–100.0)
Platelets: 276 10*3/uL (ref 150–400)
RBC: 4.69 MIL/uL (ref 4.22–5.81)
RDW: 14.4 % (ref 11.5–15.5)
WBC: 11.1 10*3/uL — ABNORMAL HIGH (ref 4.0–10.5)
nRBC: 0 % (ref 0.0–0.2)

## 2019-03-27 LAB — BASIC METABOLIC PANEL
Anion gap: 11 (ref 5–15)
BUN: 14 mg/dL (ref 6–20)
CO2: 27 mmol/L (ref 22–32)
Calcium: 8.8 mg/dL — ABNORMAL LOW (ref 8.9–10.3)
Chloride: 101 mmol/L (ref 98–111)
Creatinine, Ser: 1.16 mg/dL (ref 0.61–1.24)
GFR calc Af Amer: >60 mL/min (ref >60)
GFR calc non Af Amer: >60 mL/min (ref >60)
Glucose, Bld: 95 mg/dL (ref 70–99)
Potassium: 4 mmol/L (ref 3.5–5.1)
Sodium: 139 mmol/L (ref 135–145)

## 2019-03-27 LAB — MAGNESIUM: Magnesium: 2 mg/dL (ref 1.7–2.4)

## 2019-03-27 MED ORDER — SPIRONOLACTONE 25 MG PO TABS: 25.0000 mg | ORAL_TABLET | Freq: Every day | ORAL | 3 refills | Status: DC

## 2019-03-27 MED ORDER — SACUBITRIL-VALSARTAN 24-26 MG PO TABS: 1.0000 | ORAL_TABLET | Freq: Two times a day (BID) | ORAL | 5 refills | Status: DC

## 2019-03-27 MED ORDER — METOPROLOL SUCCINATE ER 100 MG PO TB24: 100.0000 mg | ORAL_TABLET | Freq: Every day | ORAL | Status: DC

## 2019-03-27 MED ORDER — ATORVASTATIN CALCIUM 40 MG PO TABS: 40.0000 mg | ORAL_TABLET | Freq: Every day | ORAL | 3 refills | Status: DC

## 2019-03-27 MED ORDER — FUROSEMIDE 80 MG PO TABS: 80.0000 mg | ORAL_TABLET | Freq: Every day | ORAL | Status: DC

## 2019-03-27 MED ORDER — AMIODARONE HCL 200 MG PO TABS: ORAL_TABLET | ORAL | 0 refills | Status: DC

## 2019-03-27 MED ORDER — FUROSEMIDE 80 MG PO TABS: 80.0000 mg | ORAL_TABLET | Freq: Every day | ORAL | 3 refills | Status: DC

## 2019-03-27 MED ORDER — METOPROLOL SUCCINATE ER 100 MG PO TB24: 100.0000 mg | ORAL_TABLET | Freq: Every day | ORAL | 3 refills | Status: DC

## 2019-03-27 MED ORDER — APIXABAN 5 MG PO TABS: 5.0000 mg | ORAL_TABLET | Freq: Two times a day (BID) | ORAL | 3 refills | Status: DC

## 2019-03-27 NOTE — Progress Notes (Signed)
Pt's wife came and explained to her regarding pt's Lt. Arm hardness. She understood it very well. Pt took his all belongings. HS [employer]

## 2019-03-27 NOTE — Progress Notes (Signed)
Removed PIV access x 2. Patient received discharge instructions and eliquis & entresto cards by care manager. Pt's wife will come to pick him up around 3 pm. Pt had questions about Lt. Arm PIV site hardness. Charge nurse explained patient regarding this and will explained pt's wife later. HS [employer]

## 2019-03-27 NOTE — Care Management (Signed)
Patient provided with 30 day and copay assistance cards for both Eliquis and Entresto. He verbalized understanding for use. No other CM needs.

## 2019-03-27 NOTE — Discharge Summary (Signed)
Discharge Summary    Patient ID: Parker Calderon,  MRN: [id number], DOB/AGE: 1960/09/10 58 y.o.  Admit date: 03/22/2019 Discharge date: 03/27/2019  Primary Care Provider: Shirline Frees Primary Cardiologist: Evalina Field, MD   Discharge Diagnoses    Principal Problem:   Atrial fibrillation with rapid ventricular response ([place]) Active Problems:   Volume overload   Elevated blood pressure reading   HLD (hyperlipidemia)   Prolonged QT interval   Acute systolic CHF (congestive heart failure) ([place])    History of Present Illness     Parker Calderon is a 58 y.o. male with past medical history of recently diagnosed atrial fibrillation, HLD, morbid obesity and arthritis who presented to [facility] ED on 03/22/2019 for evaluation of worsening dyspnea.  He had recently been evaluated by his PCP and was started on Cardizem and Xarelto after being told he had an "abnormal EKG". He had not started the medications until the day prior to admission but developed worsening dyspnea and abdominal bloating which prompted him to come to the ED.  Initial EKG showed atrial fibrillation with RVR, heart rate 141. He was found to be hypokalemic with K+ at 3.0 and BNP was elevated to 61. Troponin values were flat at 28 and 25. He was negative for COVID-19. He was started on IV Cardizem along with IV Heparin with anticipation of a TEE guided cardioversion during his admission.    Hospital Course     Consultants: None   On 03/22/2019, his echocardiogram showed a significantly reduced EF of less than 20%. IV Cardizem was discontinued and he was started on IV Amiodarone.  Was also started on Aldactone 25 mg daily and BiDil 20-37.5mg  2 tablets TID while continuing on IV Lasix 100mg  BID. Given his atrial fibrillation and decompensated heart failure, he was also started on Digoxin.  Given his significantly reduced EF, a cardiac catheterization was performed by Dr. Martinique on 03/25/2019 which showed normal  coronary anatomy and preserved cardiac output with index of 3.22. Was noted to have moderately elevated LV filling pressures with LVEDP of 23 mmHg. Also had mild pulmonary hypertension.  The day following his cardiac catheterization, a TEE cardioversion was pursued. TEE estimated his EF at 25 to 30% and the right ventricle had normal systolic function. There was no evidence of an LAA thrombus. He underwent DCCV x 3 at 200J biphasic shock each with eventual conversion to NSR. He was transitioned from IV Amiodarone to PO Amiodarone 400 mg twice daily for 5 days (to stop on 03/31/2019) then 200 mg daily for 21 days then can hopefully stop at that time. BiDil was discontinued and he was transitioned to Entresto 24-26 mg twice daily which could be further titrated as an outpatient. Digoxin was also discontinued and he was started on Toprol-XL 100mg  daily and continued on Spironolactone 25mg  daily.   He had been receiving IV Lasix 120mg  BID and was transitioned to 80mg  daily at the time of discharge following discussion with Dr. Rayann Heman. Weight was recorded as 333 lbs at discharge with creatinine at 1.16 and K+ 4.0. Had a recorded net output of -16.1L this admission. He has a follow-up appointment with Dr. Audie Box on 03/31/2019 and will need a BMET at that time to reassess renal function and electrolytes.      _____________  Discharge Vitals Blood pressure 139/86, pulse 84, temperature 98.1 F (36.7 C), temperature source Oral, resp. rate 20, height 5\' 11"  (1.803 m), weight (!) 151.3 kg, SpO2 96 %.  Filed Weights   03/26/19 0405 03/26/19 1312 03/27/19 0346  Weight: (!) 156.4 kg (!) 156.4 kg (!) 151.3 kg    Labs & Radiologic Studies     CBC Recent Labs    03/26/19 0151 03/27/19 0213  WBC 10.7* 11.1*  HGB 13.9 14.8  HCT 42.9 45.1  MCV 96.6 96.2  PLT 249 [id number]   Basic Metabolic Panel Recent Labs    03/26/19 0151 03/27/19 0213  NA 140 139  K 3.8 4.0  CL 100 101  CO2 30 27  GLUCOSE 108* 95   BUN 15 14  CREATININE 1.28* 1.16  CALCIUM 8.8* 8.8*  MG 1.9 2.0   Liver Function Tests No results for input(s): AST, ALT, ALKPHOS, BILITOT, PROT, ALBUMIN in the last 72 hours. No results for input(s): LIPASE, AMYLASE in the last 72 hours. Cardiac Enzymes No results for input(s): CKTOTAL, CKMB, CKMBINDEX, TROPONINI in the last 72 hours. BNP Invalid input(s): POCBNP D-Dimer No results for input(s): DDIMER in the last 72 hours. Hemoglobin A1C No results for input(s): HGBA1C in the last 72 hours. Fasting Lipid Panel No results for input(s): CHOL, HDL, LDLCALC, TRIG, CHOLHDL, LDLDIRECT in the last 72 hours. Thyroid Function Tests No results for input(s): TSH, T4TOTAL, T3FREE, THYROIDAB in the last 72 hours.  Invalid input(s): FREET3  Dg Chest 2 View  Result Date: 03/21/2019 CLINICAL DATA:  Feet swelling EXAM: CHEST - 2 VIEW COMPARISON:  None. FINDINGS: Heart is borderline in size. Mild vascular congestion. No overt edema. No confluent opacities or effusions. No acute bony abnormality. IMPRESSION: Borderline heart size.  Mild vascular congestion. Electronically Signed   By: Rolm Baptise M.D.   On: 03/21/2019 21:56     Diagnostic Studies/Procedures     Cardiac Catheterization: 123456  LV end diastolic pressure is moderately elevated.  Hemodynamic findings consistent with mild pulmonary hypertension.   1. Normal coronary anatomy 2. Moderately elevated LV filling pressures. PCWP and LVEDP of 23 mm Hg 3. Mild pulmonary venous HTN 4. Preserved cardiac output. Index 3.22.  Plan: medical therapy. Will plan to resume IV heparin in 8 hours pending decision for oral anticoagulation by primary team.   TTE: 03/22/2019 IMPRESSIONS    1. Left ventricular ejection fraction, by visual estimation, is <20%. The left ventricle has severely decreased function. There is no left ventricular hypertrophy.  2. Definity contrast agent was given IV to delineate the left ventricular endocardial  borders.  3. Moderately dilated left ventricular internal cavity size.  4. The left ventricle demonstrates global hypokinesis.  5. Left ventricular diastolic parameters are indeterminate.  6. LV thrombus excluded by echo contrast.  7. Global right ventricle has severely reduced systolic function.The right ventricular size is not well visualized. Right vetricular wall thickness was not assessed.  8. Left atrial size was normal.  9. Right atrial size was normal. 10. Mild to moderate aortic valve annular calcification. 11. The mitral valve is normal in structure. Mild to moderate mitral valve regurgitation. 12. The tricuspid valve is normal in structure. Tricuspid valve regurgitation mild-moderate. 13. The aortic valve is tricuspid. Aortic valve regurgitation is not visualized. No evidence of aortic valve sclerosis or stenosis. 14. The pulmonic valve was not well visualized. Pulmonic valve regurgitation is not visualized. 15. Mildly elevated pulmonary artery systolic pressure. 16. The inferior vena cava is dilated in size with <50% respiratory variability, suggesting right atrial pressure of 15 mmHg.   TEE: 03/26/2019 IMPRESSIONS    1. Left ventricular ejection fraction, by visual estimation, is 25  to 30%. The left ventricle has severely decreased function. Moderately increased left ventricular size. There is no left ventricular hypertrophy.  2. Dilated cardiomyopathy.  3. Global right ventricle has normal systolic function.The right ventricular size is normal. No increase in right ventricular wall thickness.  4. Left atrial size was moderately dilated.  5. No LAA thrombus.  6. Right atrial size was moderately dilated.  7. The mitral valve is normal in structure. No evidence of mitral valve regurgitation. Mild mitral stenosis.  8. The tricuspid valve is normal in structure. Tricuspid valve regurgitation is mild.  9. The aortic valve is normal in structure. Aortic valve regurgitation is not  visualized. No evidence of aortic valve sclerosis or stenosis. 10. The pulmonic valve was normal in structure. Pulmonic valve regurgitation is not visualized. 11. The inferior vena cava is normal in size with greater than 50% respiratory variability, suggesting right atrial pressure of 3 mmHg. 12. DCC: x 3 200J biphasic each. Converted form afib rate 122 to NSR rate 91 bpm.   Disposition   Pt is being discharged home today in good condition.  Follow-up Plans & Appointments    Follow-up Information    O'Neal, Cassie Freer, MD Follow up on 03/31/2019.   Specialties: Internal Medicine, Cardiology Why: Please arrive 15 minutes early for your 9:20am post-hospital cardiology follow-up appointment Contact information: [place] [address] [phone]          Discharge Instructions    Amb referral to AFIB Clinic   Complete by: As directed    Diet - low sodium heart healthy   Complete by: As directed    Discharge instructions   Complete by: As directed    [place].  PLEASE ATTEND ALL SCHEDULED FOLLOW-UP APPOINTMENTS.   Activity: Increase activity slowly as tolerated. You may shower, but no soaking baths (or swimming) for 1 week. No driving for 24 hours. No lifting over 5 lbs for 1 week. No sexual activity for 1 week.   You May Return to Work: in 1 week (if applicable)  Wound Care: You may wash cath site gently with soap and water. Keep cath site clean and dry. If you notice pain, swelling, bleeding or pus at your cath site, please call [phone].     Limit daily fluid intake to less than 2 Liters per day. Please limit salt intake.  Please weight yourself every morning. Call cardiology if weight increases by 3 pounds overnight or 5 pounds in a single week.   Increase activity slowly   Complete by: As directed       Discharge Medications     Medication List    STOP  taking these medications   diltiazem 120 MG 24 hr capsule Commonly known as: CARDIZEM CD   rivaroxaban 20 MG Tabs tablet Commonly known as: XARELTO     TAKE these medications   amiodarone 200 MG tablet Commonly known as: PACERONE Take 2 tablets (400mg ) twice daily until 03/31/2019. At that time reduce to 1 tablet (200mg ) once daily. Notes to patient: Can hopefully be discontinued in one month.    apixaban 5 MG Tabs tablet Commonly known as: ELIQUIS Take 1 tablet (5 mg total) by mouth 2 (two) times daily. Notes to patient: Blood thinner to prevent a blood clot or stroke.    atorvastatin 40 MG tablet Commonly known as: LIPITOR Take 1 tablet (40 mg total) by mouth daily at 6  PM. Notes to patient: Cholesterol medication.    furosemide 80 MG tablet Commonly known as: Lasix Take 1 tablet (80 mg total) by mouth daily. Notes to patient: Diuretic.    metoprolol succinate 100 MG 24 hr tablet Commonly known as: TOPROL-XL Take 1 tablet (100 mg total) by mouth daily. Take with or immediately following a meal. Start taking on: March 28, 2019 Notes to patient: To help with the heart rate and pumping function of the heart.    multivitamin tablet Take 1 tablet by mouth daily.   sacubitril-valsartan 24-26 MG Commonly known as: ENTRESTO Take 1 tablet by mouth 2 (two) times daily. Notes to patient: To help with pumping function of the heart.    spironolactone 25 MG tablet Commonly known as: ALDACTONE Take 1 tablet (25 mg total) by mouth daily. Start taking on: March 28, 2019 Notes to patient: To help with pumping function of the heart.          Allergies No Known Allergies   No                               Did the patient have a percutaneous coronary intervention (stent / angioplasty)?:  No.       Outstanding Labs/Studies   BMET at Follow-up Appointment  Duration of Discharge Encounter   Greater than 30 minutes including physician time.  Signed, Erma Heritage, PA-C 03/27/2019, 11:51 AM

## 2019-03-27 NOTE — Plan of Care (Signed)
  Problem: Education: Goal: Knowledge of General Education information will improve Description: Including pain rating scale, medication(s)/side effects and non-pharmacologic comfort measures Outcome: Progressing   Problem: Health Behavior/Discharge Planning: Goal: Ability to manage health-related needs will improve Outcome: Progressing   Problem: Clinical Measurements: Goal: Ability to maintain clinical measurements within normal limits will improve Outcome: Progressing Goal: Will remain free from infection Outcome: Progressing Goal: Diagnostic test results will improve Outcome: Progressing Goal: Respiratory complications will improve Outcome: Progressing Goal: Cardiovascular complication will be avoided Outcome: Progressing   Problem: Activity: Goal: Risk for activity intolerance will decrease Outcome: Progressing   Problem: Nutrition: Goal: Adequate nutrition will be maintained Outcome: Progressing   Problem: Coping: Goal: Level of anxiety will decrease Outcome: Progressing   Problem: Elimination: Goal: Will not experience complications related to bowel motility Outcome: Progressing Goal: Will not experience complications related to urinary retention Outcome: Progressing   Problem: Pain Managment: Goal: General experience of comfort will improve Outcome: Progressing   Problem: Skin Integrity: Goal: Risk for impaired skin integrity will decrease Outcome: Progressing   Problem: Safety: Goal: Ability to remain free from injury will improve Outcome: Progressing   Problem: Education: Goal: Ability to verbalize understanding of medication therapies will improve Outcome: Progressing Goal: Individualized Educational Video(s) Outcome: Progressing   Problem: Activity: Goal: Capacity to carry out activities will improve Outcome: Progressing   Problem: Cardiac: Goal: Ability to achieve and maintain adequate cardiopulmonary perfusion will improve Outcome:  Progressing

## 2019-03-27 NOTE — Progress Notes (Signed)
Doing well s/p cardioversion yesterday  Remains in sinus rhythm. Discharge on oral lasix   As per Dr Marisue Ivan:  -We will stop BiDil and transition to Entresto 24/26 mg twice daily and titrate up.  We can plan to titrate him up as an outpatient as I think he will be able to go home tomorrow. -I will plan to stop digoxin after his cardioversion today -I have transitioned him to a oral load of amiodarone as he is received several days of IV.  We will pursue 400 mg twice daily for 5 days and then 200 mg daily for 21 days for complete 1 month load of amiodarone and then stop.  This will not be a long-term medication for him. -I will continue the IV heparin until he has been cardioverted.  We will plan to stop that and start Eliquis tonight. -We will also transition him to metoprolol succinate after his cardioversion. -Continue Aldactone 25 mg daily  He has close outpatient follow-up already arranged.   DC to home today.  Thompson Grayer MD, [facility] 03/27/2019 10:00 AM

## 2019-03-28 ENCOUNTER — Encounter (HOSPITAL_COMMUNITY): Payer: Self-pay | Admitting: Cardiovascular Disease

## 2019-03-29 NOTE — Telephone Encounter (Signed)
Patient contacted regarding discharge from [facility] on 03/27/19.  Patient understands to follow up with provider DR ONEAL on 03/31/19 at 9:20 AM at [facility]. Patient understands discharge instructions? YES Patient understands medications and regiment? YES Patient understands to bring all medications to this visit? YES

## 2019-03-30 NOTE — Progress Notes (Signed)
Cardiology Office Note:   Date:  03/31/2019  NAME:  Parker Calderon    MRN: [id number] DOB:  1960/12/01   PCP:  Shirline Frees, MD  Cardiologist:  Evalina Field, MD   Referring MD: Shirline Frees, MD   Chief Complaint  Patient presents with  . Congestive Heart Failure   History of Present Illness:   Parker Calderon is a 58 y.o. male with a hx of newly diagnosed systolic HF ([id number], normal LHC), persistent Afib s/p TEE/DCCV, morbidy obesity (BMI 53), HTN who is being seen for hospital follow-up of congestive heart failure. He was admitted 11/2-11/7 for Afib with RVR, volume overload, and new systolic HF. LHC normal. He had 16L of fluid removal with improvement in symptoms. He also was in Afib and underwent TEE/DCCV with return to NSR. Today he presents for hospital follow-up.  He is doing remarkably well.  He left the hospital at 333 pounds and his weight today is 334.  He reports that his lower extremity edema has been controlled with Lasix.  He states he started to exercise and cut his grass other day without limitations.  He reports he has more energy since getting out of the hospital.  He denies any shortness of breath with activity.  He is not returned to work and needs a note today.  Blood pressure slightly elevated for him, but appears to be little nervous.  EKG shows normal sinus rhythm which is reassuring.  He denies any chest pain or chest pressure with activity.  He is having no bleeding issues with Eliquis.  He denies any palpitations or other cardiovascular symptoms.  Past Medical History: Past Medical History:  Diagnosis Date  . Arthritis    Rhinitis  . Atrial fibrillation ([place])   . CHF (congestive heart failure) ([place])   . Hyperlipidemia   . Hypertension   . Irritable bowel syndrome   . Left thigh pain    Lateral, aching  . Obesity, unspecified   . Varicose veins     Past Surgical History: Past Surgical History:  Procedure Laterality Date  . CARDIOVERSION N/A  03/26/2019   Procedure: CARDIOVERSION;  Surgeon: Josue Hector, MD;  Location: [facility] ENDOSCOPY;  Service: Cardiovascular;  Laterality: N/A;  . RIGHT/LEFT HEART CATH AND CORONARY ANGIOGRAPHY N/A 03/25/2019   Procedure: RIGHT/LEFT HEART CATH AND CORONARY ANGIOGRAPHY;  Surgeon: Martinique, Peter M, MD;  Location: [facility] CV LAB;  Service: Cardiovascular;  Laterality: N/A;  . TEE WITHOUT CARDIOVERSION N/A 03/26/2019   Procedure: TRANSESOPHAGEAL ECHOCARDIOGRAM (TEE);  Surgeon: Josue Hector, MD;  Location[facility] ENDOSCOPY;  Service: Cardiovascular;  Laterality: N/A;    Current Medications: Current Meds  Medication Sig  . apixaban (ELIQUIS) 5 MG TABS tablet Take 1 tablet (5 mg total) by mouth 2 (two) times daily.  Marland Kitchen atorvastatin (LIPITOR) 40 MG tablet Take 1 tablet (40 mg total) by mouth daily at 6 PM.  . furosemide (LASIX) 80 MG tablet Take 1 tablet (80 mg total) by mouth daily.  . metoprolol succinate (TOPROL-XL) 100 MG 24 hr tablet Take 1 tablet (100 mg total) by mouth daily. Take with or immediately following a meal.  . Multiple Vitamin (MULTIVITAMIN) tablet Take 1 tablet by mouth daily.  Marland Kitchen spironolactone (ALDACTONE) 25 MG tablet Take 1 tablet (25 mg total) by mouth daily.  . [DISCONTINUED] amiodarone (PACERONE) 200 MG tablet Take 2 tablets (400mg ) twice daily until 03/31/2019. At that time reduce to 1 tablet (200mg ) once daily.  . [DISCONTINUED] sacubitril-valsartan (ENTRESTO)  24-26 MG Take 1 tablet by mouth 2 (two) times daily.     Allergies:    Patient has no known allergies.   Social History: Social History   Socioeconomic History  . Marital status: Single    Spouse name: Not on file  . Number of children: Not on file  . Years of education: Not on file  . Highest education level: Not on file  Occupational History  . Not on file  Social Needs  . Financial resource strain: Not on file  . Food insecurity    Worry: Not on file    Inability: Not on file  . Transportation needs     Medical: Not on file    Non-medical: Not on file  Tobacco Use  . Smoking status: Never Smoker  . Smokeless tobacco: Never Used  Substance and Sexual Activity  . Alcohol use: No  . Drug use: No  . Sexual activity: Not on file  Lifestyle  . Physical activity    Days per week: Not on file    Minutes per session: Not on file  . Stress: Not on file  Relationships  . Social Herbalist on phone: Not on file    Gets together: Not on file    Attends religious service: Not on file    Active member of club or organization: Not on file    Attends meetings of clubs or organizations: Not on file    Relationship status: Not on file  Other Topics Concern  . Not on file  Social History Narrative  . Not on file     Family History: The patient's family history includes High blood pressure in his mother; Kidney failure in his father.  ROS:   All other ROS reviewed and negative. Pertinent positives noted in the HPI.     EKGs/Labs/Other Studies Reviewed:   The following studies were personally reviewed by me today:  EKG:  EKG is ordered today.  The ekg ordered today demonstrates normal sinus rhythm, heart rate 85, no ischemic ST-T changes, no evidence of prior infarct, and was personally reviewed by me.   LHC [id number]  LV end diastolic pressure is moderately elevated.  Hemodynamic findings consistent with mild pulmonary hypertension.   1. Normal coronary anatomy 2. Moderately elevated LV filling pressures. PCWP and LVEDP of 23 mm Hg 3. Mild pulmonary venous HTN 4. Preserved cardiac output. Index 3.22.  TTE 03/22/2019 1. Left ventricular ejection fraction, by visual estimation, is <20%. The left ventricle has severely decreased function. There is no left ventricular hypertrophy.  2. Definity contrast agent was given IV to delineate the left ventricular endocardial borders.  3. Moderately dilated left ventricular internal cavity size.  4. The left ventricle demonstrates  global hypokinesis.  5. Left ventricular diastolic parameters are indeterminate.  6. LV thrombus excluded by echo contrast.  7. Global right ventricle has severely reduced systolic function.The right ventricular size is not well visualized. Right vetricular wall thickness was not assessed.  8. Left atrial size was normal.  9. Right atrial size was normal. 10. Mild to moderate aortic valve annular calcification. 11. The mitral valve is normal in structure. Mild to moderate mitral valve regurgitation. 12. The tricuspid valve is normal in structure. Tricuspid valve regurgitation mild-moderate. 13. The aortic valve is tricuspid. Aortic valve regurgitation is not visualized. No evidence of aortic valve sclerosis or stenosis. 14. The pulmonic valve was not well visualized. Pulmonic valve regurgitation is not visualized. 15. Mildly elevated  pulmonary artery systolic pressure. 16. The inferior vena cava is dilated in size with <50% respiratory variability, suggesting right atrial pressure of 15 mmHg.  TEE/DCCV 03/26/2019  1. Left ventricular ejection fraction, by visual estimation, is 25 to 30%. The left ventricle has severely decreased function. Moderately increased left ventricular size. There is no left ventricular hypertrophy.  2. Dilated cardiomyopathy.  3. Global right ventricle has normal systolic function.The right ventricular size is normal. No increase in right ventricular wall thickness.  4. Left atrial size was moderately dilated.  5. No LAA thrombus.  6. Right atrial size was moderately dilated.  7. The mitral valve is normal in structure. No evidence of mitral valve regurgitation. Mild mitral stenosis.  8. The tricuspid valve is normal in structure. Tricuspid valve regurgitation is mild.  9. The aortic valve is normal in structure. Aortic valve regurgitation is not visualized. No evidence of aortic valve sclerosis or stenosis. 10. The pulmonic valve was normal in structure. Pulmonic  valve regurgitation is not visualized. 11. The inferior vena cava is normal in size with greater than 50% respiratory variability, suggesting right atrial pressure of 3 mmHg. 12. DCC: x 3 200J biphasic each. Converted form afib rate 122 to NSR rate 91 bpm.   Recent Labs: 03/21/2019: B Natriuretic Peptide 261.6 03/22/2019: TSH 1.220 03/24/2019: ALT 15 03/27/2019: BUN 14; Creatinine, Ser 1.16; Hemoglobin 14.8; Magnesium 2.0; Platelets 276; Potassium 4.0; Sodium 139   Recent Lipid Panel    Component Value Date/Time   CHOL 159 03/23/2019 1253   TRIG 105 03/23/2019 1253   HDL 32 (L) 03/23/2019 1253   CHOLHDL 5.0 03/23/2019 1253   VLDL 21 03/23/2019 1253   LDLCALC 106 (H) 03/23/2019 1253    Physical Exam:   VS:  BP (!) 132/98 (BP Location: Left Arm, Patient Position: Sitting, Cuff Size: Large)   Pulse 85   Temp 97.9 F (36.6 C)   Ht 5\' 11"  (1.803 m)   Wt (!) 334 lb (151.5 kg)   BMI 46.58 kg/m    Wt Readings from Last 3 Encounters:  03/31/19 (!) 334 lb (151.5 kg)  03/27/19 (!) 333 lb 8.9 oz (151.3 kg)  01/08/13 294 lb (133.4 kg)    General: Obese male no acute distress Heart: Atraumatic, normal size  Eyes: PEERLA, EOMI  Neck: Supple, no JVD Endocrine: No thryomegaly Cardiac: Normal S1, S2; RRR; no murmurs, rubs, or gallops Lungs: Clear to auscultation bilaterally, no wheezing, rhonchi or rales  Abd: Soft, nontender, no hepatomegaly  Ext: Trace edema Musculoskeletal: No deformities, BUE and BLE strength normal and equal Skin: Warm and dry, no rashes   Neuro: Alert and oriented to person, place, time, and situation, CNII-XII grossly intact, no focal deficits  Psych: Normal mood and affect   ASSESSMENT:   DRAYMOND SUMP is a 58 y.o. male who presents for the following: 1. Chronic systolic heart failure ([place])   2. Essential hypertension   3. Persistent atrial fibrillation (HCC)   4. Obesity, morbid, BMI 40.0-49.9 ([place])     PLAN:   1. Chronic systolic heart failure (HCC),  NYHA class I/II, ACC/AHA stage C, ejection fraction 25 to 30%, nonischemic etiology -Recently admitted to the hospital with new onset systolic heart failure in the setting of atrial fibrillation RVR.  Had a normal left heart catheterization.  Normal cardiac output and cardiac index of right heart catheterization.  He had nearly 16 L of fluid removed.  He was in atrial fibrillation and he underwent TEE cardioversion with  restoration of normal sinus rhythm.  Overall, his lab work was unremarkable for an identifiable etiology.  I do query if atrial fibrillation led to his cardiomyopathy.  He is maintaining sinus rhythm. -We will increase his Entresto to 49-51 mg twice daily.  I will have him return to see our pharmacist in 2 weeks for further titration to goal dose. -We will recheck a BMP today -We will continue 80 mg of Lasix daily.  He is euvolemic on exam today -Continue metoprolol succinate 100 mg daily.  Heart rate 85, will likely increase this at her next visit as well. -We will continue Aldactone 25 mg daily -We will plan to repeat an echocardiogram in 3 to 6 months to determine if he will need an ICD.  I am hopeful he will recover his heart function and normal rhythm.  2. Essential hypertension -Increase Entresto as above  3. Persistent atrial fibrillation ([place]), CHADSVASC =2 -Underwent TEE cardioversion on 11/6.  He is in normal sinus rhythm today.  We will continue with his amiodarone load which he completes today.  He will complete 200 mg daily for 3 more weeks.  We will plan to stop his amiodarone after this. -Continue Eliquis 5 mg twice daily.  I favor indefinite anticoagulation.  4. Obesity, morbid, BMI 40.0-49.9 ([place]) -Counseled on importance of weight loss and exercise   Disposition: Return in about 1 month (around 04/30/2019).  Medication Adjustments/Labs and Tests Ordered: Current medicines are reviewed at length with the patient today.  Concerns regarding medicines are outlined  above.  Orders Placed This Encounter  Procedures  . Basic metabolic panel  . AMB referral to cardiac rehabilitation-  phase II  . EKG 12-Lead   Meds ordered this encounter  Medications  . sacubitril-valsartan (ENTRESTO) 49-51 MG    Sig: Take 1 tablet by mouth 2 (two) times daily.    Dispense:  60 tablet    Refill:  2    DISCONTINUE 24/26 DOSING    Patient Instructions  Medication Instructions:   FOLLOW INSTRUCTION ON STOPPING AMIODARONE  INCREASE DOSE OF ENTRESTO 49/51 MG TAKE ONE TABLET TWICE A DAY   CONTINUE ALL OTHER MEDICATIONS  *If you need a refill on your cardiac medications before your next appointment, please call your pharmacy*  Lab Work: BMP TODAY   If you have labs (blood work) drawn today and your tests are completely normal, you will receive your results only by: Marland Kitchen MyChart Message (if you have MyChart) OR . A paper copy in the mail If you have any lab test that is abnormal or we need to change your treatment, we will call you to review the results.  Testing/Procedures: NOT NEEDED  Follow-Up: At [facility], you and your health needs are our priority.  As part of our continuing mission to provide you with exceptional heart care, we have created designated Provider Care Teams.  These Care Teams include your primary Cardiologist (physician) and Advanced Practice Providers (APPs -  Physician Assistants and Nurse Practitioners) who all work together to provide you with the care you need, when you need it.  Your next appointment:   3 months  The format for your next appointment:   In Person  Provider:   Eleonore Chiquito, MD  Other Instructions  Your physician recommends that you schedule a follow-up appointment in 2 WEEKS WITH CVRR- TO TITRATE MEDICATIONS    You have been referred to [place]      Signed, Addison Naegeli. Audie Box, MD  [place]  [address] [place], [place] [address] [phone]   03/31/2019 9:51 AM

## 2019-03-31 ENCOUNTER — Other Ambulatory Visit: Payer: Self-pay

## 2019-03-31 ENCOUNTER — Encounter: Payer: Self-pay | Admitting: Cardiovascular Disease

## 2019-03-31 ENCOUNTER — Ambulatory Visit (INDEPENDENT_AMBULATORY_CARE_PROVIDER_SITE_OTHER): Payer: BC Managed Care – PPO | Admitting: Cardiovascular Disease

## 2019-03-31 VITALS — BP 132/98 | HR 85 | Temp 97.9°F | Ht 71.0 in | Wt 334.0 lb

## 2019-03-31 DIAGNOSIS — I4819 Other persistent atrial fibrillation: Secondary | ICD-10-CM

## 2019-03-31 DIAGNOSIS — I10 Essential (primary) hypertension: Secondary | ICD-10-CM | POA: Diagnosis not present

## 2019-03-31 DIAGNOSIS — I5022 Chronic systolic (congestive) heart failure: Secondary | ICD-10-CM | POA: Diagnosis not present

## 2019-03-31 LAB — BASIC METABOLIC PANEL
BUN/Creatinine Ratio: 12 (ref 9–20)
BUN: 15 mg/dL (ref 6–24)
CO2: 23 mmol/L (ref 20–29)
Calcium: 9.5 mg/dL (ref 8.7–10.2)
Chloride: 99 mmol/L (ref 96–106)
Creatinine, Ser: 1.21 mg/dL (ref 0.76–1.27)
GFR calc Af Amer: 75 mL/min/{1.73_m2} (ref >59)
GFR calc non Af Amer: 65 mL/min/{1.73_m2} (ref >59)
Glucose: 82 mg/dL (ref 65–99)
Potassium: 4.9 mmol/L (ref 3.5–5.2)
Sodium: 140 mmol/L (ref 134–144)

## 2019-03-31 MED ORDER — ENTRESTO 49-51 MG PO TABS: 1.0000 | ORAL_TABLET | Freq: Two times a day (BID) | ORAL | 2 refills | Status: DC

## 2019-03-31 NOTE — Patient Instructions (Signed)
Medication Instructions:   FOLLOW INSTRUCTION ON STOPPING AMIODARONE  INCREASE DOSE OF ENTRESTO 49/51 MG TAKE ONE TABLET TWICE A DAY   CONTINUE ALL OTHER MEDICATIONS  *If you need a refill on your cardiac medications before your next appointment, please call your pharmacy*  Lab Work: BMP TODAY   If you have labs (blood work) drawn today and your tests are completely normal, you will receive your results only by: Marland Kitchen MyChart Message (if you have MyChart) OR . A paper copy in the mail If you have any lab test that is abnormal or we need to change your treatment, we will call you to review the results.  Testing/Procedures: NOT NEEDED  Follow-Up: At [facility], you and your health needs are our priority.  As part of our continuing mission to provide you with exceptional heart care, we have created designated Provider Care Teams.  These Care Teams include your primary Cardiologist (physician) and Advanced Practice Providers (APPs -  Physician Assistants and Nurse Practitioners) who all work together to provide you with the care you need, when you need it.  Your next appointment:   3 months  The format for your next appointment:   In Person  Provider:   Eleonore Chiquito, MD  Other Instructions  Your physician recommends that you schedule a follow-up appointment in 2 WEEKS WITH CVRR- TO TITRATE MEDICATIONS    You have been referred to [place]

## 2019-04-01 ENCOUNTER — Encounter (HOSPITAL_COMMUNITY): Payer: Self-pay | Admitting: *Deleted

## 2019-04-01 NOTE — Progress Notes (Signed)
Received referral from Dr. Eleonore Chiquito for this pt to participate in cardiac rehab with the diagnosis of Chronic Systolic Heart Failure. Clinical review of pt follow up appt on 11/11 with Dr. Marisue Ivan - cardiologist office note. Pt admittted on 11/2-11/7 with newly diagnosed systolic heart failure and afib. Pt is making the expected progress in recovery.  Pt appropriate for scheduling for on site cardiac rehab and/or enrollment in Virtual Cardiac Rehab.  Pt Covid score is 4.  Will forward to staff for follow up verification of benefits and scheduling. Cherre Huger, BSN Cardiac and Training and development officer

## 2019-04-02 ENCOUNTER — Telehealth (HOSPITAL_COMMUNITY): Payer: Self-pay

## 2019-04-02 NOTE — Telephone Encounter (Signed)
Called patient to see if he was interested in participating in the Cardiac Rehab Program. Patient stated yes. Patient will come in for orientation on 04/06/2019 @ 9AM and will attend the 9:15AM exercise class. Went over insurance, patient verbalized understanding.  Tourist information centre manager.

## 2019-04-02 NOTE — Telephone Encounter (Signed)
Pt insurance is active and benefits verified through [place]. Co-pay $0.00, DED $4,500.00/$3,916.00 met, out of pocket $7,350.00/$3,713.20 met, co-insurance 20%. No pre-authorization required. Passport, 04/02/2019 @ 12:14PM, TVN#[id number]

## 2019-04-04 ENCOUNTER — Telehealth (HOSPITAL_COMMUNITY): Payer: Self-pay

## 2019-04-05 ENCOUNTER — Telehealth (HOSPITAL_COMMUNITY): Payer: Self-pay

## 2019-04-05 ENCOUNTER — Telehealth (HOSPITAL_COMMUNITY): Payer: Self-pay | Admitting: *Deleted

## 2019-04-05 NOTE — Telephone Encounter (Signed)
PC to pt to confirm appointment for cardiac rehab orientation on 04/06/2019 at 0900.  Pt confirms appt.  Medications and health history reviewed.   Cardiac Rehab Medication Review by a RN  Does the patient  feel that his/her medications are working for him/her?  yes  Has the patient been experiencing any side effects to the medications prescribed?  no  Does the patient measure his/her own blood pressure or blood glucose at home?  yes   Does the patient have any problems obtaining medications due to transportation or finances?   Not at the moment.  Pt has been using copay cards for entresto and eliquis.  Once the cards run out, he might have issues affording these medications.  Understanding of regimen: good Understanding of indications: good Potential of compliance: good   RN comments: N/A    Noel Christmas 04/05/2019 10:28 AM

## 2019-04-05 NOTE — Telephone Encounter (Signed)
Unsuccessful telephone encounter to Mr. Parker Calderon to confirm CR orientation appointment 04/06/2019 at 9:00 and to do a brief health history with patient. HIPAA compliant VM message left requesting call back at [phone]. Mckyle Solanki E. Laray Anger, BSN

## 2019-04-06 ENCOUNTER — Other Ambulatory Visit: Payer: Self-pay

## 2019-04-06 ENCOUNTER — Telehealth: Payer: Self-pay | Admitting: Cardiovascular Disease

## 2019-04-06 ENCOUNTER — Telehealth (HOSPITAL_COMMUNITY): Payer: Self-pay | Admitting: *Deleted

## 2019-04-06 ENCOUNTER — Encounter (HOSPITAL_COMMUNITY)
Admission: RE | Admit: 2019-04-06 | Discharge: 2019-04-06 | Disposition: A | Discharge status: Home / Self Care | Payer: BC Managed Care – PPO | Source: Ambulatory Visit | Attending: Cardiovascular Disease | Admitting: Cardiovascular Disease

## 2019-04-06 ENCOUNTER — Encounter (HOSPITAL_COMMUNITY): Payer: Self-pay

## 2019-04-06 VITALS — BP 120/84 | Ht 69.0 in | Wt 326.3 lb

## 2019-04-06 DIAGNOSIS — I5022 Chronic systolic (congestive) heart failure: Secondary | ICD-10-CM

## 2019-04-06 NOTE — Telephone Encounter (Signed)
thanks

## 2019-04-06 NOTE — Telephone Encounter (Signed)
-----   Message from Geralynn Rile, MD sent at 04/06/2019  2:00 PM EST ----- Regarding: RE: Parker Calderon dosage/PVCs No changes needed at this time. Thanks for the update. -W ----- Message ----- From: Benedetto Goad, RN Sent: 04/06/2019   9:31 AM EST To: Geralynn Rile, MD, Magda Kiel, RN Subject: Parker Calderon dosage/PVCs                           Hi Dr. Audie Box,  FYI:  Parker Calderon presented to cardiac rehab orientation today. Upon review of his medications, it is discovered that he has not increased his Entresto dosage from 24-26 to 49-51 as indicated on 03/31/2019. I have discussed with him the importance of medication compliance and increasing his dosage as prescribed. He will pick up his increased dosage today and begin taking this pm. Please let me know if you need any additional information. I will follow up with him at his first exercise session on Monday.  Also, I am faxing over his strips from his 6 min walk test. No ectopy pre and during walk test however immediately post he had a significant amount of PVCs. Asymptomatic. Most recent labs reviewed. Please advise as needed.  Thank you, [name] E. [name]

## 2019-04-06 NOTE — Telephone Encounter (Signed)
New message    Please call [name] at Cardiac Rehab to discuss Parker Calderon , call direct to [phone]

## 2019-04-06 NOTE — Telephone Encounter (Signed)
Spoke with Truddie Crumble and she is requesting to speak with Pharm D  Patient never picked up increased dose of Entresto and did not have copay card activated. She has taken care of getting all of that straight she just wanted to give Pharm D heads up since follow up appointment with them in couple of weeks.  Patients SBP today 140's Will forward to Pharm D, per [name] no need to call back unless additional questions

## 2019-04-06 NOTE — Progress Notes (Signed)
Cardiac Individual Treatment Plan  Patient Details  Name: Parker Calderon MRN: [id number] Date of Birth: 1960-06-12 Referring Provider:     CARDIAC REHAB PHASE II ORIENTATION from 04/06/2019 in [place]  Referring Provider  O'Neal, Cassie Freer, MD      Initial Encounter Date:    CARDIAC REHAB PHASE II ORIENTATION from 04/06/2019 in [place]  Date  04/06/19      Visit Diagnosis: Heart failure, chronic systolic ([place])  Patient's Home Medications on Admission:  Current Outpatient Medications:  .  amiodarone (PACERONE) 200 MG tablet, Take 200 mg by mouth daily., Disp: , Rfl:  .  apixaban (ELIQUIS) 5 MG TABS tablet, Take 1 tablet (5 mg total) by mouth 2 (two) times daily., Disp: 180 tablet, Rfl: 3 .  atorvastatin (LIPITOR) 40 MG tablet, Take 1 tablet (40 mg total) by mouth daily at 6 PM., Disp: 90 tablet, Rfl: 3 .  furosemide (LASIX) 80 MG tablet, Take 1 tablet (80 mg total) by mouth daily., Disp: 90 tablet, Rfl: 3 .  metoprolol succinate (TOPROL-XL) 100 MG 24 hr tablet, Take 1 tablet (100 mg total) by mouth daily. Take with or immediately following a meal., Disp: 90 tablet, Rfl: 3 .  Multiple Vitamin (MULTIVITAMIN) tablet, Take 1 tablet by mouth daily., Disp: , Rfl:  .  sacubitril-valsartan (ENTRESTO) 49-51 MG, Take 1 tablet by mouth 2 (two) times daily., Disp: 60 tablet, Rfl: 2 .  spironolactone (ALDACTONE) 25 MG tablet, Take 1 tablet (25 mg total) by mouth daily., Disp: 90 tablet, Rfl: 3  Past Medical History: Past Medical History:  Diagnosis Date  . Arthritis    Rhinitis  . Atrial fibrillation ([place])   . CHF (congestive heart failure) ([place])   . Hyperlipidemia   . Hypertension   . Irritable bowel syndrome   . Left thigh pain    Lateral, aching  . Obesity, unspecified   . Varicose veins     Tobacco Use: Social History   Tobacco Use  Smoking Status Never Smoker  Smokeless Tobacco Never Used     Labs: Recent Review Flowsheet Data    Labs for ITP Cardiac and Pulmonary Rehab Latest Ref Rng & Units 03/22/2019 03/23/2019 03/25/2019 03/25/2019   Cholestrol 0 - 200 mg/dL 159 159 - -   LDLCALC 0 - 99 mg/dL 119(H) 106(H) - -   HDL >40 mg/dL 31(L) 32(L) - -   Trlycerides <150 mg/dL 44 105 - -   Hemoglobin A1c 4.8 - 5.6 % 6.1(H) - - -   PHART 7.350 - 7.450 - - - 7.479(H)   PCO2ART 32.0 - 48.0 mmHg - - - 41.2   HCO3 20.0 - 28.0 mmol/L - - 30.0(H) 30.6(H)   TCO2 22 - 32 mmol/L - - 31 32   O2SAT % - - 77.0 99.0      Capillary Blood Glucose: No results found for: GLUCAP   Exercise Target Goals: Exercise Program Goal: Individual exercise prescription set using results from initial 6 min walk test and THRR while considering  patient's activity barriers and safety.   Exercise Prescription Goal: Starting with aerobic activity 30 plus minutes a day, 3 days per week for initial exercise prescription. Provide home exercise prescription and guidelines that participant acknowledges understanding prior to discharge.  Activity Barriers & Risk Stratification: Activity Barriers & Cardiac Risk Stratification - 04/06/19 1207      Activity Barriers & Cardiac Risk Stratification   Activity Barriers  None  Cardiac Risk Stratification  High       6 Minute Walk: 6 Minute Walk    Row Name 04/06/19 0947         6 Minute Walk   Phase  Initial     Distance  1400 feet     Walk Time  6 minutes     # of Rest Breaks  0     MPH  2.65     METS  2.61     RPE  11     Perceived Dyspnea   0     VO2 Peak  9.13     Symptoms  No     Resting HR  86 bpm     Resting BP  120/84     Resting Oxygen Saturation   98 %     Exercise Oxygen Saturation  during 6 min walk  96 %     Max Ex. HR  104 bpm     Max Ex. BP  148/76     2 Minute Post BP  132/82        Oxygen Initial Assessment:   Oxygen Re-Evaluation:   Oxygen Discharge (Final Oxygen Re-Evaluation):   Initial Exercise  Prescription: Initial Exercise Prescription - 04/06/19 1200      Date of Initial Exercise RX and Referring Provider   Date  04/06/19    Referring Provider  O'Neal, Cassie Freer, MD    Expected Discharge Date  06/04/19      Treadmill   MPH  2    Grade  0    Minutes  15    METs  2.53      NuStep   Level  3    SPM  85    Minutes  15    METs  2.5      Prescription Details   Frequency (times per week)  3    Duration  Progress to 30 minutes of continuous aerobic without signs/symptoms of physical distress      Intensity   THRR 40-80% of Max Heartrate  64-129    Ratings of Perceived Exertion  11-13    Perceived Dyspnea  0-4      Progression   Progression  Continue to progress workloads to maintain intensity without signs/symptoms of physical distress.      Resistance Training   Training Prescription  Yes    Weight  5lbs    Reps  10-15       Perform Capillary Blood Glucose checks as needed.  Exercise Prescription Changes:   Exercise Comments:   Exercise Goals and Review: Exercise Goals    Row Name 04/06/19 1008             Exercise Goals   Increase Physical Activity  Yes       Intervention  Provide advice, education, support and counseling about physical activity/exercise needs.;Develop an individualized exercise prescription for aerobic and resistive training based on initial evaluation findings, risk stratification, comorbidities and participant's personal goals.       Expected Outcomes  Short Term: Attend rehab on a regular basis to increase amount of physical activity.;Long Term: Exercising regularly at least 3-5 days a week.;Long Term: Add in home exercise to make exercise part of routine and to increase amount of physical activity.       Increase Strength and Stamina  Yes       Intervention  Provide advice, education, support and counseling about physical activity/exercise needs.;Develop an individualized exercise  prescription for aerobic and resistive  training based on initial evaluation findings, risk stratification, comorbidities and participant's personal goals.       Expected Outcomes  Short Term: Increase workloads from initial exercise prescription for resistance, speed, and METs.;Short Term: Perform resistance training exercises routinely during rehab and add in resistance training at home;Long Term: Improve cardiorespiratory fitness, muscular endurance and strength as measured by increased METs and functional capacity (6MWT)       Able to understand and use rate of perceived exertion (RPE) scale  Yes       Intervention  Provide education and explanation on how to use RPE scale       Expected Outcomes  Short Term: Able to use RPE daily in rehab to express subjective intensity level;Long Term:  Able to use RPE to guide intensity level when exercising independently       Knowledge and understanding of Target Heart Rate Range (THRR)  Yes       Intervention  Provide education and explanation of THRR including how the numbers were predicted and where they are located for reference       Expected Outcomes  Short Term: Able to state/look up THRR;Long Term: Able to use THRR to govern intensity when exercising independently;Short Term: Able to use daily as guideline for intensity in rehab       Able to check pulse independently  Yes       Intervention  Provide education and demonstration on how to check pulse in carotid and radial arteries.;Review the importance of being able to check your own pulse for safety during independent exercise       Expected Outcomes  Short Term: Able to explain why pulse checking is important during independent exercise;Long Term: Able to check pulse independently and accurately       Understanding of Exercise Prescription  Yes       Intervention  Provide education, explanation, and written materials on patient's individual exercise prescription       Expected Outcomes  Short Term: Able to explain program exercise  prescription;Long Term: Able to explain home exercise prescription to exercise independently          Exercise Goals Re-Evaluation :    Discharge Exercise Prescription (Final Exercise Prescription Changes):   Nutrition:  Target Goals: Understanding of nutrition guidelines, daily intake of sodium 1500mg , cholesterol 200mg , calories 30% from fat and 7% or less from saturated fats, daily to have 5 or more servings of fruits and vegetables.  Biometrics: Pre Biometrics - 04/06/19 0913      Pre Biometrics   Height  5\' 9"  (1.753 m)    Waist Circumference  52.25 inches    Hip Circumference  56.5 inches    Waist to Hip Ratio  0.92 %    BMI (Calculated)  48.16    Triceps Skinfold  42 mm    % Body Fat  45 %    Grip Strength  42.5 kg    Flexibility  14.75 in    Single Leg Stand  2.25 seconds        Nutrition Therapy Plan and Nutrition Goals:   Nutrition Assessments:   Nutrition Goals Re-Evaluation:   Nutrition Goals Discharge (Final Nutrition Goals Re-Evaluation):   Psychosocial: Target Goals: Acknowledge presence or absence of significant depression and/or stress, maximize coping skills, provide positive support system. Participant is able to verbalize types and ability to use techniques and skills needed for reducing stress and depression.  Initial Review &  Psychosocial Screening: Initial Psych Review & Screening - 04/06/19 1149      Initial Review   Current issues with  Current Stress Concerns    Source of Stress Concerns  Chronic Illness      Family Dynamics   Good Support System?  Yes   Kazuma has his wife and children for support   Comments  Ryo has stress due to his newly diagnosed heart failure      Barriers   Psychosocial barriers to participate in program  The patient should benefit from training in stress management and relaxation.      Screening Interventions   Interventions  Encouraged to exercise       Quality of Life Scores: Quality of Life -  04/06/19 1215      Quality of Life   Select  Quality of Life      Quality of Life Scores   Health/Function Pre  23.8 %    Socioeconomic Pre  24.14 %    Psych/Spiritual Pre  24 %    Family Pre  25.9 %    GLOBAL Pre  24.22 %      Scores of 19 and below usually indicate a poorer quality of life in these areas.  A difference of  2-3 points is a clinically meaningful difference.  A difference of 2-3 points in the total score of the Quality of Life Index has been associated with significant improvement in overall quality of life, self-image, physical symptoms, and general health in studies assessing change in quality of life.  PHQ-9: Recent Review Flowsheet Data    Depression screen [facility] 2/9 04/06/2019   Decreased Interest 0   Down, Depressed, Hopeless 0   PHQ - 2 Score 0     Interpretation of Total Score  Total Score Depression Severity:  1-4 = Minimal depression, 5-9 = Mild depression, 10-14 = Moderate depression, 15-19 = Moderately severe depression, 20-27 = Severe depression   Psychosocial Evaluation and Intervention:   Psychosocial Re-Evaluation:   Psychosocial Discharge (Final Psychosocial Re-Evaluation):   Vocational Rehabilitation: Provide vocational rehab assistance to qualifying candidates.   Vocational Rehab Evaluation & Intervention: Vocational Rehab - 04/06/19 1154      Initial Vocational Rehab Evaluation & Intervention   Assessment shows need for Vocational Rehabilitation  No       Education: Education Goals: Education classes will be provided on a weekly basis, covering required topics. Participant will state understanding/return demonstration of topics presented.  Learning Barriers/Preferences: Learning Barriers/Preferences - 04/06/19 1244      Learning Barriers/Preferences   Learning Barriers  Sight   Wears glasses   Learning Preferences  Written Material;Pictoral;Video       Education Topics: Hypertension, Hypertension Reduction -Define heart  disease and high blood pressure. Discus how high blood pressure affects the body and ways to reduce high blood pressure.   Exercise and Your Heart -Discuss why it is important to exercise, the FITT principles of exercise, normal and abnormal responses to exercise, and how to exercise safely.   Angina -Discuss definition of angina, causes of angina, treatment of angina, and how to decrease risk of having angina.   Cardiac Medications -Review what the following cardiac medications are used for, how they affect the body, and side effects that may occur when taking the medications.  Medications include Aspirin, Beta blockers, calcium channel blockers, ACE Inhibitors, angiotensin receptor blockers, diuretics, digoxin, and antihyperlipidemics.   Congestive Heart Failure -Discuss the definition of CHF, how to live with  CHF, the signs and symptoms of CHF, and how keep track of weight and sodium intake.   Heart Disease and Intimacy -Discus the effect sexual activity has on the heart, how changes occur during intimacy as we age, and safety during sexual activity.   Smoking Cessation / COPD -Discuss different methods to quit smoking, the health benefits of quitting smoking, and the definition of COPD.   Nutrition I: Fats -Discuss the types of cholesterol, what cholesterol does to the heart, and how cholesterol levels can be controlled.   Nutrition II: Labels -Discuss the different components of food labels and how to read food label   Heart Parts/Heart Disease and PAD -Discuss the anatomy of the heart, the pathway of blood circulation through the heart, and these are affected by heart disease.   Stress I: Signs and Symptoms -Discuss the causes of stress, how stress may lead to anxiety and depression, and ways to limit stress.   Stress II: Relaxation -Discuss different types of relaxation techniques to limit stress.   Warning Signs of Stroke / TIA -Discuss definition of a stroke,  what the signs and symptoms are of a stroke, and how to identify when someone is having stroke.   Knowledge Questionnaire Score: Knowledge Questionnaire Score - 04/06/19 1216      Knowledge Questionnaire Score   Pre Score  20/24       Core Components/Risk Factors/Patient Goals at Admission: Personal Goals and Risk Factors at Admission - 04/06/19 1216      Core Components/Risk Factors/Patient Goals on Admission    Weight Management  Yes;Obesity    Intervention  Weight Management/Obesity: Establish reasonable short term and long term weight goals.;Obesity: Provide education and appropriate resources to help participant work on and attain dietary goals.    Admit Weight  326 lb 4.5 oz (148 kg)    Goal Weight: Short Term  320 lb (145.2 kg)    Goal Weight: Long Term  226 lb (102.5 kg)    Expected Outcomes  Short Term: Continue to assess and modify interventions until short term weight is achieved;Long Term: Adherence to nutrition and physical activity/exercise program aimed toward attainment of established weight goal;Weight Loss: Understanding of general recommendations for a balanced deficit meal plan, which promotes 1-2 lb weight loss per week and includes a negative energy balance of [phone] kcal/d;Understanding of distribution of calorie intake throughout the day with the consumption of 4-5 meals/snacks    Heart Failure  Yes    Intervention  Provide a combined exercise and nutrition program that is supplemented with education, support and counseling about heart failure. Directed toward relieving symptoms such as shortness of breath, decreased exercise tolerance, and extremity edema.    Expected Outcomes  Improve functional capacity of life;Short term: Attendance in program 2-3 days a week with increased exercise capacity. Reported lower sodium intake. Reported increased fruit and vegetable intake. Reports medication compliance.;Short term: Daily weights obtained and reported for increase.  Utilizing diuretic protocols set by physician.;Long term: Adoption of self-care skills and reduction of barriers for early signs and symptoms recognition and intervention leading to self-care maintenance.    Hypertension  Yes    Intervention  Provide education on lifestyle modifcations including regular physical activity/exercise, weight management, moderate sodium restriction and increased consumption of fresh fruit, vegetables, and low fat dairy, alcohol moderation, and smoking cessation.;Monitor prescription use compliance.    Expected Outcomes  Short Term: Continued assessment and intervention until BP is < 140/85mm HG in hypertensive participants. < 130/4mm HG  in hypertensive participants with diabetes, heart failure or chronic kidney disease.;Long Term: Maintenance of blood pressure at goal levels.    Lipids  Yes    Intervention  Provide education and support for participant on nutrition & aerobic/resistive exercise along with prescribed medications to achieve LDL 70mg , HDL >40mg .    Expected Outcomes  Short Term: Participant states understanding of desired cholesterol values and is compliant with medications prescribed. Participant is following exercise prescription and nutrition guidelines.;Long Term: Cholesterol controlled with medications as prescribed, with individualized exercise RX and with personalized nutrition plan. Value goals: LDL < 70mg , HDL > 40 mg.       Core Components/Risk Factors/Patient Goals Review:    Core Components/Risk Factors/Patient Goals at Discharge (Final Review):    ITP Comments: ITP Comments    Row Name 04/06/19 0912           ITP Comments  Dr. Fransico Him Medical Director Cardiac Rehab [facility]          Comments: Zalmen attended orientation on 04/06/2019 to review rules and guidelines for program.  Completed 6 minute walk test, Intitial ITP, and exercise prescription.  VSS. Telemetry-Sinus Rhythm.  Asymptomatic.Patient was noted to have frequent PVC's  At  the end of his walk test.ECG tracings were faxed to Dr Kathalene Frames office for review.Safety measures and social distancing in place per CDC guidelines.Patient was also noted to be taking the previously ordered dose of his entresto and had not started taking his new dose. Patient will plan to pick up his currently prescribed dose and will start taking today. Dr Kathalene Frames has been notified.Barnet Pall, RN,BSN 04/06/2019 1:03 PM

## 2019-04-12 ENCOUNTER — Encounter (HOSPITAL_COMMUNITY)
Admission: RE | Admit: 2019-04-12 | Discharge: 2019-04-12 | Disposition: A | Discharge status: Home / Self Care | Payer: BC Managed Care – PPO | Source: Ambulatory Visit | Attending: Cardiovascular Disease | Admitting: Cardiovascular Disease

## 2019-04-12 ENCOUNTER — Other Ambulatory Visit: Payer: Self-pay

## 2019-04-12 DIAGNOSIS — I5022 Chronic systolic (congestive) heart failure: Secondary | ICD-10-CM | POA: Diagnosis not present

## 2019-04-12 NOTE — Progress Notes (Signed)
Daily Session Note  Patient Details  Name: Parker Calderon MRN: [id number] Date of Birth: 22-May-1960 Referring Provider:     CARDIAC REHAB PHASE II ORIENTATION from 04/06/2019 in [place]  Referring Provider  O'Neal, Cassie Freer, MD      Encounter Date: 04/12/2019  Check In: Session Check In - 04/12/19 0956      Check-In   Supervising physician immediately available to respond to emergencies  [facility] immediately available    Physician(s)  Dr. Earlie Counts    Location  [facility]    Staff Present  Dorma Russell, MS,ACSM CEP, Exercise Physiologist;Maria Whitaker, RN, Toma Deiters, RN, BSN    Virtual Visit  No    Medication changes reported      No    Fall or balance concerns reported     No    Tobacco Cessation  No Change    Warm-up and Cool-down  Performed on first and last piece of equipment    Resistance Training Performed  Yes    VAD Patient?  No    PAD/SET Patient?  No      Pain Assessment   Currently in Pain?  No/denies    Pain Score  0-No pain    Multiple Pain Sites  No       Capillary Blood Glucose: No results found for this or any previous visit (from the past 24 hour(s)).  Exercise Prescription Changes - 04/12/19 1400      Response to Exercise   Blood Pressure (Admit)  124/68    Blood Pressure (Exercise)  158/70    Blood Pressure (Exit)  112/68    Heart Rate (Admit)  82 bpm    Heart Rate (Exercise)  111 bpm    Heart Rate (Exit)  79 bpm    Rating of Perceived Exertion (Exercise)  11    Symptoms  None    Comments  Pt first day of exercise.     Duration  Continue with 30 min of aerobic exercise without signs/symptoms of physical distress.    Intensity  THRR unchanged      Progression   Progression  Continue to progress workloads to maintain intensity without signs/symptoms of physical distress.    Average METs  2.3      Resistance Training   Training Prescription  Yes    Weight  5lbs    Reps   10-15    Time  10 Minutes      Treadmill   MPH  2    Grade  0    Minutes  15    METs  2.53      NuStep   Level  3    SPM  85    Minutes  15    METs  2.2       Social History   Tobacco Use  Smoking Status Never Smoker  Smokeless Tobacco Never Used    Goals Met:  Exercise tolerated well  Goals Unmet:  Not Applicable  Comments: Pt started cardiac rehab today.  Pt tolerated light exercise without difficulty. VSS, telemetry-SR with occasional PVC's, asymptomatic.  Medication list reconciled. Pt denies barriers to medicaiton compliance.  PSYCHOSOCIAL ASSESSMENT:  PHQ-0. Pt exhibits positive coping skills, hopeful outlook with supportive family. No psychosocial needs identified at this time, no psychosocial interventions necessary.  Pt oriented to exercise equipment and routine.    Understanding verbalized.    Dr. Fransico Him is Medical Director for Cardiac  Rehab at [facility].

## 2019-04-13 ENCOUNTER — Ambulatory Visit: Payer: BLUE CROSS/BLUE SHIELD | Admitting: Cardiovascular Disease

## 2019-04-14 ENCOUNTER — Encounter (HOSPITAL_COMMUNITY)
Admission: RE | Admit: 2019-04-14 | Discharge: 2019-04-14 | Disposition: A | Discharge status: Home / Self Care | Payer: BC Managed Care – PPO | Source: Ambulatory Visit | Attending: Cardiovascular Disease | Admitting: Cardiovascular Disease

## 2019-04-14 ENCOUNTER — Other Ambulatory Visit: Payer: Self-pay

## 2019-04-14 DIAGNOSIS — I5022 Chronic systolic (congestive) heart failure: Secondary | ICD-10-CM | POA: Diagnosis not present

## 2019-04-14 NOTE — Progress Notes (Signed)
Parker Calderon 58 y.o. male Nutrition Note Spoke with pt. Nutrition Plan and Nutrition Survey goals reviewed with pt. Pt is following a Heart Healthy diet. Pt wants to lose wt. Pt has been trying to lose wt by limiting chips, soda, and reading labels. Wt loss tips reviewed (label reading, how to build a healthy plate, portion sizes, eating frequently across the day).  Pt has Pre-diabetes. Last A1c indicates blood glucose well-controlled. This writer went over foods to limit for best control and provided education on how carbohydrates affect blood sugar.  Pt with dx of CHF. Per discussion, pt does use canned/convenience foods often. Pt does not add salt to food. Pt does not eat out frequently.   Pt expressed understanding of the information reviewed.   Lab Results  Component Value Date   HGBA1C 6.1 (H) 03/22/2019    Wt Readings from Last 3 Encounters:  04/06/19 (!) 326 lb 4.5 oz (148 kg)  03/31/19 (!) 334 lb (151.5 kg)  03/27/19 (!) 333 lb 8.9 oz (151.3 kg)    Nutrition Diagnosis ? Food-and nutrition-related knowledge deficit related to lack of exposure to information as related to diagnosis of: ? CVD ? Pre-diabetes ? Obese  III = 40+ related to excessive energy intake as evidenced by a 48.1  Nutrition Intervention ? Pt's individual nutrition plan reviewed with pt. ? Benefits of adopting Heart Healthy diet discussed when Medficts reviewed.   ? Continue client-centered nutrition education by RD, as part of interdisciplinary care.  Goal(s) ? Pt to identify and limit food sources of saturated fat, trans fat, refined carbohydrates and sodium ? Pt to identify food quantities necessary to achieve weight loss of 6-24 lb at graduation from cardiac rehab.  ? Pt able to name foods that affect blood glucose  Plan:    Will provide client-centered nutrition education as part of interdisciplinary care  Monitor and evaluate progress toward nutrition goal with team.   Michaele Offer, MS,  RDN, LDN

## 2019-04-16 ENCOUNTER — Encounter (HOSPITAL_COMMUNITY): Payer: BC Managed Care – PPO

## 2019-04-19 ENCOUNTER — Encounter (HOSPITAL_COMMUNITY)
Admission: RE | Admit: 2019-04-19 | Discharge: 2019-04-19 | Disposition: A | Discharge status: Home / Self Care | Payer: BC Managed Care – PPO | Source: Ambulatory Visit | Attending: Cardiovascular Disease | Admitting: Cardiovascular Disease

## 2019-04-19 ENCOUNTER — Other Ambulatory Visit: Payer: Self-pay

## 2019-04-19 DIAGNOSIS — I5022 Chronic systolic (congestive) heart failure: Secondary | ICD-10-CM

## 2019-04-20 ENCOUNTER — Ambulatory Visit (INDEPENDENT_AMBULATORY_CARE_PROVIDER_SITE_OTHER): Payer: BC Managed Care – PPO | Admitting: Pharmacist

## 2019-04-20 DIAGNOSIS — I5020 Unspecified systolic (congestive) heart failure: Secondary | ICD-10-CM | POA: Diagnosis not present

## 2019-04-20 NOTE — Patient Instructions (Addendum)
Return for a  follow up appointment as needed   Check your blood pressure at home daily (if able) and keep record of the readings.  Take your BP meds as follows: *NO MEDICATION CHANGE*  *REPEAT blood work during office visit with DR Audie Box on Dec/11/2018*  Bring all of your meds, your BP cuff and your record of home blood pressures to your next appointment.  Exercise as you're able, try to walk approximately 30 minutes per day.  Keep salt intake to a minimum, especially watch canned and prepared boxed foods.  Eat more fresh fruits and vegetables and fewer canned items.  Avoid eating in fast food restaurants.    HOW TO TAKE YOUR BLOOD PRESSURE: . Rest 5 minutes before taking your blood pressure. .  Don't smoke or drink caffeinated beverages for at least 30 minutes before. . Take your blood pressure before (not after) you eat. . Sit comfortably with your back supported and both feet on the floor (don't cross your legs). . Elevate your arm to heart level on a table or a desk. . Use the proper sized cuff. It should fit smoothly and snugly around your bare upper arm. There should be enough room to slip a fingertip under the cuff. The bottom edge of the cuff should be 1 inch above the crease of the elbow. . Ideally, take 3 measurements at one sitting and record the average.

## 2019-04-20 NOTE — Progress Notes (Signed)
Patient ID: Parker Calderon                 DOB: [birth year]                      MRN: [id number]     HPI: Parker Calderon is a 58 y.o. male referred by Dr. Audie Box to pharmacist clinic.  PMH includes HFrEF (EF 25-30%), atrial fibrillation, obesity, hyperlipidemia, and hypertension.  Patient is already on Entresto, spironolactone, and metoprolol for disease management. Patient continues to use furosemide 80gm daily for fluid management. He reports less SOB, more energy, feeling better in general. Denies problems with current therapy and reports compliance.  Currnt HTN meds:  Furosemide 80mg  daily Metoprolol succinate 100mg  daily Entresto 49-51mg  twice daily - dose increased 11/17 Spironolactone 25mg  daily  Intolerance: none  BP goal: <130/80  Family History: The patient's family history includes High blood pressure in his mother; Kidney failure in his father.  Social History: denies tobacco or occasional wine with wife (rare)  Diet: less soda, no more energy drinks, sugar free lemon  Exercise: cardiac rehab  Home BP readings:  Wrist cuff ([facility]) - accurate less than 40mmHg from  reading (121/79) 10 readings; average 120/64; HR range 63-72bpm  Wt Readings from Last 3 Encounters:  04/20/19 (!) 322 lb 12.8 oz (146.4 kg)  04/06/19 (!) 326 lb 4.5 oz (148 kg)  03/31/19 (!) 334 lb (151.5 kg)   BP Readings from Last 3 Encounters:  04/20/19 128/76  04/06/19 120/84  03/31/19 (!) 132/98   Pulse Readings from Last 3 Encounters:  04/20/19 68  03/31/19 85  03/27/19 84   Renal function: CrCl cannot be calculated (Patient's most recent lab result is older than the maximum 21 days allowed.).  Past Medical History:  Diagnosis Date  . Arthritis    Rhinitis  . Atrial fibrillation ([place])   . CHF (congestive heart failure) ([place])   . Hyperlipidemia   . Hypertension   . Irritable bowel syndrome   . Left thigh pain    Lateral, aching  . Obesity, unspecified   . Varicose veins      Current Outpatient Medications on File Prior to Visit  Medication Sig Dispense Refill  . amiodarone (PACERONE) 200 MG tablet Take 200 mg by mouth daily.    Marland Kitchen apixaban (ELIQUIS) 5 MG TABS tablet Take 1 tablet (5 mg total) by mouth 2 (two) times daily. 180 tablet 3  . atorvastatin (LIPITOR) 40 MG tablet Take 1 tablet (40 mg total) by mouth daily at 6 PM. 90 tablet 3  . furosemide (LASIX) 80 MG tablet Take 1 tablet (80 mg total) by mouth daily. 90 tablet 3  . metoprolol succinate (TOPROL-XL) 100 MG 24 hr tablet Take 1 tablet (100 mg total) by mouth daily. Take with or immediately following a meal. 90 tablet 3  . Multiple Vitamin (MULTIVITAMIN) tablet Take 1 tablet by mouth daily.    . sacubitril-valsartan (ENTRESTO) 49-51 MG Take 1 tablet by mouth 2 (two) times daily. 60 tablet 2  . spironolactone (ALDACTONE) 25 MG tablet Take 1 tablet (25 mg total) by mouth daily. 90 tablet 3   No current facility-administered medications on file prior to visit.     No Known Allergies  Blood pressure 128/76, pulse 68, height 5\' 11"  (1.803 m), weight (!) 322 lb 12.8 oz (146.4 kg), SpO2 97 %.  HFrEF (heart failure with reduced ejection fraction) ([place]) Patient currently on Entresto, spironolactone, and metoprolol succinate  for HR management. Continues on furosemide 80mg  daily for fluid management. He denies problems with current therapy and BP/HR remain appropriate. BP range variated from A999333 to 123XX123 systolic and HR above A999333.   Will continue current medication at current doses and repeats BMET in 2 week .Plan to follow up as needed for further BP management and medication titration.    Liesa Tsan Rodriguez-Guzman PharmD, BCPS, [place] [address] [place] [address] 04/21/2019 2:15 PM

## 2019-04-21 ENCOUNTER — Encounter (HOSPITAL_COMMUNITY)
Admission: RE | Admit: 2019-04-21 | Discharge: 2019-04-21 | Disposition: A | Discharge status: Home / Self Care | Payer: BC Managed Care – PPO | Source: Ambulatory Visit | Attending: Cardiovascular Disease | Admitting: Cardiovascular Disease

## 2019-04-21 ENCOUNTER — Encounter: Payer: Self-pay | Admitting: Pharmacist

## 2019-04-21 ENCOUNTER — Other Ambulatory Visit: Payer: Self-pay

## 2019-04-21 DIAGNOSIS — I5022 Chronic systolic (congestive) heart failure: Secondary | ICD-10-CM | POA: Diagnosis not present

## 2019-04-21 DIAGNOSIS — I5020 Unspecified systolic (congestive) heart failure: Secondary | ICD-10-CM | POA: Insufficient documentation

## 2019-04-21 NOTE — Assessment & Plan Note (Signed)
Patient currently on Entresto, spironolactone, and metoprolol succinate for HR management. Continues on furosemide 80mg  daily for fluid management. He denies problems with current therapy and BP/HR remain appropriate. BP range variated from A999333 to 123XX123 systolic and HR above A999333.   Will continue current medication at current doses and repeats BMET in 2 week .Plan to follow up as needed for further BP management and medication titration.

## 2019-04-23 ENCOUNTER — Other Ambulatory Visit: Payer: Self-pay

## 2019-04-23 ENCOUNTER — Encounter (HOSPITAL_COMMUNITY)
Admission: RE | Admit: 2019-04-23 | Discharge: 2019-04-23 | Disposition: A | Discharge status: Home / Self Care | Payer: BC Managed Care – PPO | Source: Ambulatory Visit | Attending: Cardiovascular Disease | Admitting: Cardiovascular Disease

## 2019-04-23 DIAGNOSIS — I5022 Chronic systolic (congestive) heart failure: Secondary | ICD-10-CM | POA: Diagnosis not present

## 2019-04-25 NOTE — Progress Notes (Signed)
Cardiology Office Note:   Date:  04/26/2019  NAME:  Parker Calderon    MRN: [id number] DOB:  1961-02-08   PCP:  Shirline Frees, MD  Cardiologist:  Evalina Field, MD   Referring MD: Shirline Frees, MD   Chief Complaint  Patient presents with  . Congestive Heart Failure   History of Present Illness:   Parker Calderon is a 58 y.o. male with a hx of newly diagnosed systolic HF (A999333, normal LHC), persistent Afib s/p TEE/DCCV, morbidy obesity (BMI 46), HTN who presents for follow-up of congestive heart failure.  He reports he is doing well.  He continues to participate in cardiac rehab and is doing well with this.  Review of blood pressure log shows his blood pressure is acceptable.  He continues to lose weight as well.  He has no symptoms of chest pain or trouble breathing with activity.  He can exercise up to 30 minutes daily without any limitations.  Regarding lower extremity edema, he reports he has hardly any.  He continues to take Lasix 80 mg daily.  Regarding his atrial fibrillation, he is in normal sinus rhythm today.  He does have frequent PACs on his EKG.  He has no symptoms of palpitations or trouble breathing.  No further recurrence from what I can tell.  He is currently on Entresto 49-51 dose.  We do need to get him up to 97-103.  His amiodarone therapy will stop in a few days.  Problem List: 1. Non-ischemic CM (EF 25%, 03/2019) 2. Normal coronaries on [facility] 03/25/2019 3. Atrial fibrillation s/p DCCV 03/2019 4. Morbid obesity   Past Medical History: Past Medical History:  Diagnosis Date  . Arthritis    Rhinitis  . Atrial fibrillation ([place])   . CHF (congestive heart failure) ([place])   . Hyperlipidemia   . Hypertension   . Irritable bowel syndrome   . Left thigh pain    Lateral, aching  . Obesity, unspecified   . Varicose veins     Past Surgical History: Past Surgical History:  Procedure Laterality Date  . CARDIAC CATHETERIZATION    . CARDIOVERSION N/A 03/26/2019   Procedure: CARDIOVERSION;  Surgeon: Josue Hector, MD;  Location: [facility] ENDOSCOPY;  Service: Cardiovascular;  Laterality: N/A;  . RIGHT/LEFT HEART CATH AND CORONARY ANGIOGRAPHY N/A 03/25/2019   Procedure: RIGHT/LEFT HEART CATH AND CORONARY ANGIOGRAPHY;  Surgeon: Martinique, Peter M, MD;  Location: [place] CV LAB;  Service: Cardiovascular;  Laterality: N/A;  . TEE WITHOUT CARDIOVERSION N/A 03/26/2019   Procedure: TRANSESOPHAGEAL ECHOCARDIOGRAM (TEE);  Surgeon: Josue Hector, MD;  Location: [facility] ENDOSCOPY;  Service: Cardiovascular;  Laterality: N/A;    Current Medications: Current Meds  Medication Sig  . apixaban (ELIQUIS) 5 MG TABS tablet Take 1 tablet (5 mg total) by mouth 2 (two) times daily.  Marland Kitchen atorvastatin (LIPITOR) 40 MG tablet Take 1 tablet (40 mg total) by mouth daily at 6 PM.  . furosemide (LASIX) 80 MG tablet Take 80 mg by mouth every other day.  . metoprolol succinate (TOPROL-XL) 100 MG 24 hr tablet Take 1 tablet (100 mg total) by mouth daily. Take with or immediately following a meal.  . Multiple Vitamin (MULTIVITAMIN) tablet Take 1 tablet by mouth daily.  . sacubitril-valsartan (ENTRESTO) 49-51 MG Take 1 tablet by mouth 2 (two) times daily.  Marland Kitchen spironolactone (ALDACTONE) 25 MG tablet Take 1 tablet (25 mg total) by mouth daily.  . [DISCONTINUED] amiodarone (PACERONE) 200 MG tablet Take 200 mg by mouth  daily.  . [DISCONTINUED] ENTRESTO 24-26 MG Take 1 tablet by mouth 2 (two) times daily.  . [DISCONTINUED] furosemide (LASIX) 80 MG tablet Take 1 tablet (80 mg total) by mouth daily.  . [DISCONTINUED] sacubitril-valsartan (ENTRESTO) 49-51 MG Take 1 tablet by mouth 2 (two) times daily.     Allergies:    Patient has no known allergies.   Social History: Social History   Socioeconomic History  . Marital status: Single    Spouse name: Not on file  . Number of children: Not on file  . Years of education: Not on file  . Highest education level: High school graduate  Occupational History   . Not on file  Social Needs  . Financial resource strain: Not hard at all  . Food insecurity    Worry: Never true    Inability: Never true  . Transportation needs    Medical: No    Non-medical: No  Tobacco Use  . Smoking status: Never Smoker  . Smokeless tobacco: Never Used  Substance and Sexual Activity  . Alcohol use: No  . Drug use: No  . Sexual activity: Not on file  Lifestyle  . Physical activity    Days per week: 0 days    Minutes per session: Not on file  . Stress: Not at all  Relationships  . Social Herbalist on phone: Not on file    Gets together: Not on file    Attends religious service: Not on file    Active member of club or organization: Not on file    Attends meetings of clubs or organizations: Not on file    Relationship status: Not on file  Other Topics Concern  . Not on file  Social History Narrative  . Not on file    Family History: The patient's family history includes High blood pressure in his mother; Kidney failure in his father.  ROS:   All other ROS reviewed and negative. Pertinent positives noted in the HPI.     EKGs/Labs/Other Studies Reviewed:   The following studies were personally reviewed by me today:  EKG:  EKG is ordered today.  The ekg ordered today demonstrates normal sinus rhythm, heart rate 64, PACs noted, no acute ST-T changes to suggest ischemia, no evidence of prior infarction, and was personally reviewed by me.   LHC [id number]  LV end diastolic pressure is moderately elevated.  Hemodynamic findings consistent with mild pulmonary hypertension.   1. Normal coronary anatomy 2. Moderately elevated LV filling pressures. PCWP and LVEDP of 23 mm Hg 3. Mild pulmonary venous HTN 4. Preserved cardiac output. Index 3.22.  TTE 03/22/2019 1. Left ventricular ejection fraction, by visual estimation, is <20%. The left ventricle has severely decreased function. There is no left ventricular hypertrophy.  2. Definity  contrast agent was given IV to delineate the left ventricular endocardial borders.  3. Moderately dilated left ventricular internal cavity size.  4. The left ventricle demonstrates global hypokinesis.  5. Left ventricular diastolic parameters are indeterminate.  6. LV thrombus excluded by echo contrast.  7. Global right ventricle has severely reduced systolic function.The right ventricular size is not well visualized. Right vetricular wall thickness was not assessed.  8. Left atrial size was normal.  9. Right atrial size was normal. 10. Mild to moderate aortic valve annular calcification. 11. The mitral valve is normal in structure. Mild to moderate mitral valve regurgitation. 12. The tricuspid valve is normal in structure. Tricuspid valve regurgitation mild-moderate. 13.  The aortic valve is tricuspid. Aortic valve regurgitation is not visualized. No evidence of aortic valve sclerosis or stenosis. 14. The pulmonic valve was not well visualized. Pulmonic valve regurgitation is not visualized. 15. Mildly elevated pulmonary artery systolic pressure. 16. The inferior vena cava is dilated in size with <50% respiratory variability, suggesting right atrial pressure of 15 mmHg.  TEE/DCCV 03/26/2019  1. Left ventricular ejection fraction, by visual estimation, is 25 to 30%. The left ventricle has severely decreased function. Moderately increased left ventricular size. There is no left ventricular hypertrophy.  2. Dilated cardiomyopathy.  3. Global right ventricle has normal systolic function.The right ventricular size is normal. No increase in right ventricular wall thickness.  4. Left atrial size was moderately dilated.  5. No LAA thrombus.  6. Right atrial size was moderately dilated.  7. The mitral valve is normal in structure. No evidence of mitral valve regurgitation. Mild mitral stenosis.  8. The tricuspid valve is normal in structure. Tricuspid valve regurgitation is mild.  9. The aortic valve  is normal in structure. Aortic valve regurgitation is not visualized. No evidence of aortic valve sclerosis or stenosis. 10. The pulmonic valve was normal in structure. Pulmonic valve regurgitation is not visualized. 11. The inferior vena cava is normal in size with greater than 50% respiratory variability, suggesting right atrial pressure of 3 mmHg. 12. DCC: x 3 200J biphasic each. Converted form afib rate 122 to NSR rate 91 bpm.   Recent Labs: 03/21/2019: B Natriuretic Peptide 261.6 03/22/2019: TSH 1.220 03/24/2019: ALT 15 03/27/2019: Hemoglobin 14.8; Magnesium 2.0; Platelets 276 03/31/2019: BUN 15; Creatinine, Ser 1.21; Potassium 4.9; Sodium 140   Recent Lipid Panel    Component Value Date/Time   CHOL 159 03/23/2019 1253   TRIG 105 03/23/2019 1253   HDL 32 (L) 03/23/2019 1253   CHOLHDL 5.0 03/23/2019 1253   VLDL 21 03/23/2019 1253   LDLCALC 106 (H) 03/23/2019 1253    Physical Exam:   VS:  BP (!) 139/91   Pulse (!) 59   Temp (!) 97.3 F (36.3 C)   Ht 5\' 11"  (1.803 m)   Wt (!) 319 lb 9.6 oz (145 kg)   BMI 44.58 kg/m    Wt Readings from Last 3 Encounters:  04/26/19 (!) 319 lb 9.6 oz (145 kg)  04/20/19 (!) 322 lb 12.8 oz (146.4 kg)  04/06/19 (!) 326 lb 4.5 oz (148 kg)    General: Obese male no acute distress Heart: Atraumatic, normal size  Eyes: PEERLA, EOMI  Neck: Supple, no JVD Endocrine: No thryomegaly Cardiac: Normal S1, S2; RRR; no murmurs, rubs, or gallops Lungs: Clear to auscultation bilaterally, no wheezing, rhonchi or rales  Abd: Soft, nontender, no hepatomegaly  Ext: Trace edema Musculoskeletal: No deformities, BUE and BLE strength normal and equal Skin: Warm and dry, no rashes   Neuro: Alert and oriented to person, place, time, and situation, CNII-XII grossly intact, no focal deficits  Psych: Normal mood and affect   ASSESSMENT:   Parker Calderon is a 58 y.o. male who presents for the following: 1. Chronic systolic heart failure ([place])   2. Essential  hypertension   3. Persistent atrial fibrillation ([facility])   4. Obesity, morbid, BMI 40.0-49.9 ([place])     PLAN:   1. Chronic systolic heart failure ([place]), NYHA class I/II, ACC/AHA stage C, ejection fraction 25 to 30%, nonischemic etiology -Recently admitted to the hospital with new onset systolic heart failure in the setting of atrial fibrillation RVR 03/2019.  -Normal  left heart cath.  Normal hemodynamics on right heart cath.  -He was in atrial fibrillation with RVR so I do question if this was the etiology of his heart failure.  Morbid obesity also a question. -Blood pressure log shows pressures are a bit stable.  Also a bit low at times.  I will have him come back in 2 weeks for further titration of his Entresto.  He is almost to goal of 97-103. -We will reduce his Lasix to 80 mg every other day.  I suspect we will further titrate this to as needed. -Heart rate 59 today at goal; Continue metoprolol succinate 100 mg daily.  -We will continue Aldactone 25 mg daily -We will plan to repeat an echocardiogram in 6 months after his initial diagnosis.  This will be May 2020.  I am hopeful he will have recovery of left ventricular function.  2. Essential hypertension -Manage as above  3. Persistent atrial fibrillation ([place]), CHADSVASC =2 -Underwent TEE cardioversion on 11/6.  He will complete his amiodarone 1 month therapy in a few days and stop this. -Continue Eliquis 5 mg twice daily.  I favor indefinite anticoagulation.  4. Obesity, morbid, BMI 40.0-49.9 ([place]) -Counseled on importance of weight loss and exercise  Disposition: Keep regular scheduled appointment in February  Medication Adjustments/Labs and Tests Ordered: Current medicines are reviewed at length with the patient today.  Concerns regarding medicines are outlined above.  Orders Placed This Encounter  Procedures  . EKG 12-Lead   Meds ordered this encounter  Medications  . sacubitril-valsartan (ENTRESTO) 49-51 MG    Sig: Take 1  tablet by mouth 2 (two) times daily.    Dispense:  60 tablet    Refill:  11    Patient Instructions  Medication Instructions:  Take lasix 80mg  every other day  Take entresto 49/51mg  twice daily - new prescription sent to pharmacy  Stop amiodarone when finished with pills you have at home Continue other current medications  *If you need a refill on your cardiac medications before your next appointment, please call your pharmacy*  Follow-Up: At [facility], you and your health needs are our priority.  As part of our continuing mission to provide you with exceptional heart care, we have created designated Provider Care Teams.  These Care Teams include your primary Cardiologist (physician) and Advanced Practice Providers (APPs -  Physician Assistants and Nurse Practitioners) who all work together to provide you with the care you need, when you need it.  Your next appointment:   As scheduled with Dr. Audie Box on February 11 @ 9am  Dr. Audie Box recommends that you schedule a follow-up appointment in [place] with clinical pharmacist for entresto dose titration       Signed, [place] T. Audie Box, [place]  [address], [place], [place] [address] [phone]  04/26/2019 9:58 AM

## 2019-04-26 ENCOUNTER — Ambulatory Visit (INDEPENDENT_AMBULATORY_CARE_PROVIDER_SITE_OTHER): Payer: BC Managed Care – PPO | Admitting: Cardiovascular Disease

## 2019-04-26 ENCOUNTER — Encounter: Payer: Self-pay | Admitting: Cardiovascular Disease

## 2019-04-26 ENCOUNTER — Other Ambulatory Visit: Payer: Self-pay

## 2019-04-26 VITALS — BP 139/91 | HR 59 | Temp 97.3°F | Ht 71.0 in | Wt 319.6 lb

## 2019-04-26 DIAGNOSIS — I4819 Other persistent atrial fibrillation: Secondary | ICD-10-CM | POA: Diagnosis not present

## 2019-04-26 DIAGNOSIS — I10 Essential (primary) hypertension: Secondary | ICD-10-CM

## 2019-04-26 DIAGNOSIS — I5022 Chronic systolic (congestive) heart failure: Secondary | ICD-10-CM

## 2019-04-26 MED ORDER — ENTRESTO 49-51 MG PO TABS: 1.0000 | ORAL_TABLET | Freq: Two times a day (BID) | ORAL | 11 refills | Status: DC

## 2019-04-26 NOTE — Patient Instructions (Signed)
Medication Instructions:  Take lasix 80mg  every other day  Take entresto 49/51mg  twice daily - new prescription sent to pharmacy  Stop amiodarone when finished with pills you have at home Continue other current medications  *If you need a refill on your cardiac medications before your next appointment, please call your pharmacy*  Follow-Up: At [facility], you and your health needs are our priority.  As part of our continuing mission to provide you with exceptional heart care, we have created designated Provider Care Teams.  These Care Teams include your primary Cardiologist (physician) and Advanced Practice Providers (APPs -  Physician Assistants and Nurse Practitioners) who all work together to provide you with the care you need, when you need it.  Your next appointment:   As scheduled with Dr. Audie Box on February 11 @ 9am  Dr. Audie Box recommends that you schedule a follow-up appointment in [place] with clinical pharmacist for entresto dose titration

## 2019-04-28 ENCOUNTER — Other Ambulatory Visit: Payer: Self-pay

## 2019-04-28 ENCOUNTER — Encounter (HOSPITAL_COMMUNITY)
Admission: RE | Admit: 2019-04-28 | Discharge: 2019-04-28 | Disposition: A | Discharge status: Home / Self Care | Payer: BC Managed Care – PPO | Source: Ambulatory Visit | Attending: Cardiovascular Disease | Admitting: Cardiovascular Disease

## 2019-04-28 DIAGNOSIS — I5022 Chronic systolic (congestive) heart failure: Secondary | ICD-10-CM

## 2019-04-29 NOTE — Progress Notes (Signed)
Cardiac Individual Treatment Plan  Patient Details  Name: Parker Calderon MRN: [id number] Date of Birth: 02-15-1961 Referring Provider:     CARDIAC REHAB PHASE II ORIENTATION from 04/06/2019 in [facility]  Referring Provider  O'Neal, Cassie Freer, MD      Initial Encounter Date:    CARDIAC REHAB PHASE II ORIENTATION from 04/06/2019 in [facility]  Date  04/06/19      Visit Diagnosis: Heart failure, chronic systolic ([place])  Patient's Home Medications on Admission:  Current Outpatient Medications:  .  apixaban (ELIQUIS) 5 MG TABS tablet, Take 1 tablet (5 mg total) by mouth 2 (two) times daily., Disp: 180 tablet, Rfl: 3 .  atorvastatin (LIPITOR) 40 MG tablet, Take 1 tablet (40 mg total) by mouth daily at 6 PM., Disp: 90 tablet, Rfl: 3 .  furosemide (LASIX) 80 MG tablet, Take 80 mg by mouth every other day., Disp: , Rfl:  .  metoprolol succinate (TOPROL-XL) 100 MG 24 hr tablet, Take 1 tablet (100 mg total) by mouth daily. Take with or immediately following a meal., Disp: 90 tablet, Rfl: 3 .  Multiple Vitamin (MULTIVITAMIN) tablet, Take 1 tablet by mouth daily., Disp: , Rfl:  .  sacubitril-valsartan (ENTRESTO) 49-51 MG, Take 1 tablet by mouth 2 (two) times daily., Disp: 60 tablet, Rfl: 11 .  spironolactone (ALDACTONE) 25 MG tablet, Take 1 tablet (25 mg total) by mouth daily., Disp: 90 tablet, Rfl: 3  Past Medical History: Past Medical History:  Diagnosis Date  . Arthritis    Rhinitis  . Atrial fibrillation ([place])   . CHF (congestive heart failure) ([place])   . Hyperlipidemia   . Hypertension   . Irritable bowel syndrome   . Left thigh pain    Lateral, aching  . Obesity, unspecified   . Varicose veins     Tobacco Use: Social History   Tobacco Use  Smoking Status Never Smoker  Smokeless Tobacco Never Used    Labs: Recent Review Flowsheet Data    Labs for ITP Cardiac and Pulmonary Rehab Latest Ref Rng & Units  03/22/2019 03/23/2019 03/25/2019 03/25/2019   Cholestrol 0 - 200 mg/dL 159 159 - -   LDLCALC 0 - 99 mg/dL 119(H) 106(H) - -   HDL >40 mg/dL 31(L) 32(L) - -   Trlycerides <150 mg/dL 44 105 - -   Hemoglobin A1c 4.8 - 5.6 % 6.1(H) - - -   PHART 7.350 - 7.450 - - - 7.479(H)   PCO2ART 32.0 - 48.0 mmHg - - - 41.2   HCO3 20.0 - 28.0 mmol/L - - 30.0(H) 30.6(H)   TCO2 22 - 32 mmol/L - - 31 32   O2SAT % - - 77.0 99.0      Capillary Blood Glucose: No results found for: GLUCAP   Exercise Target Goals: Exercise Program Goal: Individual exercise prescription set using results from initial 6 min walk test and THRR while considering  patient's activity barriers and safety.   Exercise Prescription Goal: Starting with aerobic activity 30 plus minutes a day, 3 days per week for initial exercise prescription. Provide home exercise prescription and guidelines that participant acknowledges understanding prior to discharge.  Activity Barriers & Risk Stratification: Activity Barriers & Cardiac Risk Stratification - 04/06/19 1207      Activity Barriers & Cardiac Risk Stratification   Activity Barriers  None    Cardiac Risk Stratification  High       6 Minute Walk: 6 Minute  Walk    Row Name 04/06/19 0947         6 Minute Walk   Phase  Initial     Distance  1400 feet     Walk Time  6 minutes     # of Rest Breaks  0     MPH  2.65     METS  2.61     RPE  11     Perceived Dyspnea   0     VO2 Peak  9.13     Symptoms  No     Resting HR  86 bpm     Resting BP  120/84     Resting Oxygen Saturation   98 %     Exercise Oxygen Saturation  during 6 min walk  96 %     Max Ex. HR  104 bpm     Max Ex. BP  148/76     2 Minute Post BP  132/82        Oxygen Initial Assessment:   Oxygen Re-Evaluation:   Oxygen Discharge (Final Oxygen Re-Evaluation):   Initial Exercise Prescription: Initial Exercise Prescription - 04/06/19 1200      Date of Initial Exercise RX and Referring Provider   Date   04/06/19    Referring Provider  O'Neal, Cassie Freer, MD    Expected Discharge Date  06/04/19      Treadmill   MPH  2    Grade  0    Minutes  15    METs  2.53      NuStep   Level  3    SPM  85    Minutes  15    METs  2.5      Prescription Details   Frequency (times per week)  3    Duration  Progress to 30 minutes of continuous aerobic without signs/symptoms of physical distress      Intensity   THRR 40-80% of Max Heartrate  64-129    Ratings of Perceived Exertion  11-13    Perceived Dyspnea  0-4      Progression   Progression  Continue to progress workloads to maintain intensity without signs/symptoms of physical distress.      Resistance Training   Training Prescription  Yes    Weight  5lbs    Reps  10-15       Perform Capillary Blood Glucose checks as needed.  Exercise Prescription Changes: Exercise Prescription Changes    Row Name 04/12/19 1400 04/23/19 1100           Response to Exercise   Blood Pressure (Admit)  124/68  128/60      Blood Pressure (Exercise)  158/70  110/62      Blood Pressure (Exit)  112/68  104/60      Heart Rate (Admit)  82 bpm  85 bpm      Heart Rate (Exercise)  111 bpm  104 bpm      Heart Rate (Exit)  79 bpm  76 bpm      Rating of Perceived Exertion (Exercise)  11  11      Symptoms  None  None      Comments  Pt first day of exercise.   -      Duration  Continue with 30 min of aerobic exercise without signs/symptoms of physical distress.  Continue with 30 min of aerobic exercise without signs/symptoms of physical distress.      Intensity  THRR unchanged  THRR unchanged        Progression   Progression  Continue to progress workloads to maintain intensity without signs/symptoms of physical distress.  Continue to progress workloads to maintain intensity without signs/symptoms of physical distress.      Average METs  2.3  2.6        Resistance Training   Training Prescription  Yes  Yes      Weight  5lbs  5lbs      Reps  10-15   10-15      Time  10 Minutes  10 Minutes        Treadmill   MPH  2  2.5      Grade  0  0      Minutes  15  15      METs  2.53  2.91        NuStep   Level  3  3      SPM  85  85      Minutes  15  15      METs  2.2  2.3         Exercise Comments: Exercise Comments    Row Name 04/12/19 1459 04/29/19 1330         Exercise Comments  Pt first day of exercise in CR program. Pt tolerated exercise well.  Pt is progressing well in the program. Pt continues to exercsie at home.         Exercise Goals and Review: Exercise Goals    Row Name 04/06/19 1008             Exercise Goals   Increase Physical Activity  Yes       Intervention  Provide advice, education, support and counseling about physical activity/exercise needs.;Develop an individualized exercise prescription for aerobic and resistive training based on initial evaluation findings, risk stratification, comorbidities and participant's personal goals.       Expected Outcomes  Short Term: Attend rehab on a regular basis to increase amount of physical activity.;Long Term: Exercising regularly at least 3-5 days a week.;Long Term: Add in home exercise to make exercise part of routine and to increase amount of physical activity.       Increase Strength and Stamina  Yes       Intervention  Provide advice, education, support and counseling about physical activity/exercise needs.;Develop an individualized exercise prescription for aerobic and resistive training based on initial evaluation findings, risk stratification, comorbidities and participant's personal goals.       Expected Outcomes  Short Term: Increase workloads from initial exercise prescription for resistance, speed, and METs.;Short Term: Perform resistance training exercises routinely during rehab and add in resistance training at home;Long Term: Improve cardiorespiratory fitness, muscular endurance and strength as measured by increased METs and functional capacity (6MWT)        Able to understand and use rate of perceived exertion (RPE) scale  Yes       Intervention  Provide education and explanation on how to use RPE scale       Expected Outcomes  Short Term: Able to use RPE daily in rehab to express subjective intensity level;Long Term:  Able to use RPE to guide intensity level when exercising independently       Knowledge and understanding of Target Heart Rate Range (THRR)  Yes       Intervention  Provide education and explanation of THRR including how the numbers were predicted and where  they are located for reference       Expected Outcomes  Short Term: Able to state/look up THRR;Long Term: Able to use THRR to govern intensity when exercising independently;Short Term: Able to use daily as guideline for intensity in rehab       Able to check pulse independently  Yes       Intervention  Provide education and demonstration on how to check pulse in carotid and radial arteries.;Review the importance of being able to check your own pulse for safety during independent exercise       Expected Outcomes  Short Term: Able to explain why pulse checking is important during independent exercise;Long Term: Able to check pulse independently and accurately       Understanding of Exercise Prescription  Yes       Intervention  Provide education, explanation, and written materials on patient's individual exercise prescription       Expected Outcomes  Short Term: Able to explain program exercise prescription;Long Term: Able to explain home exercise prescription to exercise independently          Exercise Goals Re-Evaluation : Exercise Goals Re-Evaluation    Row Name 04/12/19 1457 04/29/19 1327           Exercise Goal Re-Evaluation   Exercise Goals Review  Increase Physical Activity;Increase Strength and Stamina;Able to understand and use rate of perceived exertion (RPE) scale;Knowledge and understanding of Target Heart Rate Range (THRR);Understanding of Exercise Prescription   Increase Physical Activity;Increase Strength and Stamina;Able to understand and use rate of perceived exertion (RPE) scale;Knowledge and understanding of Target Heart Rate Range (THRR);Able to check pulse independently;Understanding of Exercise Prescription      Comments  Pt first day of exercise in CR program. Pt tolerated exercise Rx well. Pt understands THRR, RPE scale, and exercise Rx.  Pt is progressing well in the prorgram. Pt continues to increase workloads and MET levels. Pt MET level is 2.6. Pt is exercising at home by walking 2-4 days per week in addition to CR program.      Expected Outcomes  Will continue to monitor and progress Pt as tolerated.  Will continue to monitor and progress Pt as tolerated.          Discharge Exercise Prescription (Final Exercise Prescription Changes): Exercise Prescription Changes - 04/23/19 1100      Response to Exercise   Blood Pressure (Admit)  128/60    Blood Pressure (Exercise)  110/62    Blood Pressure (Exit)  104/60    Heart Rate (Admit)  85 bpm    Heart Rate (Exercise)  104 bpm    Heart Rate (Exit)  76 bpm    Rating of Perceived Exertion (Exercise)  11    Symptoms  None    Duration  Continue with 30 min of aerobic exercise without signs/symptoms of physical distress.    Intensity  THRR unchanged      Progression   Progression  Continue to progress workloads to maintain intensity without signs/symptoms of physical distress.    Average METs  2.6      Resistance Training   Training Prescription  Yes    Weight  5lbs    Reps  10-15    Time  10 Minutes      Treadmill   MPH  2.5    Grade  0    Minutes  15    METs  2.91      NuStep   Level  3  SPM  85    Minutes  15    METs  2.3       Nutrition:  Target Goals: Understanding of nutrition guidelines, daily intake of sodium <156m, cholesterol <2034m calories 30% from fat and 7% or less from saturated fats, daily to have 5 or more servings of fruits and  vegetables.  Biometrics: Pre Biometrics - 04/06/19 0913      Pre Biometrics   Height  _0  (1.753 m)    Waist Circumference  52.25 inches    Hip Circumference  56.5 inches    Waist to Hip Ratio  0.92 %    BMI (Calculated)  48.16    Triceps Skinfold  42 mm    % Body Fat  45 %    Grip Strength  42.5 kg    Flexibility  14.75 in    Single Leg Stand  2.25 seconds        Nutrition Therapy Plan and Nutrition Goals: Nutrition Therapy & Goals - 04/14/19 1054      Nutrition Therapy   Diet  Low Sodium, Carb modified    Drug/Food Interactions  Statins/Certain Fruits      Personal Nutrition Goals   Nutrition Goal  Pt to identify food quantities necessary to achieve weight loss of 6-24 lb at graduation from cardiac rehab.    Personal Goal #2  Pt able to name foods that affect blood glucose    Personal Goal #3  Pt to identify and limit food sources of saturated fat, trans fat, refined carbohydrates and sodium      Intervention Plan   Intervention  Prescribe, educate and counsel regarding individualized specific dietary modifications aiming towards targeted core components such as weight, hypertension, lipid management, diabetes, heart failure and other comorbidities.;Nutrition handout(s) given to patient.    Expected Outcomes  Short Term Goal: Understand basic principles of dietary content, such as calories, fat, sodium, cholesterol and nutrients.;Short Term Goal: A plan has been developed with personal nutrition goals set during dietitian appointment.;Long Term Goal: Adherence to prescribed nutrition plan.       Nutrition Assessments: Nutrition Assessments - 04/14/19 1057      MEDFICTS Scores   Pre Score  30       Nutrition Goals Re-Evaluation: Nutrition Goals Re-Evaluation    Row Name 04/14/19 1056             Goals   Current Weight  326 lb (147.9 kg)       Nutrition Goal  Pt to identify food quantities necessary to achieve weight loss of 6-24 lb at graduation from cardiac  rehab.         Personal Goal #2 Re-Evaluation   Personal Goal #2  Pt able to name foods that affect blood glucose         Personal Goal #3 Re-Evaluation   Personal Goal #3  Pt to identify and limit food sources of saturated fat, trans fat, refined carbohydrates and sodium          Nutrition Goals Discharge (Final Nutrition Goals Re-Evaluation): Nutrition Goals Re-Evaluation - 04/14/19 1056      Goals   Current Weight  326 lb (147.9 kg)    Nutrition Goal  Pt to identify food quantities necessary to achieve weight loss of 6-24 lb at graduation from cardiac rehab.      Personal Goal #2 Re-Evaluation   Personal Goal #2  Pt able to name foods that affect blood glucose  Personal Goal #3 Re-Evaluation   Personal Goal #3  Pt to identify and limit food sources of saturated fat, trans fat, refined carbohydrates and sodium       Psychosocial: Target Goals: Acknowledge presence or absence of significant depression and/or stress, maximize coping skills, provide positive support system. Participant is able to verbalize types and ability to use techniques and skills needed for reducing stress and depression.  Initial Review & Psychosocial Screening: Initial Psych Review & Screening - 04/06/19 1149      Initial Review   Current issues with  Current Stress Concerns    Source of Stress Concerns  Chronic Illness      Family Dynamics   Good Support System?  Yes   Erasmo has his wife and children for support   Comments  Ramiro has stress due to his newly diagnosed heart failure      Barriers   Psychosocial barriers to participate in program  The patient should benefit from training in stress management and relaxation.      Screening Interventions   Interventions  Encouraged to exercise       Quality of Life Scores: Quality of Life - 04/06/19 1215      Quality of Life   Select  Quality of Life      Quality of Life Scores   Health/Function Pre  23.8 %    Socioeconomic Pre  24.14 %     Psych/Spiritual Pre  24 %    Family Pre  25.9 %    GLOBAL Pre  24.22 %      Scores of 19 and below usually indicate a poorer quality of life in these areas.  A difference of  2-3 points is a clinically meaningful difference.  A difference of 2-3 points in the total score of the Quality of Life Index has been associated with significant improvement in overall quality of life, self-image, physical symptoms, and general health in studies assessing change in quality of life.  PHQ-9: Recent Review Flowsheet Data    Depression screen [facility] 2/9 04/06/2019   Decreased Interest 0   Down, Depressed, Hopeless 0   PHQ - 2 Score 0     Interpretation of Total Score  Total Score Depression Severity:  1-4 = Minimal depression, 5-9 = Mild depression, 10-14 = Moderate depression, 15-19 = Moderately severe depression, 20-27 = Severe depression   Psychosocial Evaluation and Intervention: Psychosocial Evaluation - 04/12/19 1553      Psychosocial Evaluation & Interventions   Interventions  Stress management education;Encouraged to exercise with the program and follow exercise prescription;Relaxation education    Comments  Judge expresses stress related to his newly diagnosed HF.  Devyn enjoys spending time with his family.    Expected Outcomes  Mehul will report decreased stress related to his newly diagnosed HF through improved education of the disease process.    Continue Psychosocial Services   Follow up required by staff       Psychosocial Re-Evaluation: Psychosocial Re-Evaluation    [place] Name 04/29/19 1522             Psychosocial Re-Evaluation   Current issues with  Current Stress Concerns       Comments  Kyion has not voiced experiencing any increased stressors. Will continue to monitor and offer support as needed       Interventions  Encouraged to attend Cardiac Rehabilitation for the exercise       Continue Psychosocial Services   No Follow up required  Initial Review   Source of  Stress Concerns  Chronic Illness          Psychosocial Discharge (Final Psychosocial Re-Evaluation): Psychosocial Re-Evaluation - 04/29/19 1522      Psychosocial Re-Evaluation   Current issues with  Current Stress Concerns    Comments  Yazid has not voiced experiencing any increased stressors. Will continue to monitor and offer support as needed    Interventions  Encouraged to attend Cardiac Rehabilitation for the exercise    Continue Psychosocial Services   No Follow up required      Initial Review   Source of Stress Concerns  Chronic Illness       Vocational Rehabilitation: Provide vocational rehab assistance to qualifying candidates.   Vocational Rehab Evaluation & Intervention: Vocational Rehab - 04/06/19 1154      Initial Vocational Rehab Evaluation & Intervention   Assessment shows need for Vocational Rehabilitation  No       Education: Education Goals: Education classes will be provided on a weekly basis, covering required topics. Participant will state understanding/return demonstration of topics presented.  Learning Barriers/Preferences: Learning Barriers/Preferences - 04/06/19 1244      Learning Barriers/Preferences   Learning Barriers  Sight   Wears glasses   Learning Preferences  Written Material;Pictoral;Video       Education Topics: Hypertension, Hypertension Reduction -Define heart disease and high blood pressure. Discus how high blood pressure affects the body and ways to reduce high blood pressure.   Exercise and Your Heart -Discuss why it is important to exercise, the FITT principles of exercise, normal and abnormal responses to exercise, and how to exercise safely.   Angina -Discuss definition of angina, causes of angina, treatment of angina, and how to decrease risk of having angina.   Cardiac Medications -Review what the following cardiac medications are used for, how they affect the body, and side effects that may occur when taking the  medications.  Medications include Aspirin, Beta blockers, calcium channel blockers, ACE Inhibitors, angiotensin receptor blockers, diuretics, digoxin, and antihyperlipidemics.   Congestive Heart Failure -Discuss the definition of CHF, how to live with CHF, the signs and symptoms of CHF, and how keep track of weight and sodium intake.   Heart Disease and Intimacy -Discus the effect sexual activity has on the heart, how changes occur during intimacy as we age, and safety during sexual activity.   Smoking Cessation / COPD -Discuss different methods to quit smoking, the health benefits of quitting smoking, and the definition of COPD.   Nutrition I: Fats -Discuss the types of cholesterol, what cholesterol does to the heart, and how cholesterol levels can be controlled.   Nutrition II: Labels -Discuss the different components of food labels and how to read food label   Heart Parts/Heart Disease and PAD -Discuss the anatomy of the heart, the pathway of blood circulation through the heart, and these are affected by heart disease.   Stress I: Signs and Symptoms -Discuss the causes of stress, how stress may lead to anxiety and depression, and ways to limit stress.   Stress II: Relaxation -Discuss different types of relaxation techniques to limit stress.   Warning Signs of Stroke / TIA -Discuss definition of a stroke, what the signs and symptoms are of a stroke, and how to identify when someone is having stroke.   Knowledge Questionnaire Score: Knowledge Questionnaire Score - 04/06/19 1216      Knowledge Questionnaire Score   Pre Score  20/24  Core Components/Risk Factors/Patient Goals at Admission: Personal Goals and Risk Factors at Admission - 04/06/19 1216      Core Components/Risk Factors/Patient Goals on Admission    Weight Management  Yes;Obesity    Intervention  Weight Management/Obesity: Establish reasonable short term and long term weight goals.;Obesity: Provide  education and appropriate resources to help participant work on and attain dietary goals.    Admit Weight  326 lb 4.5 oz (148 kg)    Goal Weight: Short Term  320 lb (145.2 kg)    Goal Weight: Long Term  226 lb (102.5 kg)    Expected Outcomes  Short Term: Continue to assess and modify interventions until short term weight is achieved;Long Term: Adherence to nutrition and physical activity/exercise program aimed toward attainment of established weight goal;Weight Loss: Understanding of general recommendations for a balanced deficit meal plan, which promotes 1-2 lb weight loss per week and includes a negative energy balance of [phone] kcal/d;Understanding of distribution of calorie intake throughout the day with the consumption of 4-5 meals/snacks    Heart Failure  Yes    Intervention  Provide a combined exercise and nutrition program that is supplemented with education, support and counseling about heart failure. Directed toward relieving symptoms such as shortness of breath, decreased exercise tolerance, and extremity edema.    Expected Outcomes  Improve functional capacity of life;Short term: Attendance in program 2-3 days a week with increased exercise capacity. Reported lower sodium intake. Reported increased fruit and vegetable intake. Reports medication compliance.;Short term: Daily weights obtained and reported for increase. Utilizing diuretic protocols set by physician.;Long term: Adoption of self-care skills and reduction of barriers for early signs and symptoms recognition and intervention leading to self-care maintenance.    Hypertension  Yes    Intervention  Provide education on lifestyle modifcations including regular physical activity/exercise, weight management, moderate sodium restriction and increased consumption of fresh fruit, vegetables, and low fat dairy, alcohol moderation, and smoking cessation.;Monitor prescription use compliance.    Expected Outcomes  Short Term: Continued  assessment and intervention until BP is < 140/72m HG in hypertensive participants. < 130/845mHG in hypertensive participants with diabetes, heart failure or chronic kidney disease.;Long Term: Maintenance of blood pressure at goal levels.    Lipids  Yes    Intervention  Provide education and support for participant on nutrition & aerobic/resistive exercise along with prescribed medications to achieve LDL <7019mHDL >70m68m  Expected Outcomes  Short Term: Participant states understanding of desired cholesterol values and is compliant with medications prescribed. Participant is following exercise prescription and nutrition guidelines.;Long Term: Cholesterol controlled with medications as prescribed, with individualized exercise RX and with personalized nutrition plan. Value goals: LDL < 70mg34mL > 40 mg.       Core Components/Risk Factors/Patient Goals Review:  Goals and Risk Factor Review    Row Name 04/12/19 1559 04/29/19 1526           Core Components/Risk Factors/Patient Goals Review   Personal Goals Review  Weight Management/Obesity;Lipids;Hypertension;Heart Failure  Weight Management/Obesity;Lipids;Hypertension;Heart Failure      Review  Pt with multiple CAD RFs eager to participate in CR exercise.  Cornelious Sagard like to lose weight.  Pt with multiple CAD RFs eager to participate in CR exercise.  Shiro Jaydbeen doing well with exercise. Sonny's vital signs have been stable. Oddis Robynbeen enthusiastic and motivated      Expected Outcomes  Avrian Yaden continue to participate in CR exercise, nutrition, and lifestyle modification opportunities.  Dominique will continue to participate in CR exercise, nutrition, and lifestyle modification opportunities.         Core Components/Risk Factors/Patient Goals at Discharge (Final Review):  Goals and Risk Factor Review - 04/29/19 1526      Core Components/Risk Factors/Patient Goals Review   Personal Goals Review  Weight  Management/Obesity;Lipids;Hypertension;Heart Failure    Review  Pt with multiple CAD RFs eager to participate in CR exercise.  Jacarius has been doing well with exercise. Tyrece's vital signs have been stable. Cas has been enthusiastic and motivated    Expected Outcomes  Mishon will continue to participate in CR exercise, nutrition, and lifestyle modification opportunities.       ITP Comments: ITP Comments    Row Name 04/06/19 0912 04/12/19 1537 04/29/19 1519       ITP Comments  Dr. Fransico Him Medical Director Cardiac Rehab [facility]  Greig started exercise today and tolerated it well.  30 Day ITP Review. Patient has good attendtion and participation in phase 2 cardiac rehab.        Comments: See ITP Review.Barnet Pall, RN,BSN 04/29/2019 3:34 PM

## 2019-04-30 ENCOUNTER — Other Ambulatory Visit: Payer: Self-pay

## 2019-04-30 ENCOUNTER — Encounter (HOSPITAL_COMMUNITY)
Admission: RE | Admit: 2019-04-30 | Discharge: 2019-04-30 | Disposition: A | Discharge status: Home / Self Care | Payer: BC Managed Care – PPO | Source: Ambulatory Visit | Attending: Cardiovascular Disease | Admitting: Cardiovascular Disease

## 2019-04-30 DIAGNOSIS — I5022 Chronic systolic (congestive) heart failure: Secondary | ICD-10-CM | POA: Diagnosis not present

## 2019-05-03 ENCOUNTER — Other Ambulatory Visit: Payer: Self-pay

## 2019-05-03 ENCOUNTER — Encounter (HOSPITAL_COMMUNITY)
Admission: RE | Admit: 2019-05-03 | Discharge: 2019-05-03 | Disposition: A | Discharge status: Home / Self Care | Payer: BC Managed Care – PPO | Source: Ambulatory Visit | Attending: Cardiovascular Disease | Admitting: Cardiovascular Disease

## 2019-05-03 DIAGNOSIS — I5022 Chronic systolic (congestive) heart failure: Secondary | ICD-10-CM | POA: Diagnosis not present

## 2019-05-03 NOTE — Progress Notes (Signed)
I have reviewed a Home Exercise Prescription with Parker Calderon . [name] is currently exercising at home.  The patient was advised to walk 5-7 days a week for 30-45 minutes.  Brodric and I discussed how to progress their exercise prescription.  The patient stated that their goals were to use a stationary bike 2-4 days a week for 30-45 minutes.  The patient stated that they understand the exercise prescription.  We reviewed exercise guidelines, target heart rate during exercise, RPE Scale, weather conditions, NTG use, endpoints for exercise, warmup and cool down.  Patient is encouraged to come to me with any questions. I will continue to follow up with the patient to assist them with progression and safety.    Deitra Mayo BS, ACSM CEP 05/03/2019 10:32 AM

## 2019-05-05 ENCOUNTER — Encounter (HOSPITAL_COMMUNITY)
Admission: RE | Admit: 2019-05-05 | Discharge: 2019-05-05 | Disposition: A | Discharge status: Home / Self Care | Payer: BC Managed Care – PPO | Source: Ambulatory Visit | Attending: Cardiovascular Disease | Admitting: Cardiovascular Disease

## 2019-05-05 ENCOUNTER — Other Ambulatory Visit: Payer: Self-pay

## 2019-05-05 DIAGNOSIS — I5022 Chronic systolic (congestive) heart failure: Secondary | ICD-10-CM

## 2019-05-07 ENCOUNTER — Other Ambulatory Visit: Payer: Self-pay

## 2019-05-07 ENCOUNTER — Encounter (HOSPITAL_COMMUNITY)
Admission: RE | Admit: 2019-05-07 | Discharge: 2019-05-07 | Disposition: A | Discharge status: Home / Self Care | Payer: BC Managed Care – PPO | Source: Ambulatory Visit | Attending: Cardiovascular Disease | Admitting: Cardiovascular Disease

## 2019-05-07 DIAGNOSIS — I5022 Chronic systolic (congestive) heart failure: Secondary | ICD-10-CM

## 2019-05-10 ENCOUNTER — Encounter (HOSPITAL_COMMUNITY)
Admission: RE | Admit: 2019-05-10 | Discharge: 2019-05-10 | Disposition: A | Discharge status: Home / Self Care | Payer: BC Managed Care – PPO | Source: Ambulatory Visit | Attending: Cardiovascular Disease | Admitting: Cardiovascular Disease

## 2019-05-10 ENCOUNTER — Other Ambulatory Visit: Payer: Self-pay

## 2019-05-10 DIAGNOSIS — I5022 Chronic systolic (congestive) heart failure: Secondary | ICD-10-CM

## 2019-05-11 ENCOUNTER — Ambulatory Visit (INDEPENDENT_AMBULATORY_CARE_PROVIDER_SITE_OTHER): Payer: BC Managed Care – PPO | Admitting: Pharmacist

## 2019-05-11 VITALS — BP 122/80 | HR 76

## 2019-05-11 DIAGNOSIS — I5020 Unspecified systolic (congestive) heart failure: Secondary | ICD-10-CM | POA: Diagnosis not present

## 2019-05-11 LAB — BASIC METABOLIC PANEL
BUN/Creatinine Ratio: 18 (ref 9–20)
BUN: 22 mg/dL (ref 6–24)
CO2: 24 mmol/L (ref 20–29)
Calcium: 9.8 mg/dL (ref 8.7–10.2)
Chloride: 99 mmol/L (ref 96–106)
Creatinine, Ser: 1.23 mg/dL (ref 0.76–1.27)
GFR calc Af Amer: 74 mL/min/{1.73_m2} (ref >59)
GFR calc non Af Amer: 64 mL/min/{1.73_m2} (ref >59)
Glucose: 93 mg/dL (ref 65–99)
Potassium: 4.1 mmol/L (ref 3.5–5.2)
Sodium: 137 mmol/L (ref 134–144)

## 2019-05-11 MED ORDER — SACUBITRIL-VALSARTAN 97-103 MG PO TABS: 1.0000 | ORAL_TABLET | Freq: Two times a day (BID) | ORAL | 11 refills | Status: DC

## 2019-05-11 NOTE — Progress Notes (Signed)
Patient ID: Parker Calderon                 DOB: 02-09-1961                      MRN: [id number]     HPI: Parker Calderon is a 58 y.o. male referred by Dr. Audie Box to PharmD clinic. PMH is significant for  hx of newly diagnosed systolic HF ([id number], normal LHC), persistent Afib s/p TEE/DCCV, morbidy obesity (BMI 46), HTN. At last visit with Dr. Audie Box patient reports he hardly has any lower extremity edema and he can exercise for 30 min without limitations. His furosemide was decreased from 80mg  daily to 80mg  every other day. Patient's home blood pressures were at goal, low at time as reported by Dr. Audie Box. Patient has been sent to PharmD clinic for titration of Entresto.  Patient presents today to clinic. He denies any dizziness, lightheadedness or shortness of breath. He has minimal swelling in the left ankle. He does check his weight daily and he ranges between 315-321lb. Denies a weight gain of 3 or more pounds overnight or 5lb in a week. Patient did not know that he was supposed to take furosemide 80mg  every other day, therefore he has still been taking daily.  He states that cardiac rehab is ending at the end of the month and going virtual. He has bought a slimcycle to use for exercise. He will go to lab for BMP after visit.   Current CHF meds: Entresto 49-51mg  twice a day, furosemide 80mg  daily, metoprolol succinate 100mg  daily, spironolactone 25mg  daily BP goal: <130/80  Family History: The patient's family history includes High blood pressure in his mother; Kidney failure in his father.  Social History: never smoked, no ETOH  Exercise: cardiac rehab, bought a slimcycle and plans to start exercising at home  Home BP readings: 108/68, 124/68, 127/71 (11/21), 121/71 (11/22), 124/67, 121/65 (11/23) HR 60-70's   Wt Readings from Last 3 Encounters:  04/26/19 (!) 319 lb 9.6 oz (145 kg)  04/20/19 (!) 322 lb 12.8 oz (146.4 kg)  04/06/19 (!) 326 lb 4.5 oz (148 kg)   BP Readings from Last 3  Encounters:  04/26/19 (!) 139/91  04/20/19 128/76  04/06/19 120/84   Pulse Readings from Last 3 Encounters:  04/26/19 (!) 59  04/20/19 68  03/31/19 85    Renal function: CrCl cannot be calculated (Patient's most recent lab result is older than the maximum 21 days allowed.).  Past Medical History:  Diagnosis Date  . Arthritis    Rhinitis  . Atrial fibrillation ([place])   . CHF (congestive heart failure) ([place])   . Hyperlipidemia   . Hypertension   . Irritable bowel syndrome   . Left thigh pain    Lateral, aching  . Obesity, unspecified   . Varicose veins     Current Outpatient Medications on File Prior to Visit  Medication Sig Dispense Refill  . apixaban (ELIQUIS) 5 MG TABS tablet Take 1 tablet (5 mg total) by mouth 2 (two) times daily. 180 tablet 3  . atorvastatin (LIPITOR) 40 MG tablet Take 1 tablet (40 mg total) by mouth daily at 6 PM. 90 tablet 3  . furosemide (LASIX) 80 MG tablet Take 80 mg by mouth every other day.    . metoprolol succinate (TOPROL-XL) 100 MG 24 hr tablet Take 1 tablet (100 mg total) by mouth daily. Take with or immediately following a meal. 90 tablet 3  .  Multiple Vitamin (MULTIVITAMIN) tablet Take 1 tablet by mouth daily.    . sacubitril-valsartan (ENTRESTO) 49-51 MG Take 1 tablet by mouth 2 (two) times daily. 60 tablet 11  . spironolactone (ALDACTONE) 25 MG tablet Take 1 tablet (25 mg total) by mouth daily. 90 tablet 3   No current facility-administered medications on file prior to visit.    No Known Allergies  There were no vitals taken for this visit.   Assessment/Plan:  1. Hypertension - Patients blood pressure is at goal of <130/80 in clinic and at home. He states that his blood pressure is rarely in the low 100's. Therefore will plan to increase Entresto to 97-103 as long as lab work is stable. Will call patient in the AM with lab results and finalize plans. Patient instructed that he may take 2 of the Entresto 49-51mg  tablets twice a day  until he runs out. Will continue metoprolol succinate 100mg  daily, spironolactone 25mg  daily. Patient instructed to take furosemide 80mg  every other day. He is to continue to monitor his weight daily. If he has a weight gain of 3lb overnight or 5lb in a week or notices increased swelling or shortness of breath he is to call Parker Calderon. Follow up in 2 weeks at the [facility] office with the PharmD clinic there per request of patient as it is closer to his house.    Thank you  Ramond Dial, Pharm.D, BCPS, CPP [place]  [id number], [place], [place] [address]  Phone: [phone]; Fax: [phone]

## 2019-05-11 NOTE — Patient Instructions (Addendum)
It was a pleasure to meet you today!  We will call you tomorrow with the result of your blood work. As long as everything looks good, we will plan to increase to Entresto 97-103mg  twice a day. You may take two of the 49-51mg  twice a day until you run out.  Start taking furosemide 80mg  every OTHER day. Please give [place] a call if your swelling worsens or you notice a weight gain or 3lb overnight or 5lb in a week.  Call us at [phone] with any questions or concerns.

## 2019-05-12 ENCOUNTER — Telehealth: Payer: Self-pay | Admitting: Pharmacist

## 2019-05-12 ENCOUNTER — Other Ambulatory Visit: Payer: Self-pay

## 2019-05-12 ENCOUNTER — Encounter (HOSPITAL_COMMUNITY)
Admission: RE | Admit: 2019-05-12 | Discharge: 2019-05-12 | Disposition: A | Discharge status: Home / Self Care | Payer: BC Managed Care – PPO | Source: Ambulatory Visit | Attending: Cardiovascular Disease | Admitting: Cardiovascular Disease

## 2019-05-12 DIAGNOSIS — I5022 Chronic systolic (congestive) heart failure: Secondary | ICD-10-CM

## 2019-05-12 NOTE — Telephone Encounter (Signed)
Pt called back to clinic and is aware to increase Entresto dose.

## 2019-05-12 NOTE — Progress Notes (Signed)
Pt is interested in participating in Virtual Cardiac and Pulmonary Rehab. Pt advised that Virtual Cardiac and Pulmonary Rehab is provided at no cost to the patient.  Checklist:  1. Pt has smart device  ie smartphone and/or ipad for downloading an app  Yes 2. Reliable internet/wifi service    Yes 3. Understands how to use their smartphone and navigate within an app.  Yes   Pt verbalized understanding and is in agreement.            Confirm Consent - In the setting of the current Covid19 crisis, you are scheduled for a phone visit with your Cardiac or Pulmonary team member.  Just as we do with many in-gym visits, in order for you to participate in this visit, we must obtain consent.  If you'd like, I can send this to your mychart (if signed up) or email for you to review.  Otherwise, I can obtain your verbal consent now.  By agreeing to a telephone visit, we'd like you to understand that the technology does not allow for your Cardiac or Pulmonary Rehab team member to perform a physical assessment, and thus may limit their ability to fully assess your ability to perform exercise programs. If your provider identifies any concerns that need to be evaluated in person, we will make arrangements to do so.  Finally, though the technology is pretty good, we cannot assure that it will always work on either your or our end and we cannot ensure that we have a secure connection.  Cardiac and Pulmonary Rehab Telehealth visits and "At Home" cardiac and pulmonary rehab are provided at no cost to you.        Are you willing to proceed?"        STAFF: Did the patient verbally acknowledge consent to telehealth visit? Document YES/NO here: Yes     Barnet Pall RN  Cardiac and Pulmonary Rehab Staff        Date 05/12/19    @ Time 1345

## 2019-05-12 NOTE — Telephone Encounter (Signed)
Called pt and left message. Will advise him that BMET is stable, plan to increase Entresto to 97-103mg  BID. Keep appt 1/7 at [facility] office for BP/HF follow up.

## 2019-05-17 ENCOUNTER — Encounter (HOSPITAL_COMMUNITY)
Admission: RE | Admit: 2019-05-17 | Discharge: 2019-05-17 | Disposition: A | Discharge status: Home / Self Care | Payer: BC Managed Care – PPO | Source: Ambulatory Visit | Attending: Cardiovascular Disease | Admitting: Cardiovascular Disease

## 2019-05-17 ENCOUNTER — Other Ambulatory Visit: Payer: Self-pay

## 2019-05-17 DIAGNOSIS — I5022 Chronic systolic (congestive) heart failure: Secondary | ICD-10-CM | POA: Diagnosis not present

## 2019-05-19 ENCOUNTER — Encounter (HOSPITAL_COMMUNITY): Payer: BC Managed Care – PPO

## 2019-05-19 ENCOUNTER — Telehealth (HOSPITAL_COMMUNITY): Payer: Self-pay | Admitting: Family Medicine

## 2019-05-24 ENCOUNTER — Encounter (HOSPITAL_COMMUNITY): Payer: BC Managed Care – PPO

## 2019-05-24 ENCOUNTER — Encounter (HOSPITAL_COMMUNITY): Payer: Self-pay | Admitting: *Deleted

## 2019-05-24 ENCOUNTER — Inpatient Hospital Stay (HOSPITAL_COMMUNITY)
Admission: RE | Admit: 2019-05-24 | Discharge: 2019-05-24 | Disposition: A | Discharge status: Home / Self Care | Payer: BC Managed Care – PPO | Source: Ambulatory Visit

## 2019-05-24 ENCOUNTER — Other Ambulatory Visit: Payer: Self-pay

## 2019-05-24 DIAGNOSIS — I5022 Chronic systolic (congestive) heart failure: Secondary | ICD-10-CM | POA: Insufficient documentation

## 2019-05-24 NOTE — Progress Notes (Signed)
Nutrition Note - VIRTUAL  Spoke with pt on the phone for virtual cardiac rehab nutritional counseling. Pt has been making changes to diet while he attended rehab and is continuing to make changes at home. His current weight is 317 lbs on home scale. He is exercising 3-4 days/week on his stationary bike. He and wife went to grocery store yesterday and used heart healthy list to buy fruits/veggies/whole grains. Pt verbalized knowledge of the mediterranean diet. Discussed pt goals for the next 6 weeks. Pt wants to lose weight by eating healthier and exercising. Pt is still drinking sugary beverages but has transitioned to some sugar free options. Goal for this week is to keep a drink log and drink <1 sugary beverage per day. Reviewed diet recall. Reinforced with email summary of goals for this week. Pt feels motivated to make changes. Will continue with weekly follow up.    Michaele Offer, MS, RDN, LDN

## 2019-05-24 NOTE — Progress Notes (Signed)
Cardiac Individual Treatment Plan  Patient Details  Name: LUKEN SHADOWENS MRN: [id number] Date of Birth: Sep 05, 1960 Referring Provider:     CARDIAC REHAB PHASE II ORIENTATION from 04/06/2019 in [facility]  Referring Provider  O'Neal, Cassie Freer, MD      Initial Encounter Date:    CARDIAC REHAB PHASE II ORIENTATION from 04/06/2019 in [place]  Date  04/06/19      Visit Diagnosis: Heart failure, chronic systolic ([place])  Patient's Home Medications on Admission:  Current Outpatient Medications:  .  apixaban (ELIQUIS) 5 MG TABS tablet, Take 1 tablet (5 mg total) by mouth 2 (two) times daily., Disp: 180 tablet, Rfl: 3 .  atorvastatin (LIPITOR) 40 MG tablet, Take 1 tablet (40 mg total) by mouth daily at 6 PM., Disp: 90 tablet, Rfl: 3 .  furosemide (LASIX) 80 MG tablet, Take 80 mg by mouth every other day. Has been taking every day, Disp: , Rfl:  .  metoprolol succinate (TOPROL-XL) 100 MG 24 hr tablet, Take 1 tablet (100 mg total) by mouth daily. Take with or immediately following a meal., Disp: 90 tablet, Rfl: 3 .  Multiple Vitamin (MULTIVITAMIN) tablet, Take 1 tablet by mouth daily., Disp: , Rfl:  .  sacubitril-valsartan (ENTRESTO) 97-103 MG, Take 1 tablet by mouth 2 (two) times daily., Disp: 60 tablet, Rfl: 11 .  spironolactone (ALDACTONE) 25 MG tablet, Take 1 tablet (25 mg total) by mouth daily., Disp: 90 tablet, Rfl: 3  Past Medical History: Past Medical History:  Diagnosis Date  . Arthritis    Rhinitis  . Atrial fibrillation ([place])   . CHF (congestive heart failure) ([place])   . Hyperlipidemia   . Hypertension   . Irritable bowel syndrome   . Left thigh pain    Lateral, aching  . Obesity, unspecified   . Varicose veins     Tobacco Use: Social History   Tobacco Use  Smoking Status Never Smoker  Smokeless Tobacco Never Used    Labs: Recent Review Flowsheet Data    Labs for ITP Cardiac and Pulmonary Rehab  Latest Ref Rng & Units 03/22/2019 03/23/2019 03/25/2019 03/25/2019   Cholestrol 0 - 200 mg/dL 159 159 - -   LDLCALC 0 - 99 mg/dL 119(H) 106(H) - -   HDL >40 mg/dL 31(L) 32(L) - -   Trlycerides <150 mg/dL 44 105 - -   Hemoglobin A1c 4.8 - 5.6 % 6.1(H) - - -   PHART 7.350 - 7.450 - - - 7.479(H)   PCO2ART 32.0 - 48.0 mmHg - - - 41.2   HCO3 20.0 - 28.0 mmol/L - - 30.0(H) 30.6(H)   TCO2 22 - 32 mmol/L - - 31 32   O2SAT % - - 77.0 99.0      Capillary Blood Glucose: No results found for: GLUCAP   Exercise Target Goals: Exercise Program Goal: Individual exercise prescription set using results from initial 6 min walk test and THRR while considering  patient's activity barriers and safety.   Exercise Prescription Goal: Starting with aerobic activity 30 plus minutes a day, 3 days per week for initial exercise prescription. Provide home exercise prescription and guidelines that participant acknowledges understanding prior to discharge.  Activity Barriers & Risk Stratification: Activity Barriers & Cardiac Risk Stratification - 04/06/19 1207      Activity Barriers & Cardiac Risk Stratification   Activity Barriers  None    Cardiac Risk Stratification  High  6 Minute Walk: 6 Minute Walk    Row Name 04/06/19 0947         6 Minute Walk   Phase  Initial     Distance  1400 feet     Walk Time  6 minutes     # of Rest Breaks  0     MPH  2.65     METS  2.61     RPE  11     Perceived Dyspnea   0     VO2 Peak  9.13     Symptoms  No     Resting HR  86 bpm     Resting BP  120/84     Resting Oxygen Saturation   98 %     Exercise Oxygen Saturation  during 6 min walk  96 %     Max Ex. HR  104 bpm     Max Ex. BP  148/76     2 Minute Post BP  132/82        Oxygen Initial Assessment:   Oxygen Re-Evaluation:   Oxygen Discharge (Final Oxygen Re-Evaluation):   Initial Exercise Prescription: Initial Exercise Prescription - 04/06/19 1200      Date of Initial Exercise RX and  Referring Provider   Date  04/06/19    Referring Provider  O'Neal, Cassie Freer, MD    Expected Discharge Date  06/04/19      Treadmill   MPH  2    Grade  0    Minutes  15    METs  2.53      NuStep   Level  3    SPM  85    Minutes  15    METs  2.5      Prescription Details   Frequency (times per week)  3    Duration  Progress to 30 minutes of continuous aerobic without signs/symptoms of physical distress      Intensity   THRR 40-80% of Max Heartrate  64-129    Ratings of Perceived Exertion  11-13    Perceived Dyspnea  0-4      Progression   Progression  Continue to progress workloads to maintain intensity without signs/symptoms of physical distress.      Resistance Training   Training Prescription  Yes    Weight  5lbs    Reps  10-15       Perform Capillary Blood Glucose checks as needed.  Exercise Prescription Changes: Exercise Prescription Changes    Row Name 04/12/19 1400 04/23/19 1100 05/03/19 1000 05/10/19 0915 05/17/19 0915     Response to Exercise   Blood Pressure (Admit)  124/68  128/60  122/72  104/72  120/76   Blood Pressure (Exercise)  158/70  110/62  118/70  118/70  124/70   Blood Pressure (Exit)  112/68  104/60  104/64  102/60  108/64   Heart Rate (Admit)  82 bpm  85 bpm  72 bpm  82 bpm  77 bpm   Heart Rate (Exercise)  111 bpm  104 bpm  107 bpm  102 bpm  121 bpm   Heart Rate (Exit)  79 bpm  76 bpm  79 bpm  78 bpm  82 bpm   Rating of Perceived Exertion (Exercise)  '11  11  7  9  12   ' Symptoms  None  None  None  None  None   Comments  Pt first day of exercise.   --  --  --  Pt last day for in person CR program. Transitioning to virtual CR program.    Duration  Continue with 30 min of aerobic exercise without signs/symptoms of physical distress.  Continue with 30 min of aerobic exercise without signs/symptoms of physical distress.  Continue with 30 min of aerobic exercise without signs/symptoms of physical distress.  Continue with 30 min of aerobic  exercise without signs/symptoms of physical distress.  Continue with 30 min of aerobic exercise without signs/symptoms of physical distress.   Intensity  THRR unchanged  THRR unchanged  THRR unchanged  THRR unchanged  THRR unchanged     Progression   Progression  Continue to progress workloads to maintain intensity without signs/symptoms of physical distress.  Continue to progress workloads to maintain intensity without signs/symptoms of physical distress.  Continue to progress workloads to maintain intensity without signs/symptoms of physical distress.  Continue to progress workloads to maintain intensity without signs/symptoms of physical distress.  Continue to progress workloads to maintain intensity without signs/symptoms of physical distress.   Average METs  2.3  2.6  3  2.9  3     Resistance Training   Training Prescription  Yes  Yes  Yes  Yes  Yes   Weight  5lbs  5lbs  5lbs  5lbs  5lbs   Reps  10-15  10-15  10-15  10-15  10-15   Time  10 Minutes  10 Minutes  10 Minutes  10 Minutes  10 Minutes     Interval Training   Interval Training  --  --  --  No  No     Treadmill   MPH  2  2.5  2.9  2.9  2.9   Grade  0  0  '1  1  1   ' Minutes  '15  15  15  15  15   ' METs  2.53  2.91  3.62  3.62  3.62     NuStep   Level  '3  3  3  4  4   ' SPM  85  85  85  85  85   Minutes  '15  15  15  15  15   ' METs  2.2  2.3  2.4  2.3  2.4     Home Exercise Plan   Plans to continue exercise at  --  --  Home (comment)  Home (comment)  Home (comment)   Frequency  --  --  Add 3 additional days to program exercise sessions.  Add 3 additional days to program exercise sessions.  Add 3 additional days to program exercise sessions.   Initial Home Exercises Provided  --  --  05/03/19  05/03/19  05/03/19      Exercise Comments: Exercise Comments    Row Name 04/12/19 1459 04/29/19 1330 05/03/19 1038 05/20/19 1323     Exercise Comments  Pt first day of exercise in CR program. Pt tolerated exercise well.  Pt is  progressing well in the program. Pt continues to exercsie at home.  Reviewed HEP with Pt. Pt understands home exercise goals.  Pt is progressing well. 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 Pt will start logging his exercise on the app.       Exercise Goals and Review: Exercise Goals    Row Name 04/06/19 1008             Exercise Goals   Increase Physical Activity  Yes  Intervention  Provide advice, education, support and counseling about physical activity/exercise needs.;Develop an individualized exercise prescription for aerobic and resistive training based on initial evaluation findings, risk stratification, comorbidities and participant's personal goals.       Expected Outcomes  Short Term: Attend rehab on a regular basis to increase amount of physical activity.;Long Term: Exercising regularly at least 3-5 days a week.;Long Term: Add in home exercise to make exercise part of routine and to increase amount of physical activity.       Increase Strength and Stamina  Yes       Intervention  Provide advice, education, support and counseling about physical activity/exercise needs.;Develop an individualized exercise prescription for aerobic and resistive training based on initial evaluation findings, risk stratification, comorbidities and participant's personal goals.       Expected Outcomes  Short Term: Increase workloads from initial exercise prescription for resistance, speed, and METs.;Short Term: Perform resistance training exercises routinely during rehab and add in resistance training at home;Long Term: Improve cardiorespiratory fitness, muscular endurance and strength as measured by increased METs and functional capacity (6MWT)       Able to understand and use rate of perceived exertion (RPE) scale  Yes       Intervention  Provide education and explanation on how to use RPE scale       Expected Outcomes  Short Term: Able to use RPE daily in rehab to  express subjective intensity level;Long Term:  Able to use RPE to guide intensity level when exercising independently       Knowledge and understanding of Target Heart Rate Range (THRR)  Yes       Intervention  Provide education and explanation of THRR including how the numbers were predicted and where they are located for reference       Expected Outcomes  Short Term: Able to state/look up THRR;Long Term: Able to use THRR to govern intensity when exercising independently;Short Term: Able to use daily as guideline for intensity in rehab       Able to check pulse independently  Yes       Intervention  Provide education and demonstration on how to check pulse in carotid and radial arteries.;Review the importance of being able to check your own pulse for safety during independent exercise       Expected Outcomes  Short Term: Able to explain why pulse checking is important during independent exercise;Long Term: Able to check pulse independently and accurately       Understanding of Exercise Prescription  Yes       Intervention  Provide education, explanation, and written materials on patient's individual exercise prescription       Expected Outcomes  Short Term: Able to explain program exercise prescription;Long Term: Able to explain home exercise prescription to exercise independently          Exercise Goals Re-Evaluation : Exercise Goals Re-Evaluation    Row Name 04/12/19 1457 04/29/19 1327 05/03/19 1034 05/20/19 1320       Exercise Goal Re-Evaluation   Exercise Goals Review  Increase Physical Activity;Increase Strength and Stamina;Able to understand and use rate of perceived exertion (RPE) scale;Knowledge and understanding of Target Heart Rate Range (THRR);Understanding of Exercise Prescription  Increase Physical Activity;Increase Strength and Stamina;Able to understand and use rate of perceived exertion (RPE) scale;Knowledge and understanding of Target Heart Rate Range (THRR);Able to check pulse  independently;Understanding of Exercise Prescription  Increase Physical Activity;Increase Strength and Stamina;Able to understand and use rate of perceived  exertion (RPE) scale;Knowledge and understanding of Target Heart Rate Range (THRR);Able to check pulse independently;Understanding of Exercise Prescription  Increase Physical Activity;Increase Strength and Stamina;Able to understand and use rate of perceived exertion (RPE) scale;Knowledge and understanding of Target Heart Rate Range (THRR);Able to check pulse independently;Understanding of Exercise Prescription    Comments  Pt first day of exercise in CR program. Pt tolerated exercise Rx well. Pt understands THRR, RPE scale, and exercise Rx.  Pt is progressing well in the prorgram. Pt continues to increase workloads and MET levels. Pt MET level is 2.6. Pt is exercising at home by walking 2-4 days per week in addition to CR program.  Reviewed HEP with Pt. Pt understands exercise goals, RPE scale, THRR, end points of exercise, and weather precatuions. Pt stated he has purchased a stationary bike that he will begin to use. Pt stated he would use the bike 2-4 days per week for 30-45 minutes in addition to CR program.  Pt is progressing well in the program and has a MET level of 3.0. Pt completed 13 exercise sessions. Pt was offered the virutal CR program due to department clousre for [place]. Pt wants to participate in virtual CR program and is active in the app. He will start logging his exercise sessions on the app. Pt was encourgaed to exercise 5-7 days per week for 30-45 minutes per session.    Expected Outcomes  Will continue to monitor and progress Pt as tolerated.  Will continue to monitor and progress Pt as tolerated.  Will continue to monitor and progress Pt as tolerated.  Pt will continue to exercise at home.        Discharge Exercise Prescription (Final Exercise Prescription Changes): Exercise Prescription Changes - 05/17/19 0915      Response to  Exercise   Blood Pressure (Admit)  120/76    Blood Pressure (Exercise)  124/70    Blood Pressure (Exit)  108/64    Heart Rate (Admit)  77 bpm    Heart Rate (Exercise)  121 bpm    Heart Rate (Exit)  82 bpm    Rating of Perceived Exertion (Exercise)  12    Symptoms  None    Comments  Pt last day for in person CR program. Transitioning to virtual CR program.     Duration  Continue with 30 min of aerobic exercise without signs/symptoms of physical distress.    Intensity  THRR unchanged      Progression   Progression  Continue to progress workloads to maintain intensity without signs/symptoms of physical distress.    Average METs  3      Resistance Training   Training Prescription  Yes    Weight  5lbs    Reps  10-15    Time  10 Minutes      Interval Training   Interval Training  No      Treadmill   MPH  2.9    Grade  1    Minutes  15    METs  3.62      NuStep   Level  4    SPM  85    Minutes  15    METs  2.4      Home Exercise Plan   Plans to continue exercise at  Home (comment)    Frequency  Add 3 additional days to program exercise sessions.    Initial Home Exercises Provided  05/03/19       Nutrition:  Target Goals: Understanding  of nutrition guidelines, daily intake of sodium <1556m, cholesterol <2012m calories 30% from fat and 7% or less from saturated fats, daily to have 5 or more servings of fruits and vegetables.  Biometrics: Pre Biometrics - 04/06/19 0913      Pre Biometrics   Height  '5\' 9"'  (1.753 m)    Waist Circumference  52.25 inches    Hip Circumference  56.5 inches    Waist to Hip Ratio  0.92 %    BMI (Calculated)  48.16    Triceps Skinfold  42 mm    % Body Fat  45 %    Grip Strength  42.5 kg    Flexibility  14.75 in    Single Leg Stand  2.25 seconds        Nutrition Therapy Plan and Nutrition Goals: Nutrition Therapy & Goals - 04/14/19 1054      Nutrition Therapy   Diet  Low Sodium, Carb modified    Drug/Food Interactions   Statins/Certain Fruits      Personal Nutrition Goals   Nutrition Goal  Pt to identify food quantities necessary to achieve weight loss of 6-24 lb at graduation from cardiac rehab.    Personal Goal #2  Pt able to name foods that affect blood glucose    Personal Goal #3  Pt to identify and limit food sources of saturated fat, trans fat, refined carbohydrates and sodium      Intervention Plan   Intervention  Prescribe, educate and counsel regarding individualized specific dietary modifications aiming towards targeted core components such as weight, hypertension, lipid management, diabetes, heart failure and other comorbidities.;Nutrition handout(s) given to patient.    Expected Outcomes  Short Term Goal: Understand basic principles of dietary content, such as calories, fat, sodium, cholesterol and nutrients.;Short Term Goal: A plan has been developed with personal nutrition goals set during dietitian appointment.;Long Term Goal: Adherence to prescribed nutrition plan.       Nutrition Assessments: Nutrition Assessments - 04/14/19 1057      MEDFICTS Scores   Pre Score  30       Nutrition Goals Re-Evaluation: Nutrition Goals Re-Evaluation    RoMcKenzieame 04/14/19 1056 05/18/19 1503           Goals   Current Weight  326 lb (147.9 kg)  321 lb 3.4 oz (145.7 kg)      Nutrition Goal  Pt to identify food quantities necessary to achieve weight loss of 6-24 lb at graduation from cardiac rehab.  Pt to identify food quantities necessary to achieve weight loss of 6-24 lb at graduation from cardiac rehab.      Expected Outcome  --  Ongoing - Pt down 5 lbs since start of rehab.        Personal Goal #2 Re-Evaluation   Personal Goal #2  Pt able to name foods that affect blood glucose  Pt able to name foods that affect blood glucose        Personal Goal #3 Re-Evaluation   Personal Goal #3  Pt to identify and limit food sources of saturated fat, trans fat, refined carbohydrates and sodium  Pt to identify  and limit food sources of saturated fat, trans fat, refined carbohydrates and sodium         Nutrition Goals Discharge (Final Nutrition Goals Re-Evaluation): Nutrition Goals Re-Evaluation - 05/18/19 1503      Goals   Current Weight  321 lb 3.4 oz (145.7 kg)    Nutrition Goal  Pt to  identify food quantities necessary to achieve weight loss of 6-24 lb at graduation from cardiac rehab.    Expected Outcome  Ongoing - Pt down 5 lbs since start of rehab.      Personal Goal #2 Re-Evaluation   Personal Goal #2  Pt able to name foods that affect blood glucose      Personal Goal #3 Re-Evaluation   Personal Goal #3  Pt to identify and limit food sources of saturated fat, trans fat, refined carbohydrates and sodium       Psychosocial: Target Goals: Acknowledge presence or absence of significant depression and/or stress, maximize coping skills, provide positive support system. Participant is able to verbalize types and ability to use techniques and skills needed for reducing stress and depression.  Initial Review & Psychosocial Screening: Initial Psych Review & Screening - 04/06/19 1149      Initial Review   Current issues with  Current Stress Concerns    Source of Stress Concerns  Chronic Illness      Family Dynamics   Good Support System?  Yes   Calyx has his wife and children for support   Comments  Diontay has stress due to his newly diagnosed heart failure      Barriers   Psychosocial barriers to participate in program  The patient should benefit from training in stress management and relaxation.      Screening Interventions   Interventions  Encouraged to exercise       Quality of Life Scores: Quality of Life - 04/06/19 1215      Quality of Life   Select  Quality of Life      Quality of Life Scores   Health/Function Pre  23.8 %    Socioeconomic Pre  24.14 %    Psych/Spiritual Pre  24 %    Family Pre  25.9 %    GLOBAL Pre  24.22 %      Scores of 19 and below usually  indicate a poorer quality of life in these areas.  A difference of  2-3 points is a clinically meaningful difference.  A difference of 2-3 points in the total score of the Quality of Life Index has been associated with significant improvement in overall quality of life, self-image, physical symptoms, and general health in studies assessing change in quality of life.  PHQ-9: Recent Review Flowsheet Data    Depression screen [facility] 2/9 04/06/2019   Decreased Interest 0   Down, Depressed, Hopeless 0   PHQ - 2 Score 0     Interpretation of Total Score  Total Score Depression Severity:  1-4 = Minimal depression, 5-9 = Mild depression, 10-14 = Moderate depression, 15-19 = Moderately severe depression, 20-27 = Severe depression   Psychosocial Evaluation and Intervention: Psychosocial Evaluation - 04/12/19 1553      Psychosocial Evaluation & Interventions   Interventions  Stress management education;Encouraged to exercise with the program and follow exercise prescription;Relaxation education    Comments  Elvert expresses stress related to his newly diagnosed HF.  Ilai enjoys spending time with his family.    Expected Outcomes  Doak will report decreased stress related to his newly diagnosed HF through improved education of the disease process.    Continue Psychosocial Services   Follow up required by staff       Psychosocial Re-Evaluation: Psychosocial Re-Evaluation    Miller Name 04/29/19 1522 05/13/19 1027           Psychosocial Re-Evaluation   Current issues  with  Current Stress Concerns  Current Stress Concerns      Comments  Ja has not voiced experiencing any increased stressors. Will continue to monitor and offer support as needed  Robben has not voiced experiencing any increased stressors. Will continue to monitor and offer support as needed      Interventions  Encouraged to attend Cardiac Rehabilitation for the exercise  Encouraged to attend Cardiac Rehabilitation for the exercise       Continue Psychosocial Services   No Follow up required  No Follow up required        Initial Review   Source of Stress Concerns  Chronic Illness  Chronic Illness         Psychosocial Discharge (Final Psychosocial Re-Evaluation): Psychosocial Re-Evaluation - 05/13/19 1027      Psychosocial Re-Evaluation   Current issues with  Current Stress Concerns    Comments  Dajohn has not voiced experiencing any increased stressors. Will continue to monitor and offer support as needed    Interventions  Encouraged to attend Cardiac Rehabilitation for the exercise    Continue Psychosocial Services   No Follow up required      Initial Review   Source of Stress Concerns  Chronic Illness       Vocational Rehabilitation: Provide vocational rehab assistance to qualifying candidates.   Vocational Rehab Evaluation & Intervention: Vocational Rehab - 04/06/19 1154      Initial Vocational Rehab Evaluation & Intervention   Assessment shows need for Vocational Rehabilitation  No       Education: Education Goals: Education classes will be provided on a weekly basis, covering required topics. Participant will state understanding/return demonstration of topics presented.  Learning Barriers/Preferences: Learning Barriers/Preferences - 04/06/19 1244      Learning Barriers/Preferences   Learning Barriers  Sight   Wears glasses   Learning Preferences  Written Material;Pictoral;Video       Education Topics: Hypertension, Hypertension Reduction -Define heart disease and high blood pressure. Discus how high blood pressure affects the body and ways to reduce high blood pressure.   Exercise and Your Heart -Discuss why it is important to exercise, the FITT principles of exercise, normal and abnormal responses to exercise, and how to exercise safely.   Angina -Discuss definition of angina, causes of angina, treatment of angina, and how to decrease risk of having angina.   Cardiac Medications -Review  what the following cardiac medications are used for, how they affect the body, and side effects that may occur when taking the medications.  Medications include Aspirin, Beta blockers, calcium channel blockers, ACE Inhibitors, angiotensin receptor blockers, diuretics, digoxin, and antihyperlipidemics.   Congestive Heart Failure -Discuss the definition of CHF, how to live with CHF, the signs and symptoms of CHF, and how keep track of weight and sodium intake.   Heart Disease and Intimacy -Discus the effect sexual activity has on the heart, how changes occur during intimacy as we age, and safety during sexual activity.   Smoking Cessation / COPD -Discuss different methods to quit smoking, the health benefits of quitting smoking, and the definition of COPD.   Nutrition I: Fats -Discuss the types of cholesterol, what cholesterol does to the heart, and how cholesterol levels can be controlled.   Nutrition II: Labels -Discuss the different components of food labels and how to read food label   Heart Parts/Heart Disease and PAD -Discuss the anatomy of the heart, the pathway of blood circulation through the heart, and these are affected by  heart disease.   Stress I: Signs and Symptoms -Discuss the causes of stress, how stress may lead to anxiety and depression, and ways to limit stress.   Stress II: Relaxation -Discuss different types of relaxation techniques to limit stress.   Warning Signs of Stroke / TIA -Discuss definition of a stroke, what the signs and symptoms are of a stroke, and how to identify when someone is having stroke.   Knowledge Questionnaire Score: Knowledge Questionnaire Score - 04/06/19 1216      Knowledge Questionnaire Score   Pre Score  20/24       Core Components/Risk Factors/Patient Goals at Admission: Personal Goals and Risk Factors at Admission - 04/06/19 1216      Core Components/Risk Factors/Patient Goals on Admission    Weight Management   Yes;Obesity    Intervention  Weight Management/Obesity: Establish reasonable short term and long term weight goals.;Obesity: Provide education and appropriate resources to help participant work on and attain dietary goals.    Admit Weight  326 lb 4.5 oz (148 kg)    Goal Weight: Short Term  320 lb (145.2 kg)    Goal Weight: Long Term  226 lb (102.5 kg)    Expected Outcomes  Short Term: Continue to assess and modify interventions until short term weight is achieved;Long Term: Adherence to nutrition and physical activity/exercise program aimed toward attainment of established weight goal;Weight Loss: Understanding of general recommendations for a balanced deficit meal plan, which promotes 1-2 lb weight loss per week and includes a negative energy balance of [phone] kcal/d;Understanding of distribution of calorie intake throughout the day with the consumption of 4-5 meals/snacks    Heart Failure  Yes    Intervention  Provide a combined exercise and nutrition program that is supplemented with education, support and counseling about heart failure. Directed toward relieving symptoms such as shortness of breath, decreased exercise tolerance, and extremity edema.    Expected Outcomes  Improve functional capacity of life;Short term: Attendance in program 2-3 days a week with increased exercise capacity. Reported lower sodium intake. Reported increased fruit and vegetable intake. Reports medication compliance.;Short term: Daily weights obtained and reported for increase. Utilizing diuretic protocols set by physician.;Long term: Adoption of self-care skills and reduction of barriers for early signs and symptoms recognition and intervention leading to self-care maintenance.    Hypertension  Yes    Intervention  Provide education on lifestyle modifcations including regular physical activity/exercise, weight management, moderate sodium restriction and increased consumption of fresh fruit, vegetables, and low fat dairy,  alcohol moderation, and smoking cessation.;Monitor prescription use compliance.    Expected Outcomes  Short Term: Continued assessment and intervention until BP is < 140/62m HG in hypertensive participants. < 130/825mHG in hypertensive participants with diabetes, heart failure or chronic kidney disease.;Long Term: Maintenance of blood pressure at goal levels.    Lipids  Yes    Intervention  Provide education and support for participant on nutrition & aerobic/resistive exercise along with prescribed medications to achieve LDL <709mHDL >51m44m  Expected Outcomes  Short Term: Participant states understanding of desired cholesterol values and is compliant with medications prescribed. Participant is following exercise prescription and nutrition guidelines.;Long Term: Cholesterol controlled with medications as prescribed, with individualized exercise RX and with personalized nutrition plan. Value goals: LDL < 70mg57mL > 40 mg.       Core Components/Risk Factors/Patient Goals Review:  Goals and Risk Factor Review    Row Name 04/12/19 1559 04/29/19 1526 05/13/19 1027 05/24/19  1400       Core Components/Risk Factors/Patient Goals Review   Personal Goals Review  Weight Management/Obesity;Lipids;Hypertension;Heart Failure  Weight Management/Obesity;Lipids;Hypertension;Heart Failure  Weight Management/Obesity;Lipids;Hypertension;Heart Failure  Weight Management/Obesity;Lipids;Hypertension;Heart Failure    Review  Pt with multiple CAD RFs eager to participate in CR exercise.  Laron would like to lose weight.  Pt with multiple CAD RFs eager to participate in CR exercise.  Dhairya has been doing well with exercise. Jaquell's vital signs have been stable. Samar has been enthusiastic and motivated  Pt with multiple CAD RFs eager to participate in CR exercise.  Tijuan has been doing well with exercise. Silverio's vital signs have been stable. Gardiner has been enthusiastic and motivated  Pt with multiple CAD RFs eager to  participate in CR exercise.  Kalvin has been doing well with exercise. Josearmando's vital signs have been stable. Yoni has been enthusiastic and motivated    Expected Outcomes  Robben will continue to participate in CR exercise, nutrition, and lifestyle modification opportunities.  Dyshon will continue to participate in CR exercise, nutrition, and lifestyle modification opportunities.  Quention will continue to participate in CR exercise, nutrition, and lifestyle modification opportunities.Enrigue plans to transition to virtual cardiac rehab when in person cardiac rehab pauses due to the COVID 19 pandemic  Tranell is currently exercising at home and is using the virtual cardiac rehab as phase 2 cardiac rehab is on hold due to the [place] 19 Pandemic.       Core Components/Risk Factors/Patient Goals at Discharge (Final Review):  Goals and Risk Factor Review - 05/24/19 1400      Core Components/Risk Factors/Patient Goals Review   Personal Goals Review  Weight Management/Obesity;Lipids;Hypertension;Heart Failure    Review  Pt with multiple CAD RFs eager to participate in CR exercise.  Maliik has been doing well with exercise. Skylan's vital signs have been stable. Lancer has been enthusiastic and motivated    Expected Outcomes  Andreas is currently exercising at home and is using the virtual cardiac rehab as phase 2 cardiac rehab is on hold due to the [place] 19 Pandemic.       ITP Comments: ITP Comments    Row Name 04/06/19 0912 04/12/19 1537 04/29/19 1519 05/24/19 1352     ITP Comments  Dr. Fransico Him Medical Director Cardiac Rehab [facility]  Ronn started exercise today and tolerated it well.  30 Day ITP Review. Patient has good attendtion and participation in phase 2 cardiac rehab.  30 Day ITP Review. Montrel was doing well with exercise and had lost 3 kg since starting the porgram. Phase 2 cardiac rehab is currently on hold due to [place]. Jaison has transitioned to the virtual cardiac rehab APP.       Comments: See ITP  comments.Barnet Pall, RN,BSN 05/24/2019 2:04 PM

## 2019-05-26 ENCOUNTER — Encounter (HOSPITAL_COMMUNITY): Payer: BC Managed Care – PPO

## 2019-05-27 ENCOUNTER — Ambulatory Visit: Payer: BC Managed Care – PPO

## 2019-05-28 ENCOUNTER — Encounter (HOSPITAL_COMMUNITY): Payer: BC Managed Care – PPO

## 2019-05-31 ENCOUNTER — Encounter (HOSPITAL_COMMUNITY): Payer: BC Managed Care – PPO

## 2019-05-31 ENCOUNTER — Encounter (HOSPITAL_COMMUNITY)
Admission: RE | Admit: 2019-05-31 | Discharge: 2019-05-31 | Disposition: A | Discharge status: Home / Self Care | Payer: BC Managed Care – PPO | Source: Ambulatory Visit | Attending: Internal Medicine | Admitting: Internal Medicine

## 2019-05-31 ENCOUNTER — Other Ambulatory Visit: Payer: Self-pay

## 2019-05-31 NOTE — Progress Notes (Signed)
Nutrition Note: Virtual Visit  Week 2 Spoke with pt for virtual cardiac rehab. Pt is exercising at home about 5-6 days/week for 30 minutes/session. Pt is reporting exercise in virtual app. Pt denies adverse exercise related symptoms.   Diet recall reviewed. Pt denies any problems with medications.  Nutrition goals reviewed. Pt met last week goal of <1 sugary beverage per day. He drank 2 sugary beverages over the week. Activity: Continue eating heart healthy snacks. Pt has been choosing fruit, peanut butter, and protein bars instead of potato chips. Handouts: snacks Reinforced education and goals with summary email.  Will continue to monitor pt with weekly phone calls for virtual cardiac rehab.   Michaele Offer, MS, RDN, LDN

## 2019-06-02 ENCOUNTER — Encounter (HOSPITAL_COMMUNITY): Payer: BC Managed Care – PPO

## 2019-06-04 ENCOUNTER — Telehealth (HOSPITAL_COMMUNITY): Payer: Self-pay | Admitting: *Deleted

## 2019-06-04 ENCOUNTER — Encounter (HOSPITAL_COMMUNITY): Payer: BC Managed Care – PPO

## 2019-06-04 ENCOUNTER — Encounter (HOSPITAL_COMMUNITY)
Admission: RE | Admit: 2019-06-04 | Discharge: 2019-06-04 | Disposition: A | Discharge status: Home / Self Care | Payer: BC Managed Care – PPO | Source: Ambulatory Visit | Attending: Cardiovascular Disease | Admitting: Cardiovascular Disease

## 2019-06-04 NOTE — Progress Notes (Signed)
Virtual Visit Cardiac Rehab  Spoke with patient regarding his home exercise routine. Patient states his doing pretty well with his exercise at home. His goal is to lose weight and right now he's maintaining his weight. Patient is riding his stationary bike 30 minutes 2 days/week without any problems. Discussed increasing exercise duration from 30 to 40 minutes and/or increasing the number of days/week to at lest 4-5 days/week to help achieve his weight loss goals. Patient works 3rd shift and also helps with transportation for his son and daughter, so increasing duration is hard for him with his schedule and being able to get adequate rest. Patient will focus on adding days/week to his routine. I advised patient that there are some education al videos on the app that he can view regarding exercise and other topics related to risk factor modification. Patient verbalizes understanding of information given.  Sol Passer, [place], ACSM Juanito Doom 06/04/2019 [phone]

## 2019-06-07 ENCOUNTER — Telehealth (HOSPITAL_COMMUNITY): Payer: Self-pay | Admitting: Family Medicine

## 2019-06-07 ENCOUNTER — Encounter (HOSPITAL_COMMUNITY): Payer: BC Managed Care – PPO

## 2019-06-08 ENCOUNTER — Other Ambulatory Visit: Payer: Self-pay

## 2019-06-08 ENCOUNTER — Encounter (HOSPITAL_COMMUNITY)
Admission: RE | Admit: 2019-06-08 | Discharge: 2019-06-08 | Disposition: A | Discharge status: Home / Self Care | Payer: BC Managed Care – PPO | Source: Ambulatory Visit | Attending: Cardiovascular Disease | Admitting: Cardiovascular Disease

## 2019-06-08 NOTE — Progress Notes (Signed)
Nutrition Note: Virtual Visit  Spoke with pt for virtual cardiac rehab. Pt is exercising at home about 5 days/week for 9minutes/session. Pt is reporting exercise in virtual app.   Diet recall reviewed. Pt denies any problems with medications.  Nutrition goals reviewed. Goals unmet. Pt continues goal to limit sugary beverages and drink more water. Recommended a drink log, getting 6-8 hours sleep daily, pay attention to snacks surrounding soda intake. Pt amenable to these goals. He verbalizes goal of weight loss. His weight has maintained around 217 lbs since early December.  Activity: keep a drink log, refused food log. Handouts: label reading Reinforced education and goals with summary email.  Will continue to monitor pt with weekly phone calls for virtual cardiac rehab.   Michaele Offer, MS, RDN, LDN

## 2019-06-10 ENCOUNTER — Other Ambulatory Visit: Payer: Self-pay

## 2019-06-10 ENCOUNTER — Ambulatory Visit (INDEPENDENT_AMBULATORY_CARE_PROVIDER_SITE_OTHER): Payer: BC Managed Care – PPO | Admitting: Pharmacist

## 2019-06-10 VITALS — BP 132/78 | HR 62 | Resp 15 | Ht 71.0 in | Wt 321.8 lb

## 2019-06-10 DIAGNOSIS — I5020 Unspecified systolic (congestive) heart failure: Secondary | ICD-10-CM

## 2019-06-10 LAB — BASIC METABOLIC PANEL
BUN/Creatinine Ratio: 15 (ref 9–20)
BUN: 15 mg/dL (ref 6–24)
CO2: 24 mmol/L (ref 20–29)
Calcium: 9.1 mg/dL (ref 8.7–10.2)
Chloride: 101 mmol/L (ref 96–106)
Creatinine, Ser: 1.01 mg/dL (ref 0.76–1.27)
GFR calc Af Amer: 94 mL/min/{1.73_m2} (ref >59)
GFR calc non Af Amer: 81 mL/min/{1.73_m2} (ref >59)
Glucose: 95 mg/dL (ref 65–99)
Potassium: 4.2 mmol/L (ref 3.5–5.2)
Sodium: 139 mmol/L (ref 134–144)

## 2019-06-10 MED ORDER — APIXABAN 5 MG PO TABS: 5.0000 mg | ORAL_TABLET | Freq: Two times a day (BID) | ORAL | 3 refills | Status: DC

## 2019-06-10 NOTE — Patient Instructions (Addendum)
Return for a  follow up appointment in 8 weeks  Go to the lab in  [place] your blood pressure at home daily (if able) and keep record of the readings.  Contact weigh management program if needed: [facility] [phone] [place].  [place], [place] [address]   Take your BP meds as follows: *NO MEDICATION CHANGES*   Bring all of your meds, your BP cuff and your record of home blood pressures to your next appointment.  Exercise as you're able, try to walk approximately 30 minutes per day.  Keep salt intake to a minimum, especially watch canned and prepared boxed foods.  Eat more fresh fruits and vegetables and fewer canned items.  Avoid eating in fast food restaurants.    HOW TO TAKE YOUR BLOOD PRESSURE: . Rest 5 minutes before taking your blood pressure. .  Don't smoke or drink caffeinated beverages for at least 30 minutes before. . Take your blood pressure before (not after) you eat. . Sit comfortably with your back supported and both feet on the floor (don't cross your legs). . Elevate your arm to heart level on a table or a desk. . Use the proper sized cuff. It should fit smoothly and snugly around your bare upper arm. There should be enough room to slip a fingertip under the cuff. The bottom edge of the cuff should be 1 inch above the crease of the elbow. . Ideally, take 3 measurements at one sitting and record the average.     Heart Failure Action Plan A heart failure action plan helps you understand what to do when you have symptoms of heart failure. Follow the plan that was created by you and your health care provider. Review your plan each time you visit your health care provider.  These signs and symptoms mean you should get medical help right away:  You have trouble breathing when resting.  You have a dry cough that is getting worse.  You have swelling or pain in your legs or abdomen that is getting worse.  You suddenly gain more than 2-3  lb (0.9-1.4 kg) in a day, or more than 5 lb (2.3 kg) in one week. This amount may be more or less depending on your condition.  You have trouble staying awake or you feel confused.  You have chest pain.  You do not have an appetite.  You pass out.  You have a dry cough.

## 2019-06-10 NOTE — Progress Notes (Signed)
Patient ID: Parker Calderon                 DOB: 1960-07-14                      MRN: [id number]     HPI: Parker Calderon is a 59 y.o. male referred by Dr. Audie Box to pharmacist clinic for medication titration. PMH includes HFrEF (E 25-30% on 03/26/2019), persistent Afib, hypertension, hyperlipidemia,and obesity.  During last OV on 05/11/2019 his Delene Loll was increased from 49/52mg  to 97/103mg  twice daily.  Patient reports feeling well and with more energy. Admits to multiple indiscretion on his diet by eating [place] sandwicg at [place], pizza last week, and cheese steak sandwiches and few other things. Denies increase shortness of breath, swelling or bloating. Noted weight is up 2lb from last office visit but no indication of volume overload.  Current HTN meds:  Entresto 97-103mg  twice daily Furosemide 80mg  every other day Metoprolol succinate 100mg  daily Spironolactone 25mg  daily  BP goal: <130/80  Family History: The patient'sfamily history includes High blood pressure in his mother; Kidney failure in his father.  Social History: denies tobacco or alcohol use  Diet: less sodium using no-sodium salt, drinks soda (2 regular and 2 diet per day), orange juice 8onz every other, occasional snicker, home cooked meal, cheese steak subs, subway [place] sandwich  Exercise: cardiac rehab (completed), bought a slimcycle  30 minutes 4x per week; 6lb weight  Home BP readings:  Wrist cuff accurate within 58mmHg  14 readings; average 124/64 (range [id number] systolic and [id number] diastolic); HR range [id number]   Wt Readings from Last 3 Encounters:  06/10/19 (!) 321 lb 12.8 oz (146 kg)  04/26/19 (!) 319 lb 9.6 oz (145 kg)  04/20/19 (!) 322 lb 12.8 oz (146.4 kg)   BP Readings from Last 3 Encounters:  06/10/19 132/78  05/11/19 122/80  04/26/19 (!) 139/91   Pulse Readings from Last 3 Encounters:  06/10/19 62  05/11/19 76  04/26/19 (!) 59    Past Medical History:  Diagnosis Date  . Arthritis    Rhinitis  . Atrial fibrillation ([place])   . CHF (congestive heart failure) ([place])   . Hyperlipidemia   . Hypertension   . Irritable bowel syndrome   . Left thigh pain    Lateral, aching  . Obesity, unspecified   . Varicose veins     Current Outpatient Medications on File Prior to Visit  Medication Sig Dispense Refill  . atorvastatin (LIPITOR) 40 MG tablet Take 1 tablet (40 mg total) by mouth daily at 6 PM. 90 tablet 3  . furosemide (LASIX) 80 MG tablet Take 80 mg by mouth every other day. Has been taking every day    . metoprolol succinate (TOPROL-XL) 100 MG 24 hr tablet Take 1 tablet (100 mg total) by mouth daily. Take with or immediately following a meal. 90 tablet 3  . Multiple Vitamin (MULTIVITAMIN) tablet Take 1 tablet by mouth daily.    . sacubitril-valsartan (ENTRESTO) 97-103 MG Take 1 tablet by mouth 2 (two) times daily. 60 tablet 11  . spironolactone (ALDACTONE) 25 MG tablet Take 1 tablet (25 mg total) by mouth daily. 90 tablet 3   No current facility-administered medications on file prior to visit.    No Known Allergies  Blood pressure 132/78, pulse 62, resp. rate 15, height 5\' 11"  (1.803 m), weight (!) 321 lb 12.8 oz (146 kg), SpO2 97 %.  HFrEF (heart failure with reduced ejection  fraction) ([place]) Blood pressure and renal function remains appropriate for current therapy. Delene Loll is at maximum daily dose and patient is tolerating therapy very well. Spironolactone and metoprolol also on board. Furosemide dose was decreased to 80mg  every other day and patient denied need to additional doses. His BP today is slightly higher than his home BP average but in agreement with previous BP reported almost 2 weeks ago.    Most time was spent talking about low sodium choices and potential benefit to start weight management program. Patient continues to eat significant about of sodium in take out meals unwillingly. Also noted weight gain not related to volume overload.   Will continue all  medication without changes, repeat BMET today (4 wks after last Entresto titration), continue to work with low sodium diet (education material provided), consider weight management program, and follow up with Dr Audie Box in 4 week and pharmacist clinic in 8 weeks.  Plan to    Parker Calderon PharmD, BCPS, [place] [address] [place] [id number] 06/13/2019 6:57 PM

## 2019-06-13 NOTE — Assessment & Plan Note (Signed)
Blood pressure and renal function remains appropriate for current therapy. Parker Calderon is at maximum daily dose and patient is tolerating therapy very well. Spironolactone and metoprolol also on board. Furosemide dose was decreased to 80mg  every other day and patient denied need to additional doses. His BP today is slightly higher than his home BP average but in agreement with previous BP reported almost 2 weeks ago.    Most time was spent talking about low sodium choices and potential benefit to start weight management program. Patient continues to eat significant about of sodium in take out meals unwillingly. Also noted weight gain not related to volume overload.   Will continue all medication without changes, repeat BMET today (4 wks after last Entresto titration), continue to work with low sodium diet (education material provided), consider weight management program, and follow up with Dr Audie Box in 4 week and pharmacist clinic in 8 weeks.  Plan to

## 2019-06-18 ENCOUNTER — Encounter (HOSPITAL_COMMUNITY)
Admission: RE | Admit: 2019-06-18 | Discharge: 2019-06-18 | Disposition: A | Discharge status: Home / Self Care | Payer: BC Managed Care – PPO | Source: Ambulatory Visit | Attending: Cardiovascular Disease | Admitting: Cardiovascular Disease

## 2019-06-18 DIAGNOSIS — I5022 Chronic systolic (congestive) heart failure: Secondary | ICD-10-CM | POA: Insufficient documentation

## 2019-06-18 NOTE — Progress Notes (Signed)
Cardiac Rehab: Virtual Visit  Patient called today to ask about his blood pressure and heart rate readings. I reviewed patient's BP and HR readings logged in the virtual cardiac rehab app, they are within normal limits, and patient has not been symptomatic. Patient verbalizes understanding. Patient is doing well with his exercise routine at home, riding his stationary bike and using 6 lb weights for his resistance exercises. Patient would like to transition back to the onsite cardiac rehab program to finish his time once exercise classes resume. Patient asked about some dental work he has upcoming. Patient has an appointment with Dr. Audie Box on 07/01/2019, and I advised him to discuss the questions he has with the cardiologist at his office visit.  Sol Passer, MS, ACSM CEP

## 2019-06-21 ENCOUNTER — Other Ambulatory Visit: Payer: Self-pay

## 2019-06-21 ENCOUNTER — Telehealth (HOSPITAL_COMMUNITY): Payer: Self-pay | Admitting: *Deleted

## 2019-06-21 ENCOUNTER — Encounter (HOSPITAL_COMMUNITY)
Admission: RE | Admit: 2019-06-21 | Discharge: 2019-06-21 | Disposition: A | Discharge status: Home / Self Care | Payer: BC Managed Care – PPO | Source: Ambulatory Visit | Attending: Internal Medicine | Admitting: Internal Medicine

## 2019-06-21 DIAGNOSIS — I5022 Chronic systolic (congestive) heart failure: Secondary | ICD-10-CM | POA: Insufficient documentation

## 2019-06-21 NOTE — Telephone Encounter (Signed)
Spoke to [name] this morning. Parker Calderon is having difficulty using the better hearts APP. Resent the link. [name] says that he will bring his smart phone in one day this week to trouble shoot.Barnet Pall, RN,BSN 06/21/2019 9:14 AM

## 2019-06-21 NOTE — Progress Notes (Signed)
Nutrition Note: Virtual Visit  Spoke with pt for virtual cardiac rehab. Pt is  exercising at home. Pt is reporting exercise in virtual app.   Diet recall reviewed. Nutrition goals reviewed. Chandler has continued to cut back on sugary beverages due to A1C of 6.0%. This week he reports cutting back on starches with dinner. He is choosing yogurts, fruits, peanut butter, for snacks. He is incorporating some veggies with evening meal. Goal this week to incorporate veggies in snacks or more at dinner. Activity: more veggies, find creative and enjoyable ways to incorporate veggies Will continue to monitor pt with phone calls for virtual cardiac rehab until we return to in person rehab.   Michaele Offer, MS, RDN, LDN

## 2019-06-23 ENCOUNTER — Encounter (HOSPITAL_COMMUNITY): Payer: Self-pay | Admitting: *Deleted

## 2019-06-23 ENCOUNTER — Other Ambulatory Visit: Payer: Self-pay

## 2019-06-23 DIAGNOSIS — I5022 Chronic systolic (congestive) heart failure: Secondary | ICD-10-CM

## 2019-06-23 NOTE — Progress Notes (Signed)
Cardiac Individual Treatment Plan  Patient Details  Name: Parker Calderon MRN: [id number] Date of Birth: 1960/09/04 Referring Provider:     CARDIAC REHAB PHASE II ORIENTATION from 04/06/2019 in [facility]  Referring Provider  O'Neal, Cassie Freer, MD      Initial Encounter Date:    CARDIAC REHAB PHASE II ORIENTATION from 04/06/2019 in [facility]  Date  04/06/19      Visit Diagnosis: Heart failure, chronic systolic ([place])  Patient's Home Medications on Admission:  Current Outpatient Medications:  .  amiodarone (PACERONE) 200 MG tablet, Take 1 tablet by mouth twice a day for 2 weeks then reduce to 1 tablet daily, Disp: 60 tablet, Rfl: 0 .  apixaban (ELIQUIS) 5 MG TABS tablet, Take 1 tablet (5 mg total) by mouth 2 (two) times daily., Disp: 180 tablet, Rfl: 3 .  atorvastatin (LIPITOR) 40 MG tablet, Take 1 tablet (40 mg total) by mouth daily at 6 PM., Disp: 90 tablet, Rfl: 3 .  chlorhexidine (PERIDEX) 0.12 % solution, SMARTSIG:1 Capful(s) By Mouth 3 Times Daily, Disp: , Rfl:  .  furosemide (LASIX) 80 MG tablet, Take 80 mg by mouth every other day. , Disp: , Rfl:  .  metoprolol succinate (TOPROL-XL) 100 MG 24 hr tablet, Take 1 tablet (100 mg total) by mouth daily. Take with or immediately following a meal., Disp: 90 tablet, Rfl: 3 .  Multiple Vitamin (MULTIVITAMIN) tablet, Take 1 tablet by mouth daily., Disp: , Rfl:  .  sacubitril-valsartan (ENTRESTO) 97-103 MG, Take 1 tablet by mouth 2 (two) times daily., Disp: 60 tablet, Rfl: 11 .  spironolactone (ALDACTONE) 25 MG tablet, Take 1 tablet (25 mg total) by mouth daily., Disp: 90 tablet, Rfl: 3  Past Medical History: Past Medical History:  Diagnosis Date  . Arthritis    Rhinitis  . Atrial fibrillation ([place])   . CHF (congestive heart failure) ([place])   . Hyperlipidemia   . Hypertension   . Irritable bowel syndrome   . Left thigh pain    Lateral, aching  . Obesity,  unspecified   . Varicose veins     Tobacco Use: Social History   Tobacco Use  Smoking Status Never Smoker  Smokeless Tobacco Never Used    Labs: Recent Review Flowsheet Data    Labs for ITP Cardiac and Pulmonary Rehab Latest Ref Rng & Units 03/22/2019 03/23/2019 03/25/2019 03/25/2019   Cholestrol 0 - 200 mg/dL 159 159 - -   LDLCALC 0 - 99 mg/dL 119(H) 106(H) - -   HDL >40 mg/dL 31(L) 32(L) - -   Trlycerides <150 mg/dL 44 105 - -   Hemoglobin A1c 4.8 - 5.6 % 6.1(H) - - -   PHART 7.350 - 7.450 - - - 7.479(H)   PCO2ART 32.0 - 48.0 mmHg - - - 41.2   HCO3 20.0 - 28.0 mmol/L - - 30.0(H) 30.6(H)   TCO2 22 - 32 mmol/L - - 31 32   O2SAT % - - 77.0 99.0      Capillary Blood Glucose: No results found for: GLUCAP   Exercise Target Goals: Exercise Program Goal: Individual exercise prescription set using results from initial 6 min walk test and THRR while considering  patient's activity barriers and safety.   Exercise Prescription Goal: Starting with aerobic activity 30 plus minutes a day, 3 days per week for initial exercise prescription. Provide home exercise prescription and guidelines that participant acknowledges understanding prior to discharge.  Activity Barriers &  Risk Stratification:   6 Minute Walk:   Oxygen Initial Assessment:   Oxygen Re-Evaluation:   Oxygen Discharge (Final Oxygen Re-Evaluation):   Initial Exercise Prescription:   Perform Capillary Blood Glucose checks as needed.  Exercise Prescription Changes:  Exercise Prescription Changes    Row Name 05/03/19 1000 05/10/19 0915 05/17/19 0915 06/16/19 1117       Response to Exercise   Blood Pressure (Admit)  122/72  104/72  120/76  107/55    Blood Pressure (Exercise)  118/70  118/70  124/70  104/42    Blood Pressure (Exit)  104/64  102/60  108/64  --    Heart Rate (Admit)  72 bpm  82 bpm  77 bpm  65 bpm    Heart Rate (Exercise)  107 bpm  102 bpm  121 bpm  89 bpm    Heart Rate (Exit)  79 bpm  78 bpm   82 bpm  --    Rating of Perceived Exertion (Exercise)  _0 Symptoms  None  None  None  None    Comments  --  --  Pt last day for in person CR program. Transitioning to virtual CR program.   Virtual cardiac rehab session.    Duration  Continue with 30 min of aerobic exercise without signs/symptoms of physical distress.  Continue with 30 min of aerobic exercise without signs/symptoms of physical distress.  Continue with 30 min of aerobic exercise without signs/symptoms of physical distress.  Continue with 30 min of aerobic exercise without signs/symptoms of physical distress.    Intensity  THRR unchanged  THRR unchanged  THRR unchanged  THRR unchanged      Progression   Progression  Continue to progress workloads to maintain intensity without signs/symptoms of physical distress.  Continue to progress workloads to maintain intensity without signs/symptoms of physical distress.  Continue to progress workloads to maintain intensity without signs/symptoms of physical distress.  Continue to progress workloads to maintain intensity without signs/symptoms of physical distress.    Average METs  3  2.9  3  --      Resistance Training   Training Prescription  Yes  Yes  Yes  Yes    Weight  5lbs  5lbs  5lbs  6lbs    Reps  10-15  10-15  10-15  10-15    Time  10 Minutes  10 Minutes  10 Minutes  10 Minutes      Interval Training   Interval Training  --  No  No  No      Treadmill   MPH  2.9  2.9  2.9  --    Grade  _1 --    Minutes  _2 --    METs  3.62  3.62  3.62  --      Bike   Minutes  --  --  --  30      NuStep   Level  _3 --    SPM  85  85  85  --    Minutes  _4 --    METs  2.4  2.3  2.4  --      Home Exercise Plan   Plans to continue exercise at  Home (comment)  Home (comment)  Home (comment)  Home (comment)    Frequency  Add 3 additional  days to program exercise sessions.  Add 3 additional days to program exercise sessions.  Add 3 additional days to  program exercise sessions.  Add 3 additional days to program exercise sessions.    Initial Home Exercises Provided  05/03/19  05/03/19  05/03/19  05/03/19       Exercise Comments:  Exercise Comments    Row Name 04/29/19 1330 05/03/19 1038 05/20/19 1323 06/28/19 1116     Exercise Comments  Pt is progressing well in the program. Pt continues to exercsie at home.  Reviewed HEP with Pt. Pt understands home exercise goals.  Pt is progressing well. 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 Pt will start logging his exercise on the app.  Patient plans to continue exercise in the virtual cardiac rehab program.       Exercise Goals and Review:   Exercise Goals Re-Evaluation : Exercise Goals Re-Evaluation    Row Name 04/29/19 1327 05/03/19 1034 05/20/19 1320 06/28/19 1113       Exercise Goal Re-Evaluation   Exercise Goals Review  Increase Physical Activity;Increase Strength and Stamina;Able to understand and use rate of perceived exertion (RPE) scale;Knowledge and understanding of Target Heart Rate Range (THRR);Able to check pulse independently;Understanding of Exercise Prescription  Increase Physical Activity;Increase Strength and Stamina;Able to understand and use rate of perceived exertion (RPE) scale;Knowledge and understanding of Target Heart Rate Range (THRR);Able to check pulse independently;Understanding of Exercise Prescription  Increase Physical Activity;Increase Strength and Stamina;Able to understand and use rate of perceived exertion (RPE) scale;Knowledge and understanding of Target Heart Rate Range (THRR);Able to check pulse independently;Understanding of Exercise Prescription  Increase Physical Activity;Increase Strength and Stamina;Able to understand and use rate of perceived exertion (RPE) scale;Knowledge and understanding of Target Heart Rate Range (THRR);Able to check pulse independently;Understanding of Exercise Prescription    Comments  Pt is progressing  well in the prorgram. Pt continues to increase workloads and MET levels. Pt MET level is 2.6. Pt is exercising at home by walking 2-4 days per week in addition to CR program.  Reviewed HEP with Pt. Pt understands exercise goals, RPE scale, THRR, end points of exercise, and weather precatuions. Pt stated he has purchased a stationary bike that he will begin to use. Pt stated he would use the bike 2-4 days per week for 30-45 minutes in addition to CR program.  Pt is progressing well in the program and has a MET level of 3.0. Pt completed 13 exercise sessions. Pt was offered the virutal CR program due to department clousre for [place]. Pt wants to participate in virtual CR program and is active in the app. He will start logging his exercise sessions on the app. Pt was encourgaed to exercise 5-7 days per week for 30-45 minutes per session.  Patient has been actively participating in the virtual cardiac rehab program via the [employer] app and has been progressing well. Patient is riding his stationary bike 30 minutes, 3-4 days/week and using 6lb. weights for his resistance exercises.    Expected Outcomes  Will continue to monitor and progress Pt as tolerated.  Will continue to monitor and progress Pt as tolerated.  Pt will continue to exercise at home.  Patient will continue exercise in the virtual cardiac rehab program.        Discharge Exercise Prescription (Final Exercise Prescription Changes): Exercise Prescription Changes - 06/16/19 1117      Response to Exercise   Blood Pressure (Admit)  107/55  Blood Pressure (Exercise)  104/42    Heart Rate (Admit)  65 bpm    Heart Rate (Exercise)  89 bpm    Rating of Perceived Exertion (Exercise)  13    Symptoms  None    Comments  Virtual cardiac rehab session.    Duration  Continue with 30 min of aerobic exercise without signs/symptoms of physical distress.    Intensity  THRR unchanged      Progression   Progression  Continue to progress workloads to  maintain intensity without signs/symptoms of physical distress.      Resistance Training   Training Prescription  Yes    Weight  6lbs    Reps  10-15    Time  10 Minutes      Interval Training   Interval Training  No      Treadmill   MPH  --    Grade  --    Minutes  --    METs  --      Bike   Minutes  30      NuStep   Level  --    SPM  --    Minutes  --    METs  --      Home Exercise Plan   Plans to continue exercise at  Home (comment)    Frequency  Add 3 additional days to program exercise sessions.    Initial Home Exercises Provided  05/03/19       Nutrition:  Target Goals: Understanding of nutrition guidelines, daily intake of sodium '1500mg'$ , cholesterol '200mg'$ , calories 30% from fat and 7% or less from saturated fats, daily to have 5 or more servings of fruits and vegetables.  Biometrics:    Nutrition Therapy Plan and Nutrition Goals:   Nutrition Assessments:   Nutrition Goals Re-Evaluation: Nutrition Goals Re-Evaluation    Row Name 05/18/19 1503             Goals   Current Weight  321 lb 3.4 oz (145.7 kg)       Nutrition Goal  Pt to identify food quantities necessary to achieve weight loss of 6-24 lb at graduation from cardiac rehab.       Expected Outcome  Ongoing - Pt down 5 lbs since start of rehab.         Personal Goal #2 Re-Evaluation   Personal Goal #2  Pt able to name foods that affect blood glucose         Personal Goal #3 Re-Evaluation   Personal Goal #3  Pt to identify and limit food sources of saturated fat, trans fat, refined carbohydrates and sodium          Nutrition Goals Discharge (Final Nutrition Goals Re-Evaluation): Nutrition Goals Re-Evaluation - 05/18/19 1503      Goals   Current Weight  321 lb 3.4 oz (145.7 kg)    Nutrition Goal  Pt to identify food quantities necessary to achieve weight loss of 6-24 lb at graduation from cardiac rehab.    Expected Outcome  Ongoing - Pt down 5 lbs since start of rehab.      Personal  Goal #2 Re-Evaluation   Personal Goal #2  Pt able to name foods that affect blood glucose      Personal Goal #3 Re-Evaluation   Personal Goal #3  Pt to identify and limit food sources of saturated fat, trans fat, refined carbohydrates and sodium       Psychosocial: Target Goals: Acknowledge presence or  absence of significant depression and/or stress, maximize coping skills, provide positive support system. Participant is able to verbalize types and ability to use techniques and skills needed for reducing stress and depression.  Initial Review & Psychosocial Screening:   Quality of Life Scores:  Scores of 19 and below usually indicate a poorer quality of life in these areas.  A difference of  2-3 points is a clinically meaningful difference.  A difference of 2-3 points in the total score of the Quality of Life Index has been associated with significant improvement in overall quality of life, self-image, physical symptoms, and general health in studies assessing change in quality of life.  PHQ-9: Recent Review Flowsheet Data    Depression screen [facility] 2/9 04/06/2019   Decreased Interest 0   Down, Depressed, Hopeless 0   PHQ - 2 Score 0     Interpretation of Total Score  Total Score Depression Severity:  1-4 = Minimal depression, 5-9 = Mild depression, 10-14 = Moderate depression, 15-19 = Moderately severe depression, 20-27 = Severe depression   Psychosocial Evaluation and Intervention:   Psychosocial Re-Evaluation: Psychosocial Re-Evaluation    [place] Name 04/29/19 1522 05/13/19 1027           Psychosocial Re-Evaluation   Current issues with  Current Stress Concerns  Current Stress Concerns      Comments  Charly has not voiced experiencing any increased stressors. Will continue to monitor and offer support as needed  Morell has not voiced experiencing any increased stressors. Will continue to monitor and offer support as needed      Interventions  Encouraged to attend Cardiac  Rehabilitation for the exercise  Encouraged to attend Cardiac Rehabilitation for the exercise      Continue Psychosocial Services   No Follow up required  No Follow up required        Initial Review   Source of Stress Concerns  Chronic Illness  Chronic Illness         Psychosocial Discharge (Final Psychosocial Re-Evaluation): Psychosocial Re-Evaluation - 05/13/19 1027      Psychosocial Re-Evaluation   Current issues with  Current Stress Concerns    Comments  Anthonny has not voiced experiencing any increased stressors. Will continue to monitor and offer support as needed    Interventions  Encouraged to attend Cardiac Rehabilitation for the exercise    Continue Psychosocial Services   No Follow up required      Initial Review   Source of Stress Concerns  Chronic Illness       Vocational Rehabilitation: Provide vocational rehab assistance to qualifying candidates.   Vocational Rehab Evaluation & Intervention:   Education: Education Goals: Education classes will be provided on a weekly basis, covering required topics. Participant will state understanding/return demonstration of topics presented.  Learning Barriers/Preferences:   Education Topics: Hypertension, Hypertension Reduction -Define heart disease and high blood pressure. Discus how high blood pressure affects the body and ways to reduce high blood pressure.   Exercise and Your Heart -Discuss why it is important to exercise, the FITT principles of exercise, normal and abnormal responses to exercise, and how to exercise safely.   Angina -Discuss definition of angina, causes of angina, treatment of angina, and how to decrease risk of having angina.   Cardiac Medications -Review what the following cardiac medications are used for, how they affect the body, and side effects that may occur when taking the medications.  Medications include Aspirin, Beta blockers, calcium channel blockers, ACE Inhibitors, angiotensin receptor  blockers, diuretics, digoxin, and antihyperlipidemics.   Congestive Heart Failure -Discuss the definition of CHF, how to live with CHF, the signs and symptoms of CHF, and how keep track of weight and sodium intake.   Heart Disease and Intimacy -Discus the effect sexual activity has on the heart, how changes occur during intimacy as we age, and safety during sexual activity.   Smoking Cessation / COPD -Discuss different methods to quit smoking, the health benefits of quitting smoking, and the definition of COPD.   Nutrition I: Fats -Discuss the types of cholesterol, what cholesterol does to the heart, and how cholesterol levels can be controlled.   Nutrition II: Labels -Discuss the different components of food labels and how to read food label   Heart Parts/Heart Disease and PAD -Discuss the anatomy of the heart, the pathway of blood circulation through the heart, and these are affected by heart disease.   Stress I: Signs and Symptoms -Discuss the causes of stress, how stress may lead to anxiety and depression, and ways to limit stress.   Stress II: Relaxation -Discuss different types of relaxation techniques to limit stress.   Warning Signs of Stroke / TIA -Discuss definition of a stroke, what the signs and symptoms are of a stroke, and how to identify when someone is having stroke.   Knowledge Questionnaire Score:   Core Components/Risk Factors/Patient Goals at Admission:   Core Components/Risk Factors/Patient Goals Review:  Goals and Risk Factor Review    Row Name 04/29/19 1526 05/13/19 1027 05/24/19 1400 06/23/19 1603 06/23/19 1609     Core Components/Risk Factors/Patient Goals Review   Personal Goals Review  Weight Management/Obesity;Lipids;Hypertension;Heart Failure  Weight Management/Obesity;Lipids;Hypertension;Heart Failure  Weight Management/Obesity;Lipids;Hypertension;Heart Failure  Weight Management/Obesity;Lipids;Hypertension;Heart Failure  Weight  Management/Obesity;Lipids;Hypertension;Heart Failure   Review  Pt with multiple CAD RFs eager to participate in CR exercise.  Ailton has been doing well with exercise. Reubin's vital signs have been stable. Rashard has been enthusiastic and motivated  Pt with multiple CAD RFs eager to participate in CR exercise.  Zi has been doing well with exercise. Bernell's vital signs have been stable. Gotti has been enthusiastic and motivated  Pt with multiple CAD RFs eager to participate in CR exercise.  Darrol has been doing well with exercise. Randel's vital signs have been stable. Trevelle has been enthusiastic and motivated  --  Keoni has been partcipating in virtual cardiac rehab   Expected Outcomes  Estiben will continue to participate in CR exercise, nutrition, and lifestyle modification opportunities.  Delaine will continue to participate in CR exercise, nutrition, and lifestyle modification opportunities.Pierson plans to transition to virtual cardiac rehab when in person cardiac rehab pauses due to the COVID 19 pandemic  Deiontae is currently exercising at home and is using the virtual cardiac rehab as phase 2 cardiac rehab is on hold due to the [place] 19 Pandemic.  --  In Person phase 2 cardiac rehab is resuming on 07/05/19      Core Components/Risk Factors/Patient Goals at Discharge (Final Review):  Goals and Risk Factor Review - 06/23/19 1609      Core Components/Risk Factors/Patient Goals Review   Personal Goals Review  Weight Management/Obesity;Lipids;Hypertension;Heart Failure    Review  Tao has been partcipating in virtual cardiac rehab    Expected Outcomes  In Person phase 2 cardiac rehab is resuming on 07/05/19       ITP Comments: ITP Comments    Row Name 04/29/19 1519 05/24/19 1352 06/23/19 1554  ITP Comments  30 Day ITP Review. Patient has good attendtion and participation in phase 2 cardiac rehab.  30 Day ITP Review. Henrique was doing well with exercise and had lost 3 kg since starting the porgram. Phase 2 cardiac  rehab is currently on hold due to [place]. Olivia has transitioned to the virtual cardiac rehab APP.  30 Day ITP Review. Phase 2 Cardiac rehab is resuming in person on Febuary 15, 2021        Comments: See ITP comments. Heath wants to complete virtual cardiac rehab and is not returning to in person phase 2 cardiac rehab at this time.Harrell Gave RN BSN

## 2019-06-23 NOTE — Progress Notes (Signed)
Cardiac Individual Treatment Plan  Patient Details  Name: Parker Calderon MRN: [id number] Date of Birth: 10-Oct-1960 Referring Provider:     CARDIAC REHAB PHASE II ORIENTATION from 04/06/2019 in [place]  Referring Provider  O'Neal, Cassie Freer, MD      Initial Encounter Date:    CARDIAC REHAB PHASE II ORIENTATION from 04/06/2019 in [place]  Date  04/06/19      Visit Diagnosis: Heart failure, chronic systolic ([place])  Patient's Home Medications on Admission:  Current Outpatient Medications:  .  apixaban (ELIQUIS) 5 MG TABS tablet, Take 1 tablet (5 mg total) by mouth 2 (two) times daily., Disp: 180 tablet, Rfl: 3 .  atorvastatin (LIPITOR) 40 MG tablet, Take 1 tablet (40 mg total) by mouth daily at 6 PM., Disp: 90 tablet, Rfl: 3 .  furosemide (LASIX) 80 MG tablet, Take 80 mg by mouth every other day. Has been taking every day, Disp: , Rfl:  .  metoprolol succinate (TOPROL-XL) 100 MG 24 hr tablet, Take 1 tablet (100 mg total) by mouth daily. Take with or immediately following a meal., Disp: 90 tablet, Rfl: 3 .  Multiple Vitamin (MULTIVITAMIN) tablet, Take 1 tablet by mouth daily., Disp: , Rfl:  .  sacubitril-valsartan (ENTRESTO) 97-103 MG, Take 1 tablet by mouth 2 (two) times daily., Disp: 60 tablet, Rfl: 11 .  spironolactone (ALDACTONE) 25 MG tablet, Take 1 tablet (25 mg total) by mouth daily., Disp: 90 tablet, Rfl: 3  Past Medical History: Past Medical History:  Diagnosis Date  . Arthritis    Rhinitis  . Atrial fibrillation ([place])   . CHF (congestive heart failure) ([place])   . Hyperlipidemia   . Hypertension   . Irritable bowel syndrome   . Left thigh pain    Lateral, aching  . Obesity, unspecified   . Varicose veins     Tobacco Use: Social History   Tobacco Use  Smoking Status Never Smoker  Smokeless Tobacco Never Used    Labs: Recent Review Flowsheet Data    Labs for ITP Cardiac and Pulmonary Rehab  Latest Ref Rng & Units 03/22/2019 03/23/2019 03/25/2019 03/25/2019   Cholestrol 0 - 200 mg/dL 159 159 - -   LDLCALC 0 - 99 mg/dL 119(H) 106(H) - -   HDL >40 mg/dL 31(L) 32(L) - -   Trlycerides <150 mg/dL 44 105 - -   Hemoglobin A1c 4.8 - 5.6 % 6.1(H) - - -   PHART 7.350 - 7.450 - - - 7.479(H)   PCO2ART 32.0 - 48.0 mmHg - - - 41.2   HCO3 20.0 - 28.0 mmol/L - - 30.0(H) 30.6(H)   TCO2 22 - 32 mmol/L - - 31 32   O2SAT % - - 77.0 99.0      Capillary Blood Glucose: No results found for: GLUCAP   Exercise Target Goals: Exercise Program Goal: Individual exercise prescription set using results from initial 6 min walk test and THRR while considering  patient's activity barriers and safety.   Exercise Prescription Goal: Starting with aerobic activity 30 plus minutes a day, 3 days per week for initial exercise prescription. Provide home exercise prescription and guidelines that participant acknowledges understanding prior to discharge.  Activity Barriers & Risk Stratification:   6 Minute Walk:   Oxygen Initial Assessment:   Oxygen Re-Evaluation:   Oxygen Discharge (Final Oxygen Re-Evaluation):   Initial Exercise Prescription:   Perform Capillary Blood Glucose checks as needed.  Exercise Prescription Changes: Exercise  Prescription Changes    Row Name 05/03/19 1000 05/10/19 0915 05/17/19 0915         Response to Exercise   Blood Pressure (Admit)  122/72  104/72  120/76     Blood Pressure (Exercise)  118/70  118/70  124/70     Blood Pressure (Exit)  104/64  102/60  108/64     Heart Rate (Admit)  72 bpm  82 bpm  77 bpm     Heart Rate (Exercise)  107 bpm  102 bpm  121 bpm     Heart Rate (Exit)  79 bpm  78 bpm  82 bpm     Rating of Perceived Exertion (Exercise)  '7  9  12     ' Symptoms  None  None  None     Comments  -  -  Pt last day for in person CR program. Transitioning to virtual CR program.      Duration  Continue with 30 min of aerobic exercise without signs/symptoms of  physical distress.  Continue with 30 min of aerobic exercise without signs/symptoms of physical distress.  Continue with 30 min of aerobic exercise without signs/symptoms of physical distress.     Intensity  THRR unchanged  THRR unchanged  THRR unchanged       Progression   Progression  Continue to progress workloads to maintain intensity without signs/symptoms of physical distress.  Continue to progress workloads to maintain intensity without signs/symptoms of physical distress.  Continue to progress workloads to maintain intensity without signs/symptoms of physical distress.     Average METs  3  2.9  3       Resistance Training   Training Prescription  Yes  Yes  Yes     Weight  5lbs  5lbs  5lbs     Reps  10-15  10-15  10-15     Time  10 Minutes  10 Minutes  10 Minutes       Interval Training   Interval Training  -  No  No       Treadmill   MPH  2.9  2.9  2.9     Grade  '1  1  1     ' Minutes  '15  15  15     ' METs  3.62  3.62  3.62       NuStep   Level  '3  4  4     ' SPM  85  85  85     Minutes  '15  15  15     ' METs  2.4  2.3  2.4       Home Exercise Plan   Plans to continue exercise at  Home (comment)  Home (comment)  Home (comment)     Frequency  Add 3 additional days to program exercise sessions.  Add 3 additional days to program exercise sessions.  Add 3 additional days to program exercise sessions.     Initial Home Exercises Provided  05/03/19  05/03/19  05/03/19        Exercise Comments: Exercise Comments    Row Name 04/29/19 1330 05/03/19 1038 05/20/19 1323       Exercise Comments  Pt is progressing well in the program. Pt continues to exercsie at home.  Reviewed HEP with Pt. Pt understands home exercise goals.  Pt is progressing well. 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 Pt will start logging his exercise on the app.  Exercise Goals and Review:   Exercise Goals Re-Evaluation : Exercise Goals Re-Evaluation    Row Name 04/29/19  1327 05/03/19 1034 05/20/19 1320         Exercise Goal Re-Evaluation   Exercise Goals Review  Increase Physical Activity;Increase Strength and Stamina;Able to understand and use rate of perceived exertion (RPE) scale;Knowledge and understanding of Target Heart Rate Range (THRR);Able to check pulse independently;Understanding of Exercise Prescription  Increase Physical Activity;Increase Strength and Stamina;Able to understand and use rate of perceived exertion (RPE) scale;Knowledge and understanding of Target Heart Rate Range (THRR);Able to check pulse independently;Understanding of Exercise Prescription  Increase Physical Activity;Increase Strength and Stamina;Able to understand and use rate of perceived exertion (RPE) scale;Knowledge and understanding of Target Heart Rate Range (THRR);Able to check pulse independently;Understanding of Exercise Prescription     Comments  Pt is progressing well in the prorgram. Pt continues to increase workloads and MET levels. Pt MET level is 2.6. Pt is exercising at home by walking 2-4 days per week in addition to CR program.  Reviewed HEP with Pt. Pt understands exercise goals, RPE scale, THRR, end points of exercise, and weather precatuions. Pt stated he has purchased a stationary bike that he will begin to use. Pt stated he would use the bike 2-4 days per week for 30-45 minutes in addition to CR program.  Pt is progressing well in the program and has a MET level of 3.0. Pt completed 13 exercise sessions. Pt was offered the virutal CR program due to department clousre for [place]. Pt wants to participate in virtual CR program and is active in the app. He will start logging his exercise sessions on the app. Pt was encourgaed to exercise 5-7 days per week for 30-45 minutes per session.     Expected Outcomes  Will continue to monitor and progress Pt as tolerated.  Will continue to monitor and progress Pt as tolerated.  Pt will continue to exercise at home.          Discharge Exercise Prescription (Final Exercise Prescription Changes): Exercise Prescription Changes - 05/17/19 0915      Response to Exercise   Blood Pressure (Admit)  120/76    Blood Pressure (Exercise)  124/70    Blood Pressure (Exit)  108/64    Heart Rate (Admit)  77 bpm    Heart Rate (Exercise)  121 bpm    Heart Rate (Exit)  82 bpm    Rating of Perceived Exertion (Exercise)  12    Symptoms  None    Comments  Pt last day for in person CR program. Transitioning to virtual CR program.     Duration  Continue with 30 min of aerobic exercise without signs/symptoms of physical distress.    Intensity  THRR unchanged      Progression   Progression  Continue to progress workloads to maintain intensity without signs/symptoms of physical distress.    Average METs  3      Resistance Training   Training Prescription  Yes    Weight  5lbs    Reps  10-15    Time  10 Minutes      Interval Training   Interval Training  No      Treadmill   MPH  2.9    Grade  1    Minutes  15    METs  3.62      NuStep   Level  4    SPM  85    Minutes  15    METs  2.4      Home Exercise Plan   Plans to continue exercise at  Home (comment)    Frequency  Add 3 additional days to program exercise sessions.    Initial Home Exercises Provided  05/03/19       Nutrition:  Target Goals: Understanding of nutrition guidelines, daily intake of sodium <1579m, cholesterol <2041m calories 30% from fat and 7% or less from saturated fats, daily to have 5 or more servings of fruits and vegetables.  Biometrics:    Nutrition Therapy Plan and Nutrition Goals:   Nutrition Assessments:   Nutrition Goals Re-Evaluation: Nutrition Goals Re-Evaluation    Row Name 05/18/19 1503             Goals   Current Weight  321 lb 3.4 oz (145.7 kg)       Nutrition Goal  Pt to identify food quantities necessary to achieve weight loss of 6-24 lb at graduation from cardiac rehab.       Expected Outcome   Ongoing - Pt down 5 lbs since start of rehab.         Personal Goal #2 Re-Evaluation   Personal Goal #2  Pt able to name foods that affect blood glucose         Personal Goal #3 Re-Evaluation   Personal Goal #3  Pt to identify and limit food sources of saturated fat, trans fat, refined carbohydrates and sodium          Nutrition Goals Discharge (Final Nutrition Goals Re-Evaluation): Nutrition Goals Re-Evaluation - 05/18/19 1503      Goals   Current Weight  321 lb 3.4 oz (145.7 kg)    Nutrition Goal  Pt to identify food quantities necessary to achieve weight loss of 6-24 lb at graduation from cardiac rehab.    Expected Outcome  Ongoing - Pt down 5 lbs since start of rehab.      Personal Goal #2 Re-Evaluation   Personal Goal #2  Pt able to name foods that affect blood glucose      Personal Goal #3 Re-Evaluation   Personal Goal #3  Pt to identify and limit food sources of saturated fat, trans fat, refined carbohydrates and sodium       Psychosocial: Target Goals: Acknowledge presence or absence of significant depression and/or stress, maximize coping skills, provide positive support system. Participant is able to verbalize types and ability to use techniques and skills needed for reducing stress and depression.  Initial Review & Psychosocial Screening:   Quality of Life Scores:  Scores of 19 and below usually indicate a poorer quality of life in these areas.  A difference of  2-3 points is a clinically meaningful difference.  A difference of 2-3 points in the total score of the Quality of Life Index has been associated with significant improvement in overall quality of life, self-image, physical symptoms, and general health in studies assessing change in quality of life.  PHQ-9: Recent Review Flowsheet Data    [facility]   Decreased Interest 0   Down, Depressed, Hopeless 0   PHQ - 2 Score 0     Interpretation of Total Score  Total Score Depression  Severity:  1-4 = Minimal depression, 5-9 = Mild depression, 10-14 = Moderate depression, 15-19 = Moderately severe depression, 20-27 = Severe depression   Psychosocial Evaluation and Intervention:   Psychosocial Re-Evaluation: Psychosocial Re-Evaluation    [place] 04/29/19 1522 05/13/19 1027  Psychosocial Re-Evaluation   Current issues with  Current Stress Concerns  Current Stress Concerns      Comments  Parker Calderon has not voiced experiencing any increased stressors. Will continue to monitor and offer support as needed  Parker Calderon has not voiced experiencing any increased stressors. Will continue to monitor and offer support as needed      Interventions  Encouraged to attend Cardiac Rehabilitation for the exercise  Encouraged to attend Cardiac Rehabilitation for the exercise      Continue Psychosocial Services   No Follow up required  No Follow up required        Initial Review   Source of Stress Concerns  Chronic Illness  Chronic Illness         Psychosocial Discharge (Final Psychosocial Re-Evaluation): Psychosocial Re-Evaluation - 05/13/19 1027      Psychosocial Re-Evaluation   Current issues with  Current Stress Concerns    Comments  Parker Calderon has not voiced experiencing any increased stressors. Will continue to monitor and offer support as needed    Interventions  Encouraged to attend Cardiac Rehabilitation for the exercise    Continue Psychosocial Services   No Follow up required      Initial Review   Source of Stress Concerns  Chronic Illness       Vocational Rehabilitation: Provide vocational rehab assistance to qualifying candidates.   Vocational Rehab Evaluation & Intervention:   Education: Education Goals: Education classes will be provided on a weekly basis, covering required topics. Participant will state understanding/return demonstration of topics presented.  Learning Barriers/Preferences:   Education Topics: Hypertension, Hypertension Reduction -Define  heart disease and high blood pressure. Discus how high blood pressure affects the body and ways to reduce high blood pressure.   Exercise and Your Heart -Discuss why it is important to exercise, the FITT principles of exercise, normal and abnormal responses to exercise, and how to exercise safely.   Angina -Discuss definition of angina, causes of angina, treatment of angina, and how to decrease risk of having angina.   Cardiac Medications -Review what the following cardiac medications are used for, how they affect the body, and side effects that may occur when taking the medications.  Medications include Aspirin, Beta blockers, calcium channel blockers, ACE Inhibitors, angiotensin receptor blockers, diuretics, digoxin, and antihyperlipidemics.   Congestive Heart Failure -Discuss the definition of CHF, how to live with CHF, the signs and symptoms of CHF, and how keep track of weight and sodium intake.   Heart Disease and Intimacy -Discus the effect sexual activity has on the heart, how changes occur during intimacy as we age, and safety during sexual activity.   Smoking Cessation / COPD -Discuss different methods to quit smoking, the health benefits of quitting smoking, and the definition of COPD.   Nutrition I: Fats -Discuss the types of cholesterol, what cholesterol does to the heart, and how cholesterol levels can be controlled.   Nutrition II: Labels -Discuss the different components of food labels and how to read food label   Heart Parts/Heart Disease and PAD -Discuss the anatomy of the heart, the pathway of blood circulation through the heart, and these are affected by heart disease.   Stress I: Signs and Symptoms -Discuss the causes of stress, how stress may lead to anxiety and depression, and ways to limit stress.   Stress II: Relaxation -Discuss different types of relaxation techniques to limit stress.   Warning Signs of Stroke / TIA -Discuss definition of a  stroke, what the signs  and symptoms are of a stroke, and how to identify when someone is having stroke.   Knowledge Questionnaire Score:   Core Components/Risk Factors/Patient Goals at Admission:   Core Components/Risk Factors/Patient Goals Review:  Goals and Risk Factor Review    Row Name 04/29/19 1526 05/13/19 1027 05/24/19 1400         Core Components/Risk Factors/Patient Goals Review   Personal Goals Review  Weight Management/Obesity;Lipids;Hypertension;Heart Failure  Weight Management/Obesity;Lipids;Hypertension;Heart Failure  Weight Management/Obesity;Lipids;Hypertension;Heart Failure     Review  Pt with multiple CAD RFs eager to participate in CR exercise.  Parker Calderon has been doing well with exercise. Parker Calderon's vital signs have been stable. Parker Calderon has been enthusiastic and motivated  Pt with multiple CAD RFs eager to participate in CR exercise.  Parker Calderon has been doing well with exercise. Parker Calderon's vital signs have been stable. Parker Calderon has been enthusiastic and motivated  Pt with multiple CAD RFs eager to participate in CR exercise.  Parker Calderon has been doing well with exercise. Parker Calderon's vital signs have been stable. Parker Calderon has been enthusiastic and motivated     Expected Outcomes  Parker Calderon will continue to participate in CR exercise, nutrition, and lifestyle modification opportunities.  Parker Calderon will continue to participate in CR exercise, nutrition, and lifestyle modification opportunities.Parker Calderon plans to transition to virtual cardiac rehab when in person cardiac rehab pauses due to the COVID 19 pandemic  Parker Calderon is currently exercising at home and is using the virtual cardiac rehab as phase 2 cardiac rehab is on hold due to the [place] 19 Pandemic.        Core Components/Risk Factors/Patient Goals at Discharge (Final Review):  Goals and Risk Factor Review - 05/24/19 1400      Core Components/Risk Factors/Patient Goals Review   Personal Goals Review  Weight Management/Obesity;Lipids;Hypertension;Heart Failure    Review  Pt  with multiple CAD RFs eager to participate in CR exercise.  Parker Calderon has been doing well with exercise. Parker Calderon's vital signs have been stable. Parker Calderon has been enthusiastic and motivated    Expected Outcomes  Rajan is currently exercising at home and is using the virtual cardiac rehab as phase 2 cardiac rehab is on hold due to the [place] 19 Pandemic.       ITP Comments: ITP Comments    Row Name 04/29/19 1519 05/24/19 1352 06/23/19 1554       ITP Comments  30 Day ITP Review. Patient has good attendtion and participation in phase 2 cardiac rehab.  30 Day ITP Review. Parker Calderon was doing well with exercise and had lost 3 kg since starting the porgram. Phase 2 cardiac rehab is currently on hold due to [place]. Parker Calderon has transitioned to the virtual cardiac rehab APP.  30 Day ITP Review. Phase 2 Cardiac rehab is resuming in person on Febuary 15, 2021        Comments: See ITP comments.Barnet Pall, RN,BSN 06/23/2019 4:13 PM

## 2019-06-23 NOTE — Telephone Encounter (Signed)
Pt insurance is active and benefits verified through [employer] Co-pay 0, DED $4,500/$542.95 met, out of pocket $7,350/$542.95 met, co-insurance 20%. no pre-authorization required. Passport, 06/23/2019, REF# [id number]  Will contact patient to see if he is interested in the Cardiac Rehab Program. If interested, patient will need to complete follow up appt. Once completed, patient will be contacted for scheduling upon review by the RN Navigator.

## 2019-06-24 ENCOUNTER — Telehealth (HOSPITAL_COMMUNITY): Payer: Self-pay

## 2019-06-24 NOTE — Telephone Encounter (Signed)
Called pt to see if he wanted to return to onsite cardiac rehab to finish up his 11 sessions.. pt declined to come back to onsite and wants to finish on virtual.

## 2019-06-25 ENCOUNTER — Ambulatory Visit (HOSPITAL_COMMUNITY)
Admission: RE | Admit: 2019-06-25 | Discharge: 2019-06-25 | Disposition: A | Discharge status: Home / Self Care | Payer: BC Managed Care – PPO | Source: Ambulatory Visit | Attending: Physician Assistant | Admitting: Physician Assistant

## 2019-06-25 ENCOUNTER — Telehealth (HOSPITAL_COMMUNITY): Payer: Self-pay

## 2019-06-25 ENCOUNTER — Other Ambulatory Visit: Payer: Self-pay

## 2019-06-25 ENCOUNTER — Encounter (HOSPITAL_COMMUNITY)
Admission: RE | Admit: 2019-06-25 | Discharge: 2019-06-25 | Disposition: A | Discharge status: Home / Self Care | Payer: BC Managed Care – PPO | Source: Ambulatory Visit | Attending: Cardiovascular Disease | Admitting: Cardiovascular Disease

## 2019-06-25 ENCOUNTER — Telehealth: Payer: Self-pay | Admitting: Physician Assistant

## 2019-06-25 VITALS — BP 158/100 | HR 98 | Ht 71.0 in | Wt 319.0 lb

## 2019-06-25 DIAGNOSIS — D6869 Other thrombophilia: Secondary | ICD-10-CM

## 2019-06-25 DIAGNOSIS — M1990 Unspecified osteoarthritis, unspecified site: Secondary | ICD-10-CM | POA: Diagnosis not present

## 2019-06-25 DIAGNOSIS — I110 Hypertensive heart disease with heart failure: Secondary | ICD-10-CM | POA: Insufficient documentation

## 2019-06-25 DIAGNOSIS — Z6841 Body Mass Index (BMI) 40.0 and over, adult: Secondary | ICD-10-CM | POA: Insufficient documentation

## 2019-06-25 DIAGNOSIS — Z7901 Long term (current) use of anticoagulants: Secondary | ICD-10-CM | POA: Insufficient documentation

## 2019-06-25 DIAGNOSIS — E785 Hyperlipidemia, unspecified: Secondary | ICD-10-CM | POA: Diagnosis not present

## 2019-06-25 DIAGNOSIS — Z8249 Family history of ischemic heart disease and other diseases of the circulatory system: Secondary | ICD-10-CM | POA: Diagnosis not present

## 2019-06-25 DIAGNOSIS — I4891 Unspecified atrial fibrillation: Secondary | ICD-10-CM | POA: Diagnosis not present

## 2019-06-25 DIAGNOSIS — I5022 Chronic systolic (congestive) heart failure: Secondary | ICD-10-CM

## 2019-06-25 DIAGNOSIS — Z841 Family history of disorders of kidney and ureter: Secondary | ICD-10-CM | POA: Insufficient documentation

## 2019-06-25 DIAGNOSIS — Z79899 Other long term (current) drug therapy: Secondary | ICD-10-CM | POA: Diagnosis not present

## 2019-06-25 DIAGNOSIS — I4819 Other persistent atrial fibrillation: Secondary | ICD-10-CM | POA: Diagnosis not present

## 2019-06-25 MED ORDER — AMIODARONE HCL 200 MG PO TABS: ORAL_TABLET | ORAL | 0 refills | Status: DC

## 2019-06-25 NOTE — Patient Instructions (Signed)
Start Amiodarone 200mg  twice a day for 2 weeks then 200mg  once a day  Follow up with Dr. Audie Box as scheduled.

## 2019-06-25 NOTE — Progress Notes (Signed)
Primary Care Physician: Shirline Frees, MD Primary Cardiologist: Dr Audie Box Primary Electrophysiologist: Dr Rayann Heman Referring Physician: Almyra Deforest PA   Parker Calderon is a 59 y.o. male with a history of systolic HF ([id number], normal LHC), persistent Afib s/p TEE/DCCV, morbidy obesity (BMI 36), HTN who presents for consultation in the [facility]. The patient was initially diagnosed with atrial fibrillation after being admitted with acute systolic CHF on [id number] and found to be in afib with RVR. His systolic function was severely reduced <20%. LHC showed normal coronary arteries. Patient is on Eliquis for a CHADS2VASC score of 2. He underwent TEE/DCCV on 03/26/19 and was weaned off amiodarone. Patient reports that he has done well with no heart racing or palpitations. He has no symptoms of fluid overload and is actively participating in cardiac rehab. He stopped by cardiac rehab today and was found to be in afib again. Patient does note that his pulse oximeter has been showing an irregular heart beat for the last two days. Pulse rate has been controlled in the 80s-90s. Patient is completely unaware of his arrhythmia. He does admit to recent decongestant use. He denies any significant alcohol use or snoring.  Today, he denies symptoms of palpitations, chest pain, shortness of breath, orthopnea, PND, lower extremity edema, dizziness, presyncope, syncope, snoring, daytime somnolence, bleeding, or neurologic sequela. The patient is tolerating medications without difficulties and is otherwise without complaint today.    Atrial Fibrillation Risk Factors:  he does not have symptoms or diagnosis of sleep apnea. he does not have a history of rheumatic fever. he does not have a history of alcohol use.   he has a BMI of Body mass index is 44.49 kg/m.Marland Kitchen Filed Weights   06/25/19 1031  Weight: (!) 144.7 kg    Family History  Problem Relation Age of Onset  . High blood pressure  Mother   . Kidney failure Father      Atrial Fibrillation Management history:  Previous antiarrhythmic drugs: amiodarone Previous cardioversions: 03/26/19 Previous ablations: none CHADS2VASC score: 2 Anticoagulation history: Eliquis   Past Medical History:  Diagnosis Date  . Arthritis    Rhinitis  . Atrial fibrillation ([place])   . CHF (congestive heart failure) ([place])   . Hyperlipidemia   . Hypertension   . Irritable bowel syndrome   . Left thigh pain    Lateral, aching  . Obesity, unspecified   . Varicose veins    Past Surgical History:  Procedure Laterality Date  . CARDIAC CATHETERIZATION    . CARDIOVERSION N/A 03/26/2019   Procedure: CARDIOVERSION;  Surgeon: Josue Hector, MD;  Location: [facility] ENDOSCOPY;  Service: Cardiovascular;  Laterality: N/A;  . RIGHT/LEFT HEART CATH AND CORONARY ANGIOGRAPHY N/A 03/25/2019   Procedure: RIGHT/LEFT HEART CATH AND CORONARY ANGIOGRAPHY;  Surgeon: Martinique, Peter M, MD;  Location: [facility] CV LAB;  Service: Cardiovascular;  Laterality: N/A;  . TEE WITHOUT CARDIOVERSION N/A 03/26/2019   Procedure: TRANSESOPHAGEAL ECHOCARDIOGRAM (TEE);  Surgeon: Josue Hector, MD;  Location: [facility] ENDOSCOPY;  Service: Cardiovascular;  Laterality: N/A;    Current Outpatient Medications  Medication Sig Dispense Refill  . apixaban (ELIQUIS) 5 MG TABS tablet Take 1 tablet (5 mg total) by mouth 2 (two) times daily. 180 tablet 3  . atorvastatin (LIPITOR) 40 MG tablet Take 1 tablet (40 mg total) by mouth daily at 6 PM. 90 tablet 3  . chlorhexidine (PERIDEX) 0.12 % solution SMARTSIG:1 Capful(s) By Mouth 3 Times Daily    .  furosemide (LASIX) 80 MG tablet Take 80 mg by mouth every other day.     . metoprolol succinate (TOPROL-XL) 100 MG 24 hr tablet Take 1 tablet (100 mg total) by mouth daily. Take with or immediately following a meal. 90 tablet 3  . Multiple Vitamin (MULTIVITAMIN) tablet Take 1 tablet by mouth daily.    . sacubitril-valsartan (ENTRESTO) 97-103 MG Take  1 tablet by mouth 2 (two) times daily. 60 tablet 11  . spironolactone (ALDACTONE) 25 MG tablet Take 1 tablet (25 mg total) by mouth daily. 90 tablet 3  . amiodarone (PACERONE) 200 MG tablet Take 1 tablet by mouth twice a day for 2 weeks then reduce to 1 tablet daily 60 tablet 0   No current facility-administered medications for this encounter.    No Known Allergies  Social History   Socioeconomic History  . Marital status: Single    Spouse name: Not on file  . Number of children: Not on file  . Years of education: Not on file  . Highest education level: High school graduate  Occupational History  . Not on file  Tobacco Use  . Smoking status: Never Smoker  . Smokeless tobacco: Never Used  Substance and Sexual Activity  . Alcohol use: No  . Drug use: No  . Sexual activity: Not on file  Other Topics Concern  . Not on file  Social History Narrative  . Not on file   Social Determinants of Health   Financial Resource Strain: Low Risk   . Difficulty of Paying Living Expenses: Not hard at all  Food Insecurity: No Food Insecurity  . Worried About Charity fundraiser in the Last Year: Never true  . Ran Out of Food in the Last Year: Never true  Transportation Needs: No Transportation Needs  . Lack of Transportation (Medical): No  . Lack of Transportation (Non-Medical): No  Physical Activity: Unknown  . Days of Exercise per Week: 0 days  . Minutes of Exercise per Session: Not on file  Stress: No Stress Concern Present  . Feeling of Stress : Not at all  Social Connections:   . Frequency of Communication with Friends and Family: Not on file  . Frequency of Social Gatherings with Friends and Family: Not on file  . Attends Religious Services: Not on file  . Active Member of Clubs or Organizations: Not on file  . Attends Archivist Meetings: Not on file  . Marital Status: Not on file  Intimate Partner Violence:   . Fear of Current or Ex-Partner: Not on file  .  Emotionally Abused: Not on file  . Physically Abused: Not on file  . Sexually Abused: Not on file     ROS- All systems are reviewed and negative except as per the HPI above.  Physical Exam: Vitals:   06/25/19 1031  BP: (!) 158/100  Pulse: 98  Weight: (!) 144.7 kg  Height: 5\' 11"  (1.803 m)    GEN- The patient is well appearing obese male, alert and oriented x 3 today.   Head- normocephalic, atraumatic Eyes-  Sclera clear, conjunctiva pink Ears- hearing intact Oropharynx- clear Neck- supple  Lungs- Clear to ausculation bilaterally, normal work of breathing Heart- irregular rate and rhythm, no murmurs, rubs or gallops  GI- soft, NT, ND, + BS Extremities- no clubbing, cyanosis. Non pitting edema MS- no significant deformity or atrophy Skin- no rash or lesion Psych- euthymic mood, full affect Neuro- strength and sensation are intact  Wt Readings  from Last 3 Encounters:  06/25/19 (!) 144.7 kg  06/10/19 (!) 146 kg  04/26/19 (!) 145 kg    EKG today demonstrates afib HR 112, QRS 78, QTc 461  Echo 03/22/19 demonstrated  1. Left ventricular ejection fraction, by visual estimation, is <20%. The  left ventricle has severely decreased function. There is no left  ventricular hypertrophy.  2. Definity contrast agent was given IV to delineate the left ventricular  endocardial borders.  3. Moderately dilated left ventricular internal cavity size.  4. The left ventricle demonstrates global hypokinesis.  5. Left ventricular diastolic parameters are indeterminate.  6. LV thrombus excluded by echo contrast.  7. Global right ventricle has severely reduced systolic function.The  right ventricular size is not well visualized. Right vetricular wall  thickness was not assessed.  8. Left atrial size was normal.  9. Right atrial size was normal.  10. Mild to moderate aortic valve annular calcification.  11. The mitral valve is normal in structure. Mild to moderate mitral valve    regurgitation.  12. The tricuspid valve is normal in structure. Tricuspid valve  regurgitation mild-moderate.  13. The aortic valve is tricuspid. Aortic valve regurgitation is not  visualized. No evidence of aortic valve sclerosis or stenosis.  14. The pulmonic valve was not well visualized. Pulmonic valve  regurgitation is not visualized.  15. Mildly elevated pulmonary artery systolic pressure.  16. The inferior vena cava is dilated in size with <50% respiratory  variability, suggesting right atrial pressure of 15 mmHg.   Epic records are reviewed at length today  CHA2DS2-VASc Score = 2 The patient's score is based upon: CHF History: Yes HTN History: Yes Age : < 65 Diabetes History: No Stroke History: No Vascular Disease History: No Gender: Male      ASSESSMENT AND PLAN: 1. Persistent Atrial Fibrillation (ICD10:  I48.19) The patient's CHA2DS2-VASc score is 2, indicating a 2.2% annual risk of stroke.   We discussed therapeutic options today including AAD +/- repeat DCCV. We discussed that even though he is asymptomatic, we would like to purse a rhythm strategy given his h/o tachycardia mediated CM. Patient in agreement with plan. Would avoid Multaq and class 1C with CHF. QT in SR ~480 which is too long for dofetilide. Will plan to resume amiodarone 200 mg BID for two weeks then decrease to 200 mg daily. If he does not convert on the medication, would consider repeat DCCV. Will not add more rate control at this time. He is likely not a good ablation candidate given his weight and LA size 5.2 cm. With lifestyle modification and weight loss he may be an candidate in the future. Continue Eliquis 5 mg BID. Patient denies any missed doses in the last 3 weeks. Continue Toprol 100 mg daily  2. Secondary Hypercoagulable State (ICD10:  D68.69) The patient is at significant risk for stroke/thromboembolism based upon his CHA2DS2-VASc Score of 2.  Continue Apixaban (Eliquis).   3.  Obesity Body mass index is 44.49 kg/m. Lifestyle modification was discussed at length including regular exercise and weight reduction. Likely contributing to his afib persistence.   4. Chronic systolic CHF ? Tachycardia mediated. Rhythm control will be important for him moving forward. No signs or symptoms of fluid overload today.  5. HTN Stable, no changes today.   Follow up with Dr Audie Box as scheduled.    [facility] [address] [place],  [address] [phone] 06/25/2019 11:16 AM

## 2019-06-25 NOTE — Progress Notes (Signed)
Cardiac Rehab Note:  Mr. Parker Calderon presents to Cardiac Rehab today to receive assistance with downloading the [facility] CR app. Mr. Parker Calderon also requested RN to check his home BP cuff and pulse oximeter as he was getting questionable numbers for his HR. Upon RN assessment, Mr. Parker Calderon's pulse was notably high and irregular. He was placed on zoll monitor and found to be in Afib with rate low 100s-155. Patient asymptomatic. BP124/88, SaO2 96% RA. Spoke with Loma Sousa, Cards PA who suggested patient be seen today in Afib clinic. RN obtained appointment for patient with Malka So, PA in Afib clinic and transported patient to appointment via transport chair. Chair side report provided to clinic RN. Patient appreciative of care.  Plan: Will continue to follow patient closely via EMR and telephone PRN. Will schedule face to face appointment with CR RN for VCR app instillation once medically stable to exercise.  Claris Pech E. [name] RN, BSN [place]. [facility] Cardiac and Pulmonary Rehab [place] Direct: [phone]

## 2019-06-25 NOTE — Telephone Encounter (Signed)
Paged by cardiopulmonary rehab [phone].  Apparently patient arrived to pick up some remote exercise equipment and found to have recurrent atrial fibrillation with RVR with baseline heart rate somewhere between 120s to 140s.  He has a history of tachycardia mediated cardiomyopathy.  I recommend early visit with either atrial fibrillation clinic or Dr. Kathalene Frames office for further assessment.  Blood pressure is normal and the patient is largely asymptomatic.  EKG or strip is not available for me to review.

## 2019-06-25 NOTE — Telephone Encounter (Signed)
Virtual Cardiac Rehab Note:  Unsuccessful telephone encounter to Mr. Larinda Buttery to follow up on his inability to download the [facility] Cardiac Rehab app on his new phone. Left Hip[employer] compliant VM message requesting call back to [phone].  Maurizio Geno E. [name] RN, BSN [place]. [facility] Cardiac and Pulmonary Rehab [place] Direct: [phone]

## 2019-06-28 ENCOUNTER — Encounter (HOSPITAL_COMMUNITY)
Admission: RE | Admit: 2019-06-28 | Discharge: 2019-06-28 | Disposition: A | Discharge status: Home / Self Care | Payer: Self-pay | Source: Ambulatory Visit | Attending: Cardiovascular Disease | Admitting: Cardiovascular Disease

## 2019-06-28 ENCOUNTER — Other Ambulatory Visit: Payer: Self-pay

## 2019-06-28 DIAGNOSIS — I5022 Chronic systolic (congestive) heart failure: Secondary | ICD-10-CM | POA: Insufficient documentation

## 2019-06-28 NOTE — Addendum Note (Signed)
Encounter addended by: Sol Passer on: 06/28/2019 11:18 AM  Actions taken: Flowsheet data copied forward, Flowsheet accepted

## 2019-06-28 NOTE — Progress Notes (Signed)
Almyra Free:  Can we get Mr. Goldsberry to see Dr. Curt Bears for Afib ablation consideration.   Evalina Field, MD

## 2019-06-28 NOTE — Progress Notes (Signed)
Cardiac Rehab Note:  Incoming telephone encounter with Mr Zelma Fiene who wishes to discuss his recent conversion to Afib with RVR. Mr. Swider was transported to the Afib clinic 06/26/19. He was prescribed Amiodarone 200mg  BID x 2 weeks then decrease to 200mg  daily. Mr. Sapone calls today to expresses appreciation for his care. He states his pulse rate is 65 this morning. Mr. Diemert is coached on checking a radial pulse. He states his pulse feels "regular" and "consistant". He states he is feeling much better "although I didn't think I felt bad Friday". He is encouraged to decrease his caffine intake significantly and limit his high sodium foods. Mr. prish trimper understanding. He will not exercise until he follows up with Dr. Audie Box 07/01/19. If he is given the OK to exercise, he will be assisted with utilizing the [facility] Cardiac Rehab app.  Plan: Will continue to follow up with patient remotely   [place]. [name] RN, BSN [place]. [facility] Cardiac and Pulmonary Rehab [place] Direct: [phone]

## 2019-06-29 NOTE — Progress Notes (Signed)
Cardiology Office Note:   Date:  07/01/2019  NAME:  Parker Calderon    MRN: [id number] DOB:  01-May-1961   PCP:  Shirline Frees, MD  Cardiologist:  Evalina Field, MD   Referring MD: Shirline Frees, MD   Chief Complaint  Patient presents with  . Congestive Heart Failure  . Atrial Fibrillation   History of Present Illness:   Parker Calderon is a 59 y.o. male with a hx of systolic heart failure, atrial fibrillation, morbid obesity who presents for follow-up of systolic heart failure.  He has had recurrence of atrial fibrillation.  He was evaluated in the atrial fibrillation clinic and noted to be in A. fib.  He was placed back on amiodarone.  I have reached out to electrophysiology to get him evaluated for cardiac ablation.  He reports he feels well.  Blood pressure a bit up on reading today but values at home have been within normal limits.  He reports no increased lower extremity edema.  Tolerating his heart failure medications well.  He reports he is unaware of his atrial fibrillation coming back.  His EKG today demonstrates likely sinus rhythm with intermittent PACs.  He also is likely having some intermittent runs of atrial fibrillation.  He remains on amiodarone 200 mg twice daily with plans to reduce this to 200 mg daily.  I have asked him that I think will be worthwhile to be evaluated by electrophysiology for cardiac ablation.  Given that his arrhythmia was diagnosed in the setting of reduced ejection fraction, it is likely that this caused his heart failure.  He is amendable to this.  He denies chest pain, shortness of breath, palpitations, lower extremity edema today.  Unfortunately, due to the coronavirus pandemic he has not resumed cardiac rehabilitation.  He reports he is mainly just going to work and not doing aerobic activity as he was doing.  His weight loss has stopped.  He has not gained weight since her last visit.  Problem List: 1. Non-ischemic CM (EF 25%, 03/2019) 2. Normal  coronaries on [facility] 03/25/2019 3. Atrial fibrillation s/p DCCV 03/2019 -recurrence 06/25/2019 4. Morbid obesity  5. Hypertension   Past Medical History: Past Medical History:  Diagnosis Date  . Arthritis    Rhinitis  . Atrial fibrillation ([place])   . CHF (congestive heart failure) ([place])   . Hyperlipidemia   . Hypertension   . Irritable bowel syndrome   . Left thigh pain    Lateral, aching  . Obesity, unspecified   . Varicose veins     Past Surgical History: Past Surgical History:  Procedure Laterality Date  . CARDIAC CATHETERIZATION    . CARDIOVERSION N/A 03/26/2019   Procedure: CARDIOVERSION;  Surgeon: Josue Hector, MD;  Location: [facility] ENDOSCOPY;  Service: Cardiovascular;  Laterality: N/A;  . RIGHT/LEFT HEART CATH AND CORONARY ANGIOGRAPHY N/A 03/25/2019   Procedure: RIGHT/LEFT HEART CATH AND CORONARY ANGIOGRAPHY;  Surgeon: Martinique, Peter M, MD;  Location: [place] CV LAB;  Service: Cardiovascular;  Laterality: N/A;  . TEE WITHOUT CARDIOVERSION N/A 03/26/2019   Procedure: TRANSESOPHAGEAL ECHOCARDIOGRAM (TEE);  Surgeon: Josue Hector, MD;  Location: [facility] ENDOSCOPY;  Service: Cardiovascular;  Laterality: N/A;    Current Medications: Current Meds  Medication Sig  . amiodarone (PACERONE) 200 MG tablet Take 1 tablet (200 mg total) by mouth daily.  Marland Kitchen apixaban (ELIQUIS) 5 MG TABS tablet Take 1 tablet (5 mg total) by mouth 2 (two) times daily.  Marland Kitchen atorvastatin (LIPITOR) 40 MG tablet  Take 1 tablet (40 mg total) by mouth daily at 6 PM.  . chlorhexidine (PERIDEX) 0.12 % solution SMARTSIG:1 Capful(s) By Mouth 3 Times Daily  . furosemide (LASIX) 80 MG tablet Take 1 tablet (80 mg total) by mouth every other day.  . metoprolol succinate (TOPROL-XL) 100 MG 24 hr tablet Take 1 tablet (100 mg total) by mouth daily. Take with or immediately following a meal.  . Multiple Vitamin (MULTIVITAMIN) tablet Take 1 tablet by mouth daily.  . sacubitril-valsartan (ENTRESTO) 97-103 MG Take 1 tablet by mouth 2  (two) times daily.  Marland Kitchen spironolactone (ALDACTONE) 25 MG tablet Take 1 tablet (25 mg total) by mouth daily.  . [DISCONTINUED] amiodarone (PACERONE) 200 MG tablet Take 1 tablet by mouth twice a day for 2 weeks then reduce to 1 tablet daily  . [DISCONTINUED] apixaban (ELIQUIS) 5 MG TABS tablet Take 1 tablet (5 mg total) by mouth 2 (two) times daily.  . [DISCONTINUED] atorvastatin (LIPITOR) 40 MG tablet Take 1 tablet (40 mg total) by mouth daily at 6 PM.  . [DISCONTINUED] furosemide (LASIX) 80 MG tablet Take 80 mg by mouth every other day.   . [DISCONTINUED] metoprolol succinate (TOPROL-XL) 100 MG 24 hr tablet Take 1 tablet (100 mg total) by mouth daily. Take with or immediately following a meal.  . [DISCONTINUED] sacubitril-valsartan (ENTRESTO) 97-103 MG Take 1 tablet by mouth 2 (two) times daily.  . [DISCONTINUED] spironolactone (ALDACTONE) 25 MG tablet Take 1 tablet (25 mg total) by mouth daily.     Allergies:    Patient has no known allergies.   Social History: Social History   Socioeconomic History  . Marital status: Single    Spouse name: Not on file  . Number of children: Not on file  . Years of education: Not on file  . Highest education level: High school graduate  Occupational History  . Not on file  Tobacco Use  . Smoking status: Never Smoker  . Smokeless tobacco: Never Used  Substance and Sexual Activity  . Alcohol use: No  . Drug use: No  . Sexual activity: Not on file  Other Topics Concern  . Not on file  Social History Narrative  . Not on file   Social Determinants of Health   Financial Resource Strain: Low Risk   . Difficulty of Paying Living Expenses: Not hard at all  Food Insecurity: No Food Insecurity  . Worried About Charity fundraiser in the Last Year: Never true  . Ran Out of Food in the Last Year: Never true  Transportation Needs: No Transportation Needs  . Lack of Transportation (Medical): No  . Lack of Transportation (Non-Medical): No  Physical  Activity: Unknown  . Days of Exercise per Week: 0 days  . Minutes of Exercise per Session: Not on file  Stress: No Stress Concern Present  . Feeling of Stress : Not at all  Social Connections:   . Frequency of Communication with Friends and Family: Not on file  . Frequency of Social Gatherings with Friends and Family: Not on file  . Attends Religious Services: Not on file  . Active Member of Clubs or Organizations: Not on file  . Attends Archivist Meetings: Not on file  . Marital Status: Not on file     Family History: The patient's family history includes High blood pressure in his mother; Kidney failure in his father.  ROS:   All other ROS reviewed and negative. Pertinent positives noted in the HPI.  EKGs/Labs/Other Studies Reviewed:   The following studies were personally reviewed by me today:  EKG:  EKG is ordered today.  The ekg ordered today demonstrates sinus tachycardia with PACs versus atrial fibrillation, there appears to be clear P waves but this could also represent atrial fibrillation, no acute ischemic changes, no evidence of prior infarction, and was personally reviewed by me.   Repeat EKG, heart rate 81, normal sinus rhythm with PACs versus short runs of atrial fibrillation  LHC 03/25/2019   1. Normal coronary anatomy 2. Moderately elevated LV filling pressures. PCWP and LVEDP of 23 mm Hg 3. Mild pulmonary venous HTN 4. Preserved cardiac output. Index 3.22.  TTE 03/22/2019 1. Left ventricular ejection fraction, by visual estimation, is <20%. The  left ventricle has severely decreased function. There is no left  ventricular hypertrophy.  2. Definity contrast agent was given IV to delineate the left ventricular  endocardial borders.  3. Moderately dilated left ventricular internal cavity size.  4. The left ventricle demonstrates global hypokinesis.  5. Left ventricular diastolic parameters are indeterminate.  6. LV thrombus excluded by echo  contrast.  7. Global right ventricle has severely reduced systolic function.The  right ventricular size is not well visualized. Right vetricular wall  thickness was not assessed.  8. Left atrial size was normal.  9. Right atrial size was normal.  10. Mild to moderate aortic valve annular calcification.  11. The mitral valve is normal in structure. Mild to moderate mitral valve  regurgitation.  12. The tricuspid valve is normal in structure. Tricuspid valve  regurgitation mild-moderate.  13. The aortic valve is tricuspid. Aortic valve regurgitation is not  visualized. No evidence of aortic valve sclerosis or stenosis.  14. The pulmonic valve was not well visualized. Pulmonic valve  regurgitation is not visualized.  15. Mildly elevated pulmonary artery systolic pressure.  16. The inferior vena cava is dilated in size with <50% respiratory  variability, suggesting right atrial pressure of 15 mmHg.   Recent Labs: 03/21/2019: B Natriuretic Peptide 261.6 03/22/2019: TSH 1.220 03/24/2019: ALT 15 03/27/2019: Hemoglobin 14.8; Magnesium 2.0; Platelets 276 06/10/2019: BUN 15; Creatinine, Ser 1.01; Potassium 4.2; Sodium 139   Recent Lipid Panel    Component Value Date/Time   CHOL 159 03/23/2019 1253   TRIG 105 03/23/2019 1253   HDL 32 (L) 03/23/2019 1253   CHOLHDL 5.0 03/23/2019 1253   VLDL 21 03/23/2019 1253   LDLCALC 106 (H) 03/23/2019 1253    Physical Exam:   VS:  BP (!) 142/84   Pulse (!) 117   Ht 5\' 11"  (1.803 m)   Wt (!) 317 lb (143.8 kg)   BMI 44.21 kg/m    Wt Readings from Last 3 Encounters:  07/01/19 (!) 317 lb (143.8 kg)  06/25/19 (!) 319 lb (144.7 kg)  06/10/19 (!) 321 lb 12.8 oz (146 kg)    General: Well nourished, well developed, in no acute distress Heart: Atraumatic, normal size  Eyes: PEERLA, EOMI  Neck: Supple, no JVD Endocrine: No thryomegaly Cardiac: Normal rhythm with runs of irregularity at times Lungs: Clear to auscultation bilaterally, no wheezing,  rhonchi or rales  Abd: Soft, nontender, no hepatomegaly  Ext: No edema, pulses 2+ Musculoskeletal: No deformities, BUE and BLE strength normal and equal Skin: Warm and dry, no rashes   Neuro: Alert and oriented to person, place, time, and situation, CNII-XII grossly intact, no focal deficits  Psych: Normal mood and affect   ASSESSMENT:   Parker Calderon is a 59  y.o. male who presents for the following: 1. Chronic systolic heart failure (HCC)   2. Persistent atrial fibrillation ([place])   3. Adequate anticoagulation on anticoagulant therapy   4. Essential hypertension     PLAN:   1. Chronic systolic heart failure ([place]), NYHA class II/III, ACC AHA stage C, nonischemic cardiopathy -Diagnosed November 2020.  In the setting of atrial fibrillation with RVR.  Normal coronaries.  Ejection fraction 10 to 15%. -He did undergo TEE cardioversion with short course of amiodarone therapy.  His atrial fibrillation has recurred. -Doing well on current medications.  We will continue metoprolol succinate 100 mg daily, Entresto 97-103 twice daily, Aldactone 25 mg daily -He will take Lasix 80 mg every other day -I have referred him to electrophysiology for cardiac ablation evaluation.  I do suspect A. fib had a lot to do with his heart failure.  We will evaluate for ablation. -I will plan to see him back in 2 months and we will plan to repeat an echocardiogram.  He appears to be doing well and I am hopeful for a recovery of his ejection fraction.   2. Persistent atrial fibrillation (HCC) -We will continue his amiodarone for now.  He will complete 200 mg twice daily for 2 weeks and then resume 200 mg daily.  We will have electrophysiology to weigh in on ablation. -Continue Eliquis 5 mg twice daily  3. Essential hypertension -Well-controlled on current medications   Disposition: Return in about 2 months (around 08/29/2019).  Medication Adjustments/Labs and Tests Ordered: Current medicines are reviewed at  length with the patient today.  Concerns regarding medicines are outlined above.  Orders Placed This Encounter  Procedures  . Ambulatory referral to Cardiac Electrophysiology  . EKG 12-Lead   Meds ordered this encounter  Medications  . amiodarone (PACERONE) 200 MG tablet    Sig: Take 1 tablet (200 mg total) by mouth daily.    Dispense:  60 tablet    Refill:  1  . apixaban (ELIQUIS) 5 MG TABS tablet    Sig: Take 1 tablet (5 mg total) by mouth 2 (two) times daily.    Dispense:  180 tablet    Refill:  3  . sacubitril-valsartan (ENTRESTO) 97-103 MG    Sig: Take 1 tablet by mouth 2 (two) times daily.    Dispense:  60 tablet    Refill:  11  . metoprolol succinate (TOPROL-XL) 100 MG 24 hr tablet    Sig: Take 1 tablet (100 mg total) by mouth daily. Take with or immediately following a meal.    Dispense:  90 tablet    Refill:  3  . atorvastatin (LIPITOR) 40 MG tablet    Sig: Take 1 tablet (40 mg total) by mouth daily at 6 PM.    Dispense:  90 tablet    Refill:  3  . spironolactone (ALDACTONE) 25 MG tablet    Sig: Take 1 tablet (25 mg total) by mouth daily.    Dispense:  90 tablet    Refill:  3  . furosemide (LASIX) 80 MG tablet    Sig: Take 1 tablet (80 mg total) by mouth every other day.    Dispense:  90 tablet    Refill:  0    Patient Instructions  Medication Instructions:  The current medical regimen is effective;  continue present plan and medications.  *If you need a refill on your cardiac medications before your next appointment, please call your pharmacy*  Follow-Up: At [facility], you  and your health needs are our priority.  As part of our continuing mission to provide you with exceptional heart care, we have created designated Provider Care Teams.  These Care Teams include your primary Cardiologist (physician) and Advanced Practice Providers (APPs -  Physician Assistants and Nurse Practitioners) who all work together to provide you with the care you need, when you  need it.  Your next appointment:   2 month(s)  The format for your next appointment:   In Person  Provider:   Eleonore Chiquito, MD  Other Instructions Referral to [facility].     Signed, Addison Naegeli. Audie Box, [place]  [address] [place], [place] [address] [phone]  07/01/2019 11:34 AM

## 2019-07-01 ENCOUNTER — Other Ambulatory Visit: Payer: Self-pay

## 2019-07-01 ENCOUNTER — Encounter (HOSPITAL_COMMUNITY)
Admission: RE | Admit: 2019-07-01 | Discharge: 2019-07-01 | Disposition: A | Discharge status: Home / Self Care | Payer: Self-pay | Source: Ambulatory Visit | Attending: Cardiovascular Disease | Admitting: Cardiovascular Disease

## 2019-07-01 ENCOUNTER — Ambulatory Visit (INDEPENDENT_AMBULATORY_CARE_PROVIDER_SITE_OTHER): Payer: BC Managed Care – PPO | Admitting: Cardiovascular Disease

## 2019-07-01 ENCOUNTER — Encounter: Payer: Self-pay | Admitting: Cardiovascular Disease

## 2019-07-01 VITALS — BP 142/84 | HR 117 | Ht 71.0 in | Wt 317.0 lb

## 2019-07-01 DIAGNOSIS — Z7901 Long term (current) use of anticoagulants: Secondary | ICD-10-CM | POA: Diagnosis not present

## 2019-07-01 DIAGNOSIS — I10 Essential (primary) hypertension: Secondary | ICD-10-CM

## 2019-07-01 DIAGNOSIS — I5022 Chronic systolic (congestive) heart failure: Secondary | ICD-10-CM

## 2019-07-01 DIAGNOSIS — I4819 Other persistent atrial fibrillation: Secondary | ICD-10-CM | POA: Diagnosis not present

## 2019-07-01 MED ORDER — SACUBITRIL-VALSARTAN 97-103 MG PO TABS: 1.0000 | ORAL_TABLET | Freq: Two times a day (BID) | ORAL | 11 refills | Status: DC

## 2019-07-01 MED ORDER — METOPROLOL SUCCINATE ER 100 MG PO TB24: 100.0000 mg | ORAL_TABLET | Freq: Every day | ORAL | 3 refills | Status: DC

## 2019-07-01 MED ORDER — FUROSEMIDE 80 MG PO TABS: 80.0000 mg | ORAL_TABLET | ORAL | 0 refills | Status: DC

## 2019-07-01 MED ORDER — APIXABAN 5 MG PO TABS: 5.0000 mg | ORAL_TABLET | Freq: Two times a day (BID) | ORAL | 3 refills | Status: DC

## 2019-07-01 MED ORDER — SPIRONOLACTONE 25 MG PO TABS: 25.0000 mg | ORAL_TABLET | Freq: Every day | ORAL | 3 refills | Status: DC

## 2019-07-01 MED ORDER — AMIODARONE HCL 200 MG PO TABS: 200.0000 mg | ORAL_TABLET | Freq: Every day | ORAL | 1 refills | Status: DC

## 2019-07-01 MED ORDER — ATORVASTATIN CALCIUM 40 MG PO TABS: 40.0000 mg | ORAL_TABLET | Freq: Every day | ORAL | 3 refills | Status: DC

## 2019-07-01 NOTE — Progress Notes (Signed)
Virtual Cardiac Rehab Note:  Incoming call from Parker Calderon. Parker Calderon wanting to discuss his completed cardiology appointment with Dr. Audie Box. Notes reviewed. Patient able to verbalize medical information provided during today's appointment and states he will see Dr. Curt Bears 07/19/19 for possible ablation. Parker Calderon states he has been instructed to continue his at home cardiac rehab plan of care and request in-person assistance with troubleshooting the [facility] Virtual Cardiac Rehab app. Patient states he will present to CR tomorrow am. Patient encouraged to continue to take medications as prescribe, follow a low sodium diet, check BP/pulse daily and PRN. Patient verbalized understanding and when to notify MD.  Parker Calderon. [name] RN, BSN [name]. [facility]  Cardiac and Pulmonary Rehabilitation [place] Direct: [phone]

## 2019-07-01 NOTE — Patient Instructions (Signed)
Medication Instructions:  The current medical regimen is effective;  continue present plan and medications.  *If you need a refill on your cardiac medications before your next appointment, please call your pharmacy*  Follow-Up: At [facility], you and your health needs are our priority.  As part of our continuing mission to provide you with exceptional heart care, we have created designated Provider Care Teams.  These Care Teams include your primary Cardiologist (physician) and Advanced Practice Providers (APPs -  Physician Assistants and Nurse Practitioners) who all work together to provide you with the care you need, when you need it.  Your next appointment:   2 month(s)  The format for your next appointment:   In Person  Provider:   Eleonore Chiquito, MD  Other Instructions Referral to [facility].

## 2019-07-02 ENCOUNTER — Encounter (HOSPITAL_COMMUNITY)
Admission: RE | Admit: 2019-07-02 | Discharge: 2019-07-02 | Disposition: A | Discharge status: Home / Self Care | Payer: Self-pay | Source: Ambulatory Visit | Attending: Cardiovascular Disease | Admitting: Cardiovascular Disease

## 2019-07-02 DIAGNOSIS — I5022 Chronic systolic (congestive) heart failure: Secondary | ICD-10-CM | POA: Insufficient documentation

## 2019-07-02 NOTE — Progress Notes (Signed)
Cardiac Rehab Note:  Parker Calderon presented to Cardiac Rehab today for in-person assistance with accessing the [facility] Virtual Cardiac Rehab app on his cell phone. App was successfully accessed and patient was provided a toutorial on use. Mr. Zobell also requested that vitals be taken. During his last face to face encounter Mr. Henrich was found to be in Afib with RVR and taken to the Afib clinic. VSS today. BP 112/62, HR irregular 60s-70s, O2 sat 98% RA. Patient denies complaints. Patient appreciative for assistance with accessing the VCR app and intends to utilize daily.  Plan: Will follow patient closely via [facility] app  Cherissa Hook E. [name] RN, BSN [place]. [facility]  Cardiac and Pulmonary Rehabilitation [place] Direct: [phone]

## 2019-07-05 ENCOUNTER — Inpatient Hospital Stay (HOSPITAL_COMMUNITY)
Admission: RE | Admit: 2019-07-05 | Discharge: 2019-07-05 | Disposition: A | Discharge status: Home / Self Care | Payer: BC Managed Care – PPO | Source: Ambulatory Visit

## 2019-07-05 ENCOUNTER — Other Ambulatory Visit: Payer: Self-pay

## 2019-07-05 NOTE — Progress Notes (Signed)
Virtual Cardiac Rehab Note  Successful phone encounter with Parker Calderon. RD sent him information on Friday about caffeine and other stimulants commonly found in supplement powders and energy drinks. Recommended Caffeine free diet mountain dew and opting for half caf coffee. 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 He drinks soda and coffee as main source of caffeine. He verbalized understanding of information sent. He was waiting at dentist office so we will continue conversation about caffeine and stimulants at f/u.   Michaele Offer, MS, RDN, LDN

## 2019-07-09 ENCOUNTER — Telehealth (HOSPITAL_COMMUNITY): Payer: Self-pay | Admitting: Family Medicine

## 2019-07-09 ENCOUNTER — Encounter (HOSPITAL_COMMUNITY)
Admission: RE | Admit: 2019-07-09 | Discharge: 2019-07-09 | Disposition: A | Discharge status: Home / Self Care | Payer: Self-pay | Source: Ambulatory Visit | Attending: Cardiovascular Disease | Admitting: Cardiovascular Disease

## 2019-07-09 ENCOUNTER — Other Ambulatory Visit: Payer: Self-pay

## 2019-07-09 DIAGNOSIS — I5022 Chronic systolic (congestive) heart failure: Secondary | ICD-10-CM | POA: Insufficient documentation

## 2019-07-09 NOTE — Progress Notes (Signed)
Virtual Cardiac Rehab Note:  Successful telephone encounter to Parker Calderon per his request. Mr. Pilgram would like to report his vital signs and discuss dental issue. Per Mr. Machnik, he had a filling "fall out" last evening and was concerned about needing a repair while taking Eliquis. He is concerned about bleeding. He is informed that his dental provider would decide if he needs to hold medications prior to procedure. He is also encouraged to discuss this with his cardiologist. He has notified his dentist and awaiting call back after 9am this morning. He left a message with the dental hygienist.   Mr. Falgout would also like to report his most recent BPs and HRs. Average BP 105-125/50s-60s. Resting HR 49-60. Asymptomatic. He continues his Amiodarone as prescribed. He is able to check his pulse and reports what he believes is a regular rhythm. Hx of Afib and recently re-prescribed Amio for RVR. He has resumed his home exercise (stationary bike riding) and denies symptoms with exercise. RN commended patient on his efforts to improve his health and decrease his HF symptoms. He also states he continues to follow CHF action plan including daily weights. He is able to appropriately verbalize when to notify his cardiologist with weight gain.  Plan: Will continue to follow patient via [facility] app  Nikos Anglemyer E. [name] RN, BSN [place]. [facility]  Cardiac and Pulmonary Rehabilitation [place] Direct: [phone]

## 2019-07-10 NOTE — Telephone Encounter (Signed)
Cardiac Rehab - Pharmacy Resident Documentation   Patient unable to be reached after three call attempts. Please complete allergy verification and medication review during patient's cardiac rehab appointment.     

## 2019-07-13 ENCOUNTER — Telehealth: Payer: Self-pay | Admitting: Cardiovascular Disease

## 2019-07-13 NOTE — Telephone Encounter (Signed)
I have informed Pt that we do not recommend holding anticoagulation for one tooth.  I will send to Dr. Remonia Richter fax # [phone]

## 2019-07-13 NOTE — Telephone Encounter (Signed)
Pharm do we hold eliquis for root canals?  If so please give recommendations.  Thanks.

## 2019-07-13 NOTE — Telephone Encounter (Signed)
   [facility] Pre-operative Risk Assessment    Request for surgical clearance:  1. What type of surgery is being performed? Root canal  2. When is this surgery scheduled? 07-27-19  3. What type of clearance is required (medical clearance vs. Pharmacy clearance to hold med vs. Both)? pharmacy  4. Are there any medications that need to be held prior to surgery and how long?Eliquis but pt wants to confirm duration prior to procedure  5. Practice name and name of physician performing surgery? Dr. Ardyth Gal Mickens  6. What is your office phone number: [phone]   7.   What is your office fax number: pt did not know  8.   Anesthesia type (None, local, MAC, general) ? Pt is not sure   Johnna Acosta 07/13/2019, 8:09 AM  _________________________________________________________________   (provider comments below)

## 2019-07-13 NOTE — Telephone Encounter (Signed)
Patient with diagnosis of ATRIAL FIBRILLATION on Eliquis for anticoagulation.    Procedure: root canal Date of procedure: 07/27/2019  CHADS2-VASc score of  2 (CHF, HTN)  CrCl > 50 ml/min   Per office protocol, NO NEED TO HOLD anticoagulation for single tooth root canal or single extraction.     Parker Calderon PharmD, BCPS, [place] [address] 07/13/2019 11:03 AM

## 2019-07-14 ENCOUNTER — Other Ambulatory Visit: Payer: Self-pay

## 2019-07-14 ENCOUNTER — Inpatient Hospital Stay (HOSPITAL_COMMUNITY)
Admission: RE | Admit: 2019-07-14 | Discharge: 2019-07-14 | Disposition: A | Discharge status: Home / Self Care | Payer: BC Managed Care – PPO | Source: Ambulatory Visit

## 2019-07-16 ENCOUNTER — Encounter (HOSPITAL_COMMUNITY)
Admission: RE | Admit: 2019-07-16 | Discharge: 2019-07-16 | Disposition: A | Discharge status: Home / Self Care | Payer: BC Managed Care – PPO | Source: Ambulatory Visit | Attending: Cardiovascular Disease | Admitting: Cardiovascular Disease

## 2019-07-16 DIAGNOSIS — I5022 Chronic systolic (congestive) heart failure: Secondary | ICD-10-CM | POA: Insufficient documentation

## 2019-07-16 NOTE — Progress Notes (Signed)
Cardiac Rehab: Virtual Visit  Patient participates in the virtual cardiac rehab program via the [facility] app. Patient called in response to chat message from the nurse case manager. I spoke with patient briefly about his exercise routine. Patient is riding his Echelon slim cycle and doing well with his routine. Patient is exercising 2-3 times/week for at least 30 minutes and using 6 lb. weights for his resistance exercise. Patient's cycle has arm bands attached, so he uses those some sessions instead of the 6 lb. weights. Patient read to me his most recent blood pressure readings and heart rates and they are within normal limits. He states his weight is staying between 313-316 lbs. Transferred patient to the nurse case manager to discuss her message further.  Will continue to follow.  Sol Passer, MS, ACSM CEP

## 2019-07-16 NOTE — Progress Notes (Signed)
Virtual Cardiac Rehab Note:  Spoke with patient regarding his ongoing participation in the [facility] VCR app. He continues to follow up exercise plan of care. He continue to check his vitals. HR remains rate controlled with average HR 60s-70s. Patient denies symptoms of fluid overload. He is commended for making such an effort to better his health. States he has a root canal scheduled for next week and has been cleared by cardiology to remain on his Eliquis. Patient concerned about bleeding. Encouraged him to discuss with dentist. Reassured patient that risk of thrombus outweighed bleeding risk during such procedure. Patient appreciative of reassurance.  Plan: will continue to follow  [place]. [name] RN, BSN [place]. [facility]  Cardiac and Pulmonary Rehabilitation [place] Direct: [phone]

## 2019-07-19 ENCOUNTER — Institutional Professional Consult (permissible substitution): Payer: BC Managed Care – PPO | Admitting: Cardiology

## 2019-07-25 DIAGNOSIS — I868 Varicose veins of other specified sites: Secondary | ICD-10-CM | POA: Diagnosis not present

## 2019-07-25 DIAGNOSIS — R600 Localized edema: Secondary | ICD-10-CM | POA: Diagnosis not present

## 2019-07-26 NOTE — Telephone Encounter (Signed)
Covering staff, please add missing info and # of tooth extraction.

## 2019-07-26 NOTE — Telephone Encounter (Addendum)
Patient with diagnosis of atrial fibrillation on Eliquis for anticoagulation.    Procedure: 1-2 root canals Date of procedure: 07/27/19  CHADS2-VASc score of 2 (CHF, HTN)  CrCl 160 ml/min using actual body weight (143.8 kg) and 114 ml/min using adjusted body weight (102.7 kg) Platelet count 276  Per office protocol, patient does not need to hold Eliquis prior to procedure.

## 2019-07-26 NOTE — Telephone Encounter (Signed)
Follow up    Pt calling stating that he is also having a tooth extracted along with the root canal. Pt was to call back with the numbers for the office. But he has not called back.     [facility] Pre-operative Risk Assessment    Request for surgical clearance:  1. What type of surgery is being performed? Extraction  2. When is this surgery scheduled? Not scheduled yet   3. What type of clearance is required (medical clearance vs. Pharmacy clearance to hold med vs. Both)? Pharmacy clearance to hold eliquis to have tooth pulled  4. Are there any medications that need to be held prior to surgery and how long? Pt calling to find out.   5. Practice name and name of physician performing surgery? [facility] or Dr. Conley Canal he's not sure which office he will have it done at    68. What is your office phone number-    7.   What is your office fax number- pt unsure  8.   Anesthesia type (None, local, MAC, general) ? Pt unsure   Alben Spittle 07/26/2019, 11:41 AM  _________________________________________________________________   (provider comments below)

## 2019-07-26 NOTE — Telephone Encounter (Signed)
Patient states he is returning call in regards to clearance for tooth extraction. He states he was unable to answer the last call. Please leave a message if the patient is not available.

## 2019-07-26 NOTE — Telephone Encounter (Signed)
I s/w Dr. Remonia Richter today in regards to dental procedure. Pt will not be having any teeth extracted. He will be having a root canal, possibly 2 root canal's. Local anesthesia to be used. Fax # [phone]. I will send new notes to pre op. Pt appt is tomorrow 07/27/19.

## 2019-07-27 ENCOUNTER — Other Ambulatory Visit: Payer: Self-pay

## 2019-07-27 ENCOUNTER — Encounter (HOSPITAL_COMMUNITY)
Admission: RE | Admit: 2019-07-27 | Discharge: 2019-07-27 | Disposition: A | Discharge status: Home / Self Care | Payer: BC Managed Care – PPO | Source: Ambulatory Visit | Attending: Internal Medicine | Admitting: Internal Medicine

## 2019-07-27 DIAGNOSIS — I5022 Chronic systolic (congestive) heart failure: Secondary | ICD-10-CM | POA: Insufficient documentation

## 2019-07-27 NOTE — Telephone Encounter (Signed)
[facility] Phone [phone] Fax [phone]

## 2019-07-27 NOTE — Telephone Encounter (Signed)
Follow up  Pt returning call regarding clearance for his tooth extraction. Clearance was sent yesterday from [facility]. He would like to get follow up on it if he needs to hold his eliquis before the extraction. Pt said if he didn't answer to leave him a message  Please advise

## 2019-07-27 NOTE — Progress Notes (Signed)
Virtual Cardiac Rehab Note  Pt called cardiac rehab to speak with RD. He discussed his medical care over the past month related to his heart and dental work. We briefly discussed his diet and exercise. He mentioned skipping some days of exercise due to hectic schedule. Call interrupted by a phone call to pt. He asked for a call back. RD scheduled a Friday March 12th virtual visit. Will continue to monitor pt via phone calls and [facility] app for virtual cardiac rehab.  Michaele Offer, MS, RDN, LDN

## 2019-07-27 NOTE — Telephone Encounter (Signed)
   Primary Cardiologist:[place] Doreatha Lew, MD  Chart reviewed as part of pre-operative protocol coverage. Pre-op clearance already addressed by colleagues in earlier phone notes. To summarize recommendations:  Parker Calderon has requested input on holding his Eliquis for 2 separate dental procedures, root canal and extraction of 1 tooth.  Per office protocol, NO NEED TO HOLD anticoagulation for single tooth root canal or single extraction.   It is generally accepted that for simple extractions and dental cleanings, there is no need to interrupt blood thinner therapy.   This recommendation has been sent to Dr. Remonia Richter' office and I will send it to [place] via [place] fax function per patient request and remove from pre-op pool.  Please call with questions.  Daune Perch, NP 07/27/2019, 8:30 AM

## 2019-07-30 ENCOUNTER — Other Ambulatory Visit: Payer: Self-pay

## 2019-07-30 ENCOUNTER — Inpatient Hospital Stay (HOSPITAL_COMMUNITY)
Admission: RE | Admit: 2019-07-30 | Discharge: 2019-07-30 | Disposition: A | Discharge status: Home / Self Care | Payer: BC Managed Care – PPO | Source: Ambulatory Visit

## 2019-07-30 NOTE — Progress Notes (Signed)
Nutrition Note: Virtual Visit  Spoke with pt for virtual cardiac rehab. Pt took a few days off of exercise due to dental procedures. He says he is starting back today.  He mentioned his weight loss goals. He has plateaud  At 318 lbs. Recommended eliminating sugary beverages. He is thinking about adding protein shakes. We reviewed label reading.  Diet recall reviewed. Nutrition goals reviewed. Will continue to monitor pt with phone calls for virtual cardiac rehab.   Michaele Offer, MS, RDN, LDN

## 2019-08-03 ENCOUNTER — Telehealth (HOSPITAL_COMMUNITY): Payer: Self-pay

## 2019-08-03 NOTE — Telephone Encounter (Signed)
Virtual Cardiac Rehab Note:  Successful telephone encounter with Parker Calderon to schedule follow up appointment and 6 min walk test post VCR program completion. Parker Calderon will be discharged from the [facility] App after completing 13 weeks of a hybrid CR program including onsite and virtual exercise. 6 min walk test scheduled for 08/09/19 at 8:00. Covid-19 screening negative.  Parker Mun E. [name] RN, BSN [place]. [facility]  Cardiac and Pulmonary Rehabilitation Phone: [phone] Fax: [phone]

## 2019-08-05 ENCOUNTER — Ambulatory Visit: Payer: BC Managed Care – PPO

## 2019-08-09 ENCOUNTER — Other Ambulatory Visit: Payer: Self-pay

## 2019-08-09 ENCOUNTER — Encounter (HOSPITAL_COMMUNITY)
Admission: RE | Admit: 2019-08-09 | Discharge: 2019-08-09 | Disposition: A | Discharge status: Home / Self Care | Payer: BC Managed Care – PPO | Source: Ambulatory Visit | Attending: Cardiovascular Disease | Admitting: Cardiovascular Disease

## 2019-08-09 VITALS — BP 132/76 | HR 83 | Temp 97.2°F | Ht 69.0 in | Wt 316.1 lb

## 2019-08-09 DIAGNOSIS — I5022 Chronic systolic (congestive) heart failure: Secondary | ICD-10-CM

## 2019-08-09 NOTE — Progress Notes (Signed)
Discharge Progress Report  Patient Details  Name: Parker Calderon MRN: [id number] Date of Birth: [birth year] Referring Provider:     [place] from 04/06/2019 in [place]  Referring Provider  O'Neal, Cassie Freer, MD       Number of Visits: 13 in person exercise sessions/ 13 weeks VCR utilizing [facility] App  Reason for Discharge:  Patient reached a stable level of exercise. Patient independent in their exercise. Patient has met program and personal goals.  Smoking History:  Social History   Tobacco Use  Smoking Status Never Smoker  Smokeless Tobacco Never Used    Diagnosis:  Heart failure, chronic systolic ([place])  ADL UCSD:   Initial Exercise Prescription: Initial Exercise Prescription - 04/06/19 1200      Date of Initial Exercise RX and Referring Provider   Date  04/06/19    Referring Provider  Geralynn Rile, MD    Expected Discharge Date  06/04/19      Treadmill   MPH  2    Grade  0    Minutes  15    METs  2.53      NuStep   Level  3    SPM  85    Minutes  15    METs  2.5      Prescription Details   Frequency (times per week)  3    Duration  Progress to 30 minutes of continuous aerobic without signs/symptoms of physical distress      Intensity   THRR 40-80% of Max Heartrate  64-129    Ratings of Perceived Exertion  11-13    Perceived Dyspnea  0-4      Progression   Progression  Continue to progress workloads to maintain intensity without signs/symptoms of physical distress.      Resistance Training   Training Prescription  Yes    Weight  5lbs    Reps  10-15       Discharge Exercise Prescription (Final Exercise Prescription Changes): Exercise Prescription Changes - 06/16/19 1117      Response to Exercise   Blood Pressure (Admit)  107/55    Blood Pressure (Exercise)  104/42    Heart Rate (Admit)  65 bpm    Heart Rate (Exercise)  89 bpm    Rating of Perceived Exertion  (Exercise)  13    Symptoms  None    Comments  Virtual cardiac rehab session.    Duration  Continue with 30 min of aerobic exercise without signs/symptoms of physical distress.    Intensity  THRR unchanged      Progression   Progression  Continue to progress workloads to maintain intensity without signs/symptoms of physical distress.      Resistance Training   Training Prescription  Yes    Weight  6lbs    Reps  10-15    Time  10 Minutes      Interval Training   Interval Training  No      Treadmill   MPH  --    Grade  --    Minutes  --    METs  --      Bike   Minutes  30      NuStep   Level  --    SPM  --    Minutes  --    METs  --      Home Exercise Plan   Plans to continue exercise at  Home (comment)    Frequency  Add 3 additional days to program exercise sessions.    Initial Home Exercises Provided  05/03/19       Functional Capacity: 6 Minute Walk    Row Name 04/06/19 0947 08/09/19 0818       6 Minute Walk   Phase  Initial  Discharge    Distance  1400 feet  1539 feet    Distance % Change  --  9.93 %    Distance Feet Change  --  139 ft    Walk Time  6 minutes  6 minutes    # of Rest Breaks  0  0    MPH  2.65  2.91    METS  2.61  2.95    RPE  11  12    Perceived Dyspnea   0  0    VO2 Peak  9.13  10.32    Symptoms  No  No    Resting HR  86 bpm  83 bpm    Resting BP  120/84  132/76    Resting Oxygen Saturation   98 %  --    Exercise Oxygen Saturation  during 6 min walk  96 %  --    Max Ex. HR  104 bpm  112 bpm    Max Ex. BP  148/76  138/82    2 Minute Post BP  132/82  128/82       Psychological, QOL, Others - Outcomes: PHQ 2/9: Depression screen PHQ 2/9 04/06/2019  Decreased Interest 0  Down, Depressed, Hopeless 0  PHQ - 2 Score 0    Quality of Life: Quality of Life - 08/09/19 1630      Quality of Life   Select  Quality of Life      Quality of Life Scores   Health/Function Pre  23.8 %    Health/Function Post  27 %    Health/Function %  Change  13.45 %    Socioeconomic Pre  24.14 %    Socioeconomic Post  21.19 %    Socioeconomic % Change   -12.22 %    Psych/Spiritual Pre  24 %    Psych/Spiritual Post  24.14 %    Psych/Spiritual % Change  0.58 %    Family Pre  25.9 %    Family Post  28.2 %    Family % Change  8.88 %    GLOBAL Pre  24.22 %    GLOBAL Post  25.27 %    GLOBAL % Change  4.34 %       Personal Goals: Goals established at orientation with interventions provided to work toward goal. Personal Goals and Risk Factors at Admission - 04/06/19 1216      Core Components/Risk Factors/Patient Goals on Admission    Weight Management  Yes;Obesity    Intervention  Weight Management/Obesity: Establish reasonable short term and long term weight goals.;Obesity: Provide education and appropriate resources to help participant work on and attain dietary goals.    Admit Weight  326 lb 4.5 oz (148 kg)    Goal Weight: Short Term  320 lb (145.2 kg)    Goal Weight: Long Term  226 lb (102.5 kg)    Expected Outcomes  Short Term: Continue to assess and modify interventions until short term weight is achieved;Long Term: Adherence to nutrition and physical activity/exercise program aimed toward attainment of established weight goal;Weight Loss: Understanding of general recommendations for a balanced deficit meal plan,  which promotes 1-2 lb weight loss per week and includes a negative energy balance of [id number] kcal/d;Understanding of distribution of calorie intake throughout the day with the consumption of 4-5 meals/snacks    Heart Failure  Yes    Intervention  Provide a combined exercise and nutrition program that is supplemented with education, support and counseling about heart failure. Directed toward relieving symptoms such as shortness of breath, decreased exercise tolerance, and extremity edema.    Expected Outcomes  Improve functional capacity of life;Short term: Attendance in program 2-3 days a week with increased exercise  capacity. Reported lower sodium intake. Reported increased fruit and vegetable intake. Reports medication compliance.;Short term: Daily weights obtained and reported for increase. Utilizing diuretic protocols set by physician.;Long term: Adoption of self-care skills and reduction of barriers for early signs and symptoms recognition and intervention leading to self-care maintenance.    Hypertension  Yes    Intervention  Provide education on lifestyle modifcations including regular physical activity/exercise, weight management, moderate sodium restriction and increased consumption of fresh fruit, vegetables, and low fat dairy, alcohol moderation, and smoking cessation.;Monitor prescription use compliance.    Expected Outcomes  Short Term: Continued assessment and intervention until BP is < 140/44m HG in hypertensive participants. < 130/892mHG in hypertensive participants with diabetes, heart failure or chronic kidney disease.;Long Term: Maintenance of blood pressure at goal levels.    Lipids  Yes    Intervention  Provide education and support for participant on nutrition & aerobic/resistive exercise along with prescribed medications to achieve LDL <7093mHDL >35m14m  Expected Outcomes  Short Term: Participant states understanding of desired cholesterol values and is compliant with medications prescribed. Participant is following exercise prescription and nutrition guidelines.;Long Term: Cholesterol controlled with medications as prescribed, with individualized exercise RX and with personalized nutrition plan. Value goals: LDL < 70mg74mL > 40 mg.        Personal Goals Discharge: Goals and Risk Factor Review    Row Name 04/12/19 1559 04/29/19 1526 05/13/19 1027 06/23/19 1603 06/23/19 1609     Core Components/Risk Factors/Patient Goals Review   Personal Goals Review  Weight Management/Obesity;Lipids;Hypertension;Heart Failure  Weight Management/Obesity;Lipids;Hypertension;Heart Failure  Weight  Management/Obesity;Lipids;Hypertension;Heart Failure  Weight Management/Obesity;Lipids;Hypertension;Heart Failure  Weight Management/Obesity;Lipids;Hypertension;Heart Failure   Review  Pt with multiple CAD RFs eager to participate in CR exercise.  Onyx Vimald like to lose weight.  Pt with multiple CAD RFs eager to participate in CR exercise.  Buryl Jilbertobeen doing well with exercise. Kurtiss's vital signs have been stable. Waylan Lloydbeen enthusiastic and motivated  Pt with multiple CAD RFs eager to participate in CR exercise.  Sinjin Schonbeen doing well with exercise. Gailen's vital signs have been stable. Reeder Yacobbeen enthusiastic and motivated  --  Endre Jefersonbeen partcipating in virtual cardiac rehab   Expected Outcomes  Aaden Brandol continue to participate in CR exercise, nutrition, and lifestyle modification opportunities.  Geoffry Beacher continue to participate in CR exercise, nutrition, and lifestyle modification opportunities.  Eashan Kyrillos continue to participate in CR exercise, nutrition, and lifestyle modification opportunities.Navon plans to transition to virtual cardiac rehab when in person cardiac rehab pauses due to the COVID 19 pandemic  --  In Person phase 2 cardiac rehab is resuming on 07/05/19      Exercise Goals and Review: Exercise Goals    Row Name 04/06/19 1008             Exercise Goals   Increase Physical Activity  Yes  Intervention  Provide advice, education, support and counseling about physical activity/exercise needs.;Develop an individualized exercise prescription for aerobic and resistive training based on initial evaluation findings, risk stratification, comorbidities and participant's personal goals.       Expected Outcomes  Short Term: Attend rehab on a regular basis to increase amount of physical activity.;Long Term: Exercising regularly at least 3-5 days a week.;Long Term: Add in home exercise to make exercise part of routine and to increase amount of physical activity.        Increase Strength and Stamina  Yes       Intervention  Provide advice, education, support and counseling about physical activity/exercise needs.;Develop an individualized exercise prescription for aerobic and resistive training based on initial evaluation findings, risk stratification, comorbidities and participant's personal goals.       Expected Outcomes  Short Term: Increase workloads from initial exercise prescription for resistance, speed, and METs.;Short Term: Perform resistance training exercises routinely during rehab and add in resistance training at home;Long Term: Improve cardiorespiratory fitness, muscular endurance and strength as measured by increased METs and functional capacity (6MWT)       Able to understand and use rate of perceived exertion (RPE) scale  Yes       Intervention  Provide education and explanation on how to use RPE scale       Expected Outcomes  Short Term: Able to use RPE daily in rehab to express subjective intensity level;Long Term:  Able to use RPE to guide intensity level when exercising independently       Knowledge and understanding of Target Heart Rate Range (THRR)  Yes       Intervention  Provide education and explanation of THRR including how the numbers were predicted and where they are located for reference       Expected Outcomes  Short Term: Able to state/look up THRR;Long Term: Able to use THRR to govern intensity when exercising independently;Short Term: Able to use daily as guideline for intensity in rehab       Able to check pulse independently  Yes       Intervention  Provide education and demonstration on how to check pulse in carotid and radial arteries.;Review the importance of being able to check your own pulse for safety during independent exercise       Expected Outcomes  Short Term: Able to explain why pulse checking is important during independent exercise;Long Term: Able to check pulse independently and accurately       Understanding of Exercise  Prescription  Yes       Intervention  Provide education, explanation, and written materials on patient's individual exercise prescription       Expected Outcomes  Short Term: Able to explain program exercise prescription;Long Term: Able to explain home exercise prescription to exercise independently          Exercise Goals Re-Evaluation: Exercise Goals Re-Evaluation    Row Name 04/12/19 1457 04/29/19 1327 05/03/19 1034 05/20/19 1320 06/28/19 1113     Exercise Goal Re-Evaluation   Exercise Goals Review  Increase Physical Activity;Increase Strength and Stamina;Able to understand and use rate of perceived exertion (RPE) scale;Knowledge and understanding of Target Heart Rate Range (THRR);Understanding of Exercise Prescription  Increase Physical Activity;Increase Strength and Stamina;Able to understand and use rate of perceived exertion (RPE) scale;Knowledge and understanding of Target Heart Rate Range (THRR);Able to check pulse independently;Understanding of Exercise Prescription  Increase Physical Activity;Increase Strength and Stamina;Able to understand and use rate of perceived exertion (  RPE) scale;Knowledge and understanding of Target Heart Rate Range (THRR);Able to check pulse independently;Understanding of Exercise Prescription  Increase Physical Activity;Increase Strength and Stamina;Able to understand and use rate of perceived exertion (RPE) scale;Knowledge and understanding of Target Heart Rate Range (THRR);Able to check pulse independently;Understanding of Exercise Prescription  Increase Physical Activity;Increase Strength and Stamina;Able to understand and use rate of perceived exertion (RPE) scale;Knowledge and understanding of Target Heart Rate Range (THRR);Able to check pulse independently;Understanding of Exercise Prescription   Comments  Pt first day of exercise in CR program. Pt tolerated exercise Rx well. Pt understands THRR, RPE scale, and exercise Rx.  Pt is progressing well in the  prorgram. Pt continues to increase workloads and MET levels. Pt MET level is 2.6. Pt is exercising at home by walking 2-4 days per week in addition to CR program.  Reviewed HEP with Pt. Pt understands exercise goals, RPE scale, THRR, end points of exercise, and weather precatuions. Pt stated he has purchased a stationary bike that he will begin to use. Pt stated he would use the bike 2-4 days per week for 30-45 minutes in addition to CR program.  Pt is progressing well in the program and has a MET level of 3.0. Pt completed 13 exercise sessions. Pt was offered the virutal CR program due to department clousre for [place]. Pt wants to participate in virtual CR program and is active in the app. He will start logging his exercise sessions on the app. Pt was encourgaed to exercise 5-7 days per week for 30-45 minutes per session.  Patient has been actively participating in the virtual cardiac rehab program via the [facility] app and has been progressing well. Patient is riding his stationary bike 30 minutes, 3-4 days/week and using 6lb. weights for his resistance exercises.   Expected Outcomes  Will continue to monitor and progress Pt as tolerated.  Will continue to monitor and progress Pt as tolerated.  Will continue to monitor and progress Pt as tolerated.  Pt will continue to exercise at home.  Patient will continue exercise in the virtual cardiac rehab program.   [place] Name 08/09/19 0818             Exercise Goal Re-Evaluation   Exercise Goals Review  Increase Physical Activity;Increase Strength and Stamina;Able to understand and use rate of perceived exertion (RPE) scale;Knowledge and understanding of Target Heart Rate Range (THRR);Able to check pulse independently;Understanding of Exercise Prescription       Comments  Patient returned onsite today for discharge walk test and measurements. Patient's functional capacity improved 10% as measured by 6MWT and strength increased 29% as measured by grip strength  test. Patient aslo lost 4.6 kg. Patient is exercising on his stationary bike at home and will continue his current routine.       Expected Outcomes  Patient will continue current exercise routine to help maintain health and fitness gains.          Nutrition & Weight - Outcomes: Pre Biometrics - 04/06/19 0913      Pre Biometrics   Height  '5\' 9"'  (1.753 m)    Waist Circumference  52.25 inches    Hip Circumference  56.5 inches    Waist to Hip Ratio  0.92 %    BMI (Calculated)  48.16    Triceps Skinfold  42 mm    % Body Fat  45 %    Grip Strength  42.5 kg    Flexibility  14.75 in  Single Leg Stand  2.25 seconds      Post Biometrics - 08/09/19 0818       Post  Biometrics   Height  '5\' 9"'  (1.753 m)    Weight  (!) 143.4 kg    Waist Circumference  51.25 inches    Hip Circumference  54.25 inches    Waist to Hip Ratio  0.94 %    BMI (Calculated)  46.66    Triceps Skinfold  40 mm    % Body Fat  43.8 %    Grip Strength  55 kg    Flexibility  15 in    Single Leg Stand  2.37 seconds       Nutrition: Nutrition Therapy & Goals - 04/14/19 1054      Nutrition Therapy   Diet  Low Sodium, Carb modified    Drug/Food Interactions  Statins/Certain Fruits      Personal Nutrition Goals   Nutrition Goal  Pt to identify food quantities necessary to achieve weight loss of 6-24 lb at graduation from cardiac rehab.    Personal Goal #2  Pt able to name foods that affect blood glucose    Personal Goal #3  Pt to identify and limit food sources of saturated fat, trans fat, refined carbohydrates and sodium      Intervention Plan   Intervention  Prescribe, educate and counsel regarding individualized specific dietary modifications aiming towards targeted core components such as weight, hypertension, lipid management, diabetes, heart failure and other comorbidities.;Nutrition handout(s) given to patient.    Expected Outcomes  Short Term Goal: Understand basic principles of dietary content, such as  calories, fat, sodium, cholesterol and nutrients.;Short Term Goal: A plan has been developed with personal nutrition goals set during dietitian appointment.;Long Term Goal: Adherence to prescribed nutrition plan.       Nutrition Discharge: Nutrition Assessments - 08/09/19 0937      MEDFICTS Scores   Post Score  51       Education Questionnaire Score: Knowledge Questionnaire Score - 08/09/19 1633      Knowledge Questionnaire Score   Pre Score  20/24    Post Score  20/24       Goals reviewed with patient. He is pleased with his positive progress towards managing his cardiovascular health and decreasing his risk factors through lifestyle modifications. He plans to continue his home exercise by walking and riding his stationary bike.

## 2019-08-20 ENCOUNTER — Encounter (HOSPITAL_COMMUNITY)
Admission: RE | Admit: 2019-08-20 | Discharge: 2019-08-20 | Disposition: A | Discharge status: Home / Self Care | Payer: BC Managed Care – PPO | Source: Ambulatory Visit | Attending: Cardiovascular Disease | Admitting: Cardiovascular Disease

## 2019-08-20 ENCOUNTER — Ambulatory Visit (INDEPENDENT_AMBULATORY_CARE_PROVIDER_SITE_OTHER): Payer: BC Managed Care – PPO | Admitting: Cardiology

## 2019-08-20 ENCOUNTER — Other Ambulatory Visit: Payer: Self-pay

## 2019-08-20 ENCOUNTER — Encounter: Payer: Self-pay | Admitting: Cardiology

## 2019-08-20 VITALS — BP 154/78 | HR 64 | Ht 71.0 in | Wt 319.0 lb

## 2019-08-20 DIAGNOSIS — I5022 Chronic systolic (congestive) heart failure: Secondary | ICD-10-CM | POA: Insufficient documentation

## 2019-08-20 DIAGNOSIS — I4819 Other persistent atrial fibrillation: Secondary | ICD-10-CM

## 2019-08-20 NOTE — Progress Notes (Signed)
Cardiac Rehab: Virtual Visit  TEMPLE SCHANKE has completed the virtual cardiac rehab program and will continue exercise independently at this time. Patient states that he had not been consistently exercising recently because of some dental work he had but plans to resume riding his stationary bike. Patient plans to ride his bike 30 minutes 3 days/week to start. I reviewed exercise guidelines with patient including stretching before and after exercise, target heart rate range and endpoints for exercise. Patient has 6 lb. weights that he is using for his resistance training. Reiterated the importance of daily weights, low sodium diet, and when to call EMS or his physician. Patient verbalizes understanding of information given. We discussed making a goal to increase frequency of exercise 1-2 more days/week once he gets back into his exercise routine, and patient is amenable to this. Patient also plans to join the [employer] near his house.  Sol Passer, MS, ACSM [place] Do[place] 08/20/2019 [phone]

## 2019-08-20 NOTE — Patient Instructions (Addendum)
Medication Instructions:  Your physician recommends that you continue on your current medications as directed. Please refer to the Current Medication list given to you today.  *If you need a refill on your cardiac medications before your next appointment, please call your pharmacy*   Lab Work: None ordered If you have labs (blood work) drawn today and your tests are completely normal, you will receive your results only by: Marland Kitchen MyChart Message (if you have MyChart) OR . A paper copy in the mail If you have any lab test that is abnormal or we need to change your treatment, we will call you to review the results.   Testing/Procedures: None ordered   Follow-Up: At [facility], you and your health needs are our priority.  As part of our continuing mission to provide you with exceptional heart care, we have created designated Provider Care Teams.  These Care Teams include your primary Cardiologist (physician) and Advanced Practice Providers (APPs -  Physician Assistants and Nurse Practitioners) who all work together to provide you with the care you need, when you need it.  We recommend signing up for the patient portal called "MyChart".  Sign up information is provided on this After Visit Summary.  MyChart is used to connect with patients for Virtual Visits (Telemedicine).  Patients are able to view lab/test results, encounter notes, upcoming appointments, etc.  Non-urgent messages can be sent to your provider as well.   To learn more about what you can do with MyChart, go to [email or web address].    Your next appointment:   3 month(s)  The format for your next appointment:   In Person  Provider:   Allegra Lai, MD   Thank you for choosing [facility]!!   Trinidad Curet, RN [phone]    Other Instructions  Please call the office if you would like to go ahead and schedule an ablation prior to your follow up in several months.   Cardiac Ablation Cardiac ablation is a  procedure to disable (ablate) a small amount of heart tissue in very specific places. The heart has many electrical connections. Sometimes these connections are abnormal and can cause the heart to beat very fast or irregularly. Ablating some of the problem areas can improve the heart rhythm or return it to normal. Ablation may be done for people who:  Have Wolff-Parkinson-White syndrome.  Have fast heart rhythms (tachycardia).  Have taken medicines for an abnormal heart rhythm (arrhythmia) that were not effective or caused side effects.  Have a high-risk heartbeat that may be life-threatening. During the procedure, a small incision is made in the neck or the groin, and a long, thin, flexible tube (catheter) is inserted into the incision and moved to the heart. Small devices (electrodes) on the tip of the catheter will send out electrical currents. A type of X-ray (fluoroscopy) will be used to help guide the catheter and to provide images of the heart. Tell a health care provider about:  Any allergies you have.  All medicines you are taking, including vitamins, herbs, eye drops, creams, and over-the-counter medicines.  Any problems you or family members have had with anesthetic medicines.  Any blood disorders you have.  Any surgeries you have had.  Any medical conditions you have, such as kidney failure.  Whether you are pregnant or may be pregnant. What are the risks? Generally, this is a safe procedure. However, problems may occur, including:  Infection.  Bruising and bleeding at the catheter insertion site.  Bleeding into  the chest, especially into the sac that surrounds the heart. This is a serious complication.  Stroke or blood clots.  Damage to other structures or organs.  Allergic reaction to medicines or dyes.  Need for a permanent pacemaker if the normal electrical system is damaged. A pacemaker is a small computer that sends electrical signals to the heart and helps  your heart beat normally.  The procedure not being fully effective. This may not be recognized until months later. Repeat ablation procedures are sometimes required. What happens before the procedure?  Follow instructions from your health care provider about eating or drinking restrictions.  Ask your health care provider about: ? Changing or stopping your regular medicines. This is especially important if you are taking diabetes medicines or blood thinners. ? Taking medicines such as aspirin and ibuprofen. These medicines can thin your blood. Do not take these medicines before your procedure if your health care provider instructs you not to.  Plan to have someone take you home from the hospital or clinic.  If you will be going home right after the procedure, plan to have someone with you for 24 hours. What happens during the procedure?  To lower your risk of infection: ? Your health care team will wash or sanitize their hands. ? Your skin will be washed with soap. ? Hair may be removed from the incision area.  An IV tube will be inserted into one of your veins.  You will be given a medicine to help you relax (sedative).  The skin on your neck or groin will be numbed.  An incision will be made in your neck or your groin.  A needle will be inserted through the incision and into a large vein in your neck or groin.  A catheter will be inserted into the needle and moved to your heart.  Dye may be injected through the catheter to help your surgeon see the area of the heart that needs treatment.  Electrical currents will be sent from the catheter to ablate heart tissue in desired areas. There are three types of energy that may be used to ablate heart tissue: ? Heat (radiofrequency energy). ? Laser energy. ? Extreme cold (cryoablation).  When the necessary tissue has been ablated, the catheter will be removed.  Pressure will be held on the catheter insertion area to prevent  excessive bleeding.  A bandage (dressing) will be placed over the catheter insertion area. The procedure may vary among health care providers and hospitals. What happens after the procedure?  Your blood pressure, heart rate, breathing rate, and blood oxygen level will be monitored until the medicines you were given have worn off.  Your catheter insertion area will be monitored for bleeding. You will need to lie still for a few hours to ensure that you do not bleed from the catheter insertion area.  Do not drive for 24 hours or as long as directed by your health care provider. Summary  Cardiac ablation is a procedure to disable (ablate) a small amount of heart tissue in very specific places. Ablating some of the problem areas can improve the heart rhythm or return it to normal.  During the procedure, electrical currents will be sent from the catheter to ablate heart tissue in desired areas. This information is not intended to replace advice given to you by your health care provider. Make sure you discuss any questions you have with your health care provider. Document Revised: 10/27/2017 Document Reviewed: 03/25/2016 Elsevier Patient Education  [place].

## 2019-08-20 NOTE — Progress Notes (Signed)
Electrophysiology Office Note   Date:  08/20/2019   ID:  Parker Calderon, DOB 1961-02-27, MRN [id number]  PCP:  Shirline Frees, MD  Cardiologist:  Farris Has Primary Electrophysiologist:  Izetta Sakamoto Meredith Leeds, MD    Chief Complaint: AF, CHF   History of Present Illness: Parker Calderon is a 59 y.o. male who is being seen today for the evaluation of AF, CHF at the request of Geralynn Rile, *. Presenting today for electrophysiology evaluation.  He has a history of systolic heart failure, atrial fibrillation, morbid obesity.  He is currently on amiodarone.  It is thought that his atrial fibrillation was partially the cause of his systolic heart failure.  He presented to the hospital quite a bit of congestion.  He felt quite short of breath and could not walk across the room.  He was diuresed and cardioverted and felt much better.  He did represent to A. fib clinic 06/25/2019 in atrial fibrillation.  His amiodarone dose was adjusted.  He converted back to sinus rhythm.  Today, he denies symptoms of palpitations, chest pain, shortness of breath, orthopnea, PND, lower extremity edema, claudication, dizziness, presyncope, syncope, bleeding, or neurologic sequela. The patient is tolerating medications without difficulties.    Past Medical History:  Diagnosis Date  . Arthritis    Rhinitis  . Atrial fibrillation ([place])   . CHF (congestive heart failure) ([place])   . Hyperlipidemia   . Hypertension   . Irritable bowel syndrome   . Left thigh pain    Lateral, aching  . Obesity, unspecified   . Varicose veins    Past Surgical History:  Procedure Laterality Date  . CARDIAC CATHETERIZATION    . CARDIOVERSION N/A 03/26/2019   Procedure: CARDIOVERSION;  Surgeon: Josue Hector, MD;  Location: [facility] ENDOSCOPY;  Service: Cardiovascular;  Laterality: N/A;  . RIGHT/LEFT HEART CATH AND CORONARY ANGIOGRAPHY N/A 03/25/2019   Procedure: RIGHT/LEFT HEART CATH AND CORONARY ANGIOGRAPHY;  Surgeon: Martinique, Peter  M, MD;  Location: [facility] CV LAB;  Service: Cardiovascular;  Laterality: N/A;  . TEE WITHOUT CARDIOVERSION N/A 03/26/2019   Procedure: TRANSESOPHAGEAL ECHOCARDIOGRAM (TEE);  Surgeon: Josue Hector, MD;  Location: [facility] ENDOSCOPY;  Service: Cardiovascular;  Laterality: N/A;     Current Outpatient Medications  Medication Sig Dispense Refill  . amiodarone (PACERONE) 200 MG tablet Take 1 tablet (200 mg total) by mouth daily. 60 tablet 1  . apixaban (ELIQUIS) 5 MG TABS tablet Take 1 tablet (5 mg total) by mouth 2 (two) times daily. 180 tablet 3  . atorvastatin (LIPITOR) 40 MG tablet Take 1 tablet (40 mg total) by mouth daily at 6 PM. 90 tablet 3  . chlorhexidine (PERIDEX) 0.12 % solution SMARTSIG:1 Capful(s) By Mouth 3 Times Daily    . furosemide (LASIX) 80 MG tablet Take 1 tablet (80 mg total) by mouth every other day. 90 tablet 0  . metoprolol succinate (TOPROL-XL) 100 MG 24 hr tablet Take 1 tablet (100 mg total) by mouth daily. Take with or immediately following a meal. 90 tablet 3  . Multiple Vitamin (MULTIVITAMIN) tablet Take 1 tablet by mouth daily.    . sacubitril-valsartan (ENTRESTO) 97-103 MG Take 1 tablet by mouth 2 (two) times daily. 60 tablet 11  . spironolactone (ALDACTONE) 25 MG tablet Take 1 tablet (25 mg total) by mouth daily. 90 tablet 3   No current facility-administered medications for this visit.    Allergies:   Patient has no known allergies.   Social History:  The  patient  reports that he has never smoked. He has never used smokeless tobacco. He reports that he does not drink alcohol or use drugs.   Family History:  The patient's family history includes High blood pressure in his mother; Kidney failure in his father.    ROS:  Please see the history of present illness.   Otherwise, review of systems is positive for none.   All other systems are reviewed and negative.    PHYSICAL EXAM: VS:  BP (!) 154/78   Pulse 64   Ht 5\' 11"  (1.803 m)   Wt (!) 319 lb (144.7 kg)    BMI 44.49 kg/m  , BMI Body mass index is 44.49 kg/m. GEN: Well nourished, well developed, in no acute distress  HEENT: normal  Neck: no JVD, carotid bruits, or masses Cardiac: RRR; no murmurs, rubs, or gallops,no edema  Respiratory:  clear to auscultation bilaterally, normal work of breathing GI: soft, nontender, nondistended, + BS MS: no deformity or atrophy  Skin: warm and dry Neuro:  Strength and sensation are intact Psych: euthymic mood, full affect  EKG:  EKG is ordered today. Personal review of the ekg ordered shows sinus rhythm, rate 64  Recent Labs: 03/21/2019: B Natriuretic Peptide 261.6 03/22/2019: TSH 1.220 03/24/2019: ALT 15 03/27/2019: Hemoglobin 14.8; Magnesium 2.0; Platelets 276 06/10/2019: BUN 15; Creatinine, Ser 1.01; Potassium 4.2; Sodium 139    Lipid Panel     Component Value Date/Time   CHOL 159 03/23/2019 1253   TRIG 105 03/23/2019 1253   HDL 32 (L) 03/23/2019 1253   CHOLHDL 5.0 03/23/2019 1253   VLDL 21 03/23/2019 1253   LDLCALC 106 (H) 03/23/2019 1253     Wt Readings from Last 3 Encounters:  08/20/19 (!) 319 lb (144.7 kg)  08/09/19 (!) 316 lb 2.2 oz (143.4 kg)  07/01/19 (!) 317 lb (143.8 kg)      Other studies Reviewed: Additional studies/ records that were reviewed today include: TTE 03/22/19  Review of the above records today demonstrates:  1. Left ventricular ejection fraction, by visual estimation, is <20%. The  left ventricle has severely decreased function. There is no left  ventricular hypertrophy.  2. Definity contrast agent was given IV to delineate the left ventricular  endocardial borders.  3. Moderately dilated left ventricular internal cavity size.  4. The left ventricle demonstrates global hypokinesis.  5. Left ventricular diastolic parameters are indeterminate.  6. LV thrombus excluded by echo contrast.  7. Global right ventricle has severely reduced systolic function.The  right ventricular size is not well visualized.  Right vetricular wall  thickness was not assessed.  8. Left atrial size was normal.  9. Right atrial size was normal.  10. Mild to moderate aortic valve annular calcification.  11. The mitral valve is normal in structure. Mild to moderate mitral valve  regurgitation.  12. The tricuspid valve is normal in structure. Tricuspid valve  regurgitation mild-moderate.  13. The aortic valve is tricuspid. Aortic valve regurgitation is not  visualized. No evidence of aortic valve sclerosis or stenosis.  14. The pulmonic valve was not well visualized. Pulmonic valve  regurgitation is not visualized.  15. Mildly elevated pulmonary artery systolic pressure.  16. The inferior vena cava is dilated in size with <50% respiratory  variability, suggesting right atrial pressure of 15 mmHg.  LHC 03/25/19 1. Normal coronary anatomy 2. Moderately elevated LV filling pressures. PCWP and LVEDP of 23 mm Hg 3. Mild pulmonary venous HTN 4. Preserved cardiac output. Index 3.22.  ASSESSMENT AND PLAN:  1.  Chronic systolic heart failure due to nonischemic cardiomyopathy: Diagnosed November 2020 in the setting of atrial fibrillation with rapid rates.  Currently on metoprolol, Entresto, Aldactone.  He Nitara Szczerba need a repeat echocardiogram to see if his ejection fraction has improved for evaluation of ICD therapy.  2.  Persistent atrial fibrillation: Currently on amiodarone, metoprolol, Eliquis.  CHA2DS2-VASc of at least 2.  Fortunately he remains in sinus rhythm since amiodarone dose adjustment.  He feels well without major complaint.  We did discuss the possibility of ablation for his atrial fibrillation.  This would be supported by the [facility] AF trial and allow him to get off of amiodarone.  He does have some hesitation, but I feel that he Ardie Dragoo agree to ablation in the future.  We Yasuo Phimmasone give him information today to discuss with his family.  3.  Essential hypertension: Currently elevated but has had a mixup with his  Entresto.  We Tammela Bales go back on his prior dose.  Case discussed with referring cardiology    Current medicines are reviewed at length with the patient today.   The patient does not have concerns regarding his medicines.  The following changes were made today:  none  Labs/ tests ordered today include:  Orders Placed This Encounter  Procedures  . EKG 12-Lead     Disposition:   FU with Alianah Lofton 3 months  Signed, Kristilyn Coltrane Meredith Leeds, MD  08/20/2019 9:32 AM     [facility] [address] [place] [address] [phone] (office) [phone] (fax)

## 2019-08-27 ENCOUNTER — Encounter (HOSPITAL_COMMUNITY): Payer: BC Managed Care – PPO

## 2019-09-05 NOTE — Progress Notes (Signed)
Cardiology Office Note:   Date:  09/06/2019  NAME:  Parker Calderon    MRN: [id number] DOB:  [birth year]   PCP:  Shirline Frees, MD  Cardiologist:  Evalina Field, MD   Referring MD: Shirline Frees, MD   Chief Complaint  Patient presents with  . Congestive Heart Failure   History of Present Illness:   Parker Calderon is a 59 y.o. male with a hx of morbid obesity, HFrEF, Afib persistent, HTN who presents for follow-up of systolic HF. Recent evaluation by EP who has recommended ablation. Remains on Amiodarone which is not ideal given young age. He reports he has been doing remarkably well.  His weight is down to 313.  He continues to lose weight.  He still needs to work on reducing the amount of regular sodas he drinks.  He also reports that sweets are an issue.  My goal for her over the next 3 months is to lose 10 to 15 pounds.  This could easily be done with cessation of soda as well as sweet items.  Blood pressure today well controlled.  He does present with a log which shows systolic blood pressure at home around 110/60.  Heart rates in the 70s.  His pulse is regular today and I suspect he is in normal rhythm.  He has remained on amiodarone as discussed above.  Electrophysiology has recommended a cardiac ablation.  I do agree with this.  We do need to repeat an ultrasound of his heart which we will order today to see if his function has returned to normal.  He denies any lower extremity edema.  He reports he cut his grass other day without any limitations.  He reports he works in the yard for 3 hours without any chest pain or shortness of breath.  No snoring to suggest sleep apnea.  Overall he appears to be doing quite well.  Problem List: 1. Non-ischemic CM (EF 25%, 03/2019) 2. Normal coronaries on [facility] 03/25/2019 3. Atrial fibrillation s/p DCCV 03/2019 -recurrence 06/25/2019 4. Morbid obesity 5. Hypertension   Past Medical History: Past Medical History:  Diagnosis Date  . Arthritis    Rhinitis  . Atrial fibrillation ([place])   . CHF (congestive heart failure) ([place])   . Hyperlipidemia   . Hypertension   . Irritable bowel syndrome   . Left thigh pain    Lateral, aching  . Obesity, unspecified   . Varicose veins    Past Surgical History: Past Surgical History:  Procedure Laterality Date  . CARDIAC CATHETERIZATION    . CARDIOVERSION N/A 03/26/2019   Procedure: CARDIOVERSION;  Surgeon: Josue Hector, MD;  Location: [facility] ENDOSCOPY;  Service: Cardiovascular;  Laterality: N/A;  . RIGHT/LEFT HEART CATH AND CORONARY ANGIOGRAPHY N/A 03/25/2019   Procedure: RIGHT/LEFT HEART CATH AND CORONARY ANGIOGRAPHY;  Surgeon: Martinique, Peter M, MD;  Location: [place] CV LAB;  Service: Cardiovascular;  Laterality: N/A;  . TEE WITHOUT CARDIOVERSION N/A 03/26/2019   Procedure: TRANSESOPHAGEAL ECHOCARDIOGRAM (TEE);  Surgeon: Josue Hector, MD;  Location: [facility] ENDOSCOPY;  Service: Cardiovascular;  Laterality: N/A;   Current Medications: Current Meds  Medication Sig  . amiodarone (PACERONE) 200 MG tablet Take 1 tablet (200 mg total) by mouth daily.  Marland Kitchen apixaban (ELIQUIS) 5 MG TABS tablet Take 1 tablet (5 mg total) by mouth 2 (two) times daily.  Marland Kitchen atorvastatin (LIPITOR) 40 MG tablet Take 1 tablet (40 mg total) by mouth daily at 6 PM.  . furosemide (LASIX) 80 MG  tablet Take 1 tablet (80 mg total) by mouth every other day.  . metoprolol succinate (TOPROL-XL) 100 MG 24 hr tablet Take 1 tablet (100 mg total) by mouth daily. Take with or immediately following a meal.  . Multiple Vitamin (MULTIVITAMIN) tablet Take 1 tablet by mouth daily.  . sacubitril-valsartan (ENTRESTO) 97-103 MG Take 1 tablet by mouth 2 (two) times daily.  Marland Kitchen spironolactone (ALDACTONE) 25 MG tablet Take 1 tablet (25 mg total) by mouth daily.    Allergies:    Patient has no known allergies.   Social History: Social History   Socioeconomic History  . Marital status: Single    Spouse name: Not on file  . Number of children: Not  on file  . Years of education: Not on file  . Highest education level: High school graduate  Occupational History  . Not on file  Tobacco Use  . Smoking status: Never Smoker  . Smokeless tobacco: Never Used  Substance and Sexual Activity  . Alcohol use: No  . Drug use: No  . Sexual activity: Not on file  Other Topics Concern  . Not on file  Social History Narrative  . Not on file   Social Determinants of Health   Financial Resource Strain: Low Risk   . Difficulty of Paying Living Expenses: Not hard at all  Food Insecurity: No Food Insecurity  . Worried About Charity fundraiser in the Last Year: Never true  . Ran Out of Food in the Last Year: Never true  Transportation Needs: No Transportation Needs  . Lack of Transportation (Medical): No  . Lack of Transportation (Non-Medical): No  Physical Activity: Unknown  . Days of Exercise per Week: 0 days  . Minutes of Exercise per Session: Not on file  Stress: No Stress Concern Present  . Feeling of Stress : Not at all  Social Connections:   . Frequency of Communication with Friends and Family:   . Frequency of Social Gatherings with Friends and Family:   . Attends Religious Services:   . Active Member of Clubs or Organizations:   . Attends Archivist Meetings:   Marland Kitchen Marital Status:     Family History: The patient's family history includes High blood pressure in his mother; Kidney failure in his father.  ROS:   All other ROS reviewed and negative. Pertinent positives noted in the HPI.     EKGs/Labs/Other Studies Reviewed:   The following studies were personally reviewed by me today:  [place] 03/25/2019  1. Normal coronary anatomy 2. Moderately elevated LV filling pressures. PCWP and LVEDP of 23 mm Hg 3. Mild pulmonary venous HTN 4. Preserved cardiac output. Index 3.22.  TTE 03/22/2019 1. Left ventricular ejection fraction, by visual estimation, is <20%. The  left ventricle has severely decreased function. There  is no left  ventricular hypertrophy.  2. Definity contrast agent was given IV to delineate the left ventricular  endocardial borders.  3. Moderately dilated left ventricular internal cavity size.  4. The left ventricle demonstrates global hypokinesis.  5. Left ventricular diastolic parameters are indeterminate.  6. LV thrombus excluded by echo contrast.  7. Global right ventricle has severely reduced systolic function.The  right ventricular size is not well visualized. Right vetricular wall  thickness was not assessed.  8. Left atrial size was normal.  9. Right atrial size was normal.  10. Mild to moderate aortic valve annular calcification.  11. The mitral valve is normal in structure. Mild to moderate mitral valve  regurgitation.  12. The tricuspid valve is normal in structure. Tricuspid valve  regurgitation mild-moderate.  13. The aortic valve is tricuspid. Aortic valve regurgitation is not  visualized. No evidence of aortic valve sclerosis or stenosis.  14. The pulmonic valve was not well visualized. Pulmonic valve  regurgitation is not visualized.  15. Mildly elevated pulmonary artery systolic pressure.  16. The inferior vena cava is dilated in size with <50% respiratory  variability, suggesting right atrial pressure of 15 mmHg.   Recent Labs: 03/21/2019: B Natriuretic Peptide 261.6 03/22/2019: TSH 1.220 03/24/2019: ALT 15 03/27/2019: Hemoglobin 14.8; Magnesium 2.0; Platelets 276 06/10/2019: BUN 15; Creatinine, Ser 1.01; Potassium 4.2; Sodium 139   Recent Lipid Panel    Component Value Date/Time   CHOL 159 03/23/2019 1253   TRIG 105 03/23/2019 1253   HDL 32 (L) 03/23/2019 1253   CHOLHDL 5.0 03/23/2019 1253   VLDL 21 03/23/2019 1253   LDLCALC 106 (H) 03/23/2019 1253    Physical Exam:   VS:  BP 132/74   Pulse 78   Ht 5\' 11"  (1.803 m)   Wt (!) 313 lb (142 kg)   SpO2 98%   BMI 43.65 kg/m    Wt Readings from Last 3 Encounters:  09/06/19 (!) 313 lb (142 kg)    08/20/19 (!) 319 lb (144.7 kg)  08/09/19 (!) 316 lb 2.2 oz (143.4 kg)    General: Well nourished, well developed, in no acute distress Heart: Atraumatic, normal size  Eyes: PEERLA, EOMI  Neck: Supple, no JVD Endocrine: No thryomegaly Cardiac: Normal S1, S2; RRR; no murmurs, rubs, or gallops Lungs: Clear to auscultation bilaterally, no wheezing, rhonchi or rales  Abd: Soft, nontender, no hepatomegaly  Ext: No edema, pulses 2+ Musculoskeletal: No deformities, BUE and BLE strength normal and equal Skin: Warm and dry, no rashes   Neuro: Alert and oriented to person, place, time, and situation, CNII-XII grossly intact, no focal deficits  Psych: Normal mood and affect   ASSESSMENT:   Parker Calderon is a 59 y.o. male who presents for the following: 1. Chronic systolic heart failure (HCC)   2. Persistent atrial fibrillation ([place])   3. Adequate anticoagulation on anticoagulant therapy   4. Essential hypertension   5. Obesity, morbid, BMI 40.0-49.9 ([place])     PLAN:    1. Chronic systolic heart failure ([place]), NYHA class II/III, ACC AHA stage C, nonischemic cardiopathy -Diagnosed November 2020. EF 10-15%,  In the setting of atrial fibrillation with RVR.  Normal coronaries.  Ejection fraction 10 to 15%. -He did undergo TEE cardioversion with short course of amiodarone therapy.  His atrial fibrillation recurred and he was placed back on amiodarone. -For now we will continue amiodarone 200 mg daily.  He is in normal sinus rhythm.  He needs a repeat echocardiogram.  I do agree with a cardiac ablation.  He will continue to think about this.  -We will see where his heart function is.  And if he will need to be considered for ICD.  He reports he is not really interested in ICD but will think about it. -continue metoprolol succinate 100 mg daily, Entresto 97-103 twice daily, Aldactone 25 mg daily -He will take Lasix 80 mg every other day -We will see him back in 3 months after his echocardiogram as  well as repeat discussion with EP for ablation  2. Persistent atrial fibrillation (HCC) -In normal sinus rhythm.  Continue amiodarone 200 mg daily.   -Continue Eliquis 5 mg twice daily  3. Essential hypertension -Well-controlled on current medications  4. Obesity, BMI 44 -We have set a goal for 10 to 15 pound weight loss in the next 3 months.  He will get there by reducing soda as well as sweet snacks.  Disposition: Return in about 3 months (around 12/06/2019).  Medication Adjustments/Labs and Tests Ordered: Current medicines are reviewed at length with the patient today.  Concerns regarding medicines are outlined above.  Orders Placed This Encounter  Procedures  . ECHOCARDIOGRAM COMPLETE   No orders of the defined types were placed in this encounter.   Patient Instructions  Medication Instructions:  The current medical regimen is effective;  continue present plan and medications.  *If you need a refill on your cardiac medications before your next appointment, please call your pharmacy*   Testing/Procedures: Echocardiogram - Your physician has requested that you have an echocardiogram. Echocardiography is a painless test that uses sound waves to create images of your heart. It provides your doctor with information about the size and shape of your heart and how well your heart's chambers and valves are working. This procedure takes approximately one hour. There are no restrictions for this procedure. This will be performed at our [facility] location - [address], [address].    Follow-Up: At [facility], you and your health needs are our priority.  As part of our continuing mission to provide you with exceptional heart care, we have created designated Provider Care Teams.  These Care Teams include your primary Cardiologist (physician) and Advanced Practice Providers (APPs -  Physician Assistants and Nurse Practitioners) who all work together to provide you with the care you  need, when you need it.  We recommend signing up for the patient portal called "MyChart".  Sign up information is provided on this After Visit Summary.  MyChart is used to connect with patients for Virtual Visits (Telemedicine).  Patients are able to view lab/test results, encounter notes, upcoming appointments, etc.  Non-urgent messages can be sent to your provider as well.   To learn more about what you can do with MyChart, go to [email or web address].    Your next appointment:   3 month(s)  The format for your next appointment:   In Person  Provider:   Eleonore Chiquito, MD        Time Spent with Patient: I have spent a total of 25 minutes with patient reviewing hospital notes, telemetry, EKGs, labs and examining the patient as well as establishing an assessment and plan that was discussed with the patient.  > 50% of time was spent in direct patient care.  Signed, Addison Naegeli. Audie Box, [place]  [address] [place], [place] [address] [phone]  09/06/2019 10:05 AM

## 2019-09-06 ENCOUNTER — Encounter: Payer: Self-pay | Admitting: Cardiovascular Disease

## 2019-09-06 ENCOUNTER — Ambulatory Visit (INDEPENDENT_AMBULATORY_CARE_PROVIDER_SITE_OTHER): Payer: BC Managed Care – PPO | Admitting: Cardiovascular Disease

## 2019-09-06 ENCOUNTER — Other Ambulatory Visit: Payer: Self-pay

## 2019-09-06 VITALS — BP 132/74 | HR 78 | Ht 71.0 in | Wt 313.0 lb

## 2019-09-06 DIAGNOSIS — I10 Essential (primary) hypertension: Secondary | ICD-10-CM | POA: Diagnosis not present

## 2019-09-06 DIAGNOSIS — I4819 Other persistent atrial fibrillation: Secondary | ICD-10-CM

## 2019-09-06 DIAGNOSIS — I5022 Chronic systolic (congestive) heart failure: Secondary | ICD-10-CM

## 2019-09-06 DIAGNOSIS — Z7901 Long term (current) use of anticoagulants: Secondary | ICD-10-CM | POA: Diagnosis not present

## 2019-09-06 NOTE — Patient Instructions (Signed)
Medication Instructions:  The current medical regimen is effective;  continue present plan and medications.  *If you need a refill on your cardiac medications before your next appointment, please call your pharmacy*   Testing/Procedures: Echocardiogram - Your physician has requested that you have an echocardiogram. Echocardiography is a painless test that uses sound waves to create images of your heart. It provides your doctor with information about the size and shape of your heart and how well your heart's chambers and valves are working. This procedure takes approximately one hour. There are no restrictions for this procedure. This will be performed at our [address] location - [address], [address].    Follow-Up: At [facility], you and your health needs are our priority.  As part of our continuing mission to provide you with exceptional heart care, we have created designated Provider Care Teams.  These Care Teams include your primary Cardiologist (physician) and Advanced Practice Providers (APPs -  Physician Assistants and Nurse Practitioners) who all work together to provide you with the care you need, when you need it.  We recommend signing up for the patient portal called "MyChart".  Sign up information is provided on this After Visit Summary.  MyChart is used to connect with patients for Virtual Visits (Telemedicine).  Patients are able to view lab/test results, encounter notes, upcoming appointments, etc.  Non-urgent messages can be sent to your provider as well.   To learn more about what you can do with MyChart, go to [email or web address].    Your next appointment:   3 month(s)  The format for your next appointment:   In Person  Provider:   [place] O'Neal, MD      

## 2019-09-21 ENCOUNTER — Telehealth: Payer: Self-pay | Admitting: Cardiovascular Disease

## 2019-09-21 NOTE — Telephone Encounter (Signed)
New Message:   Need to verify Patient's medicine list. Her fax number is [phone]

## 2019-09-21 NOTE — Telephone Encounter (Signed)
Spoke with Youth worker at [employer]. Reviewed current medications with Christa. Last office note and current med list faxed to [phone].

## 2019-11-12 ENCOUNTER — Telehealth: Payer: Self-pay | Admitting: Cardiovascular Disease

## 2019-11-12 NOTE — Telephone Encounter (Signed)
I attempted to contact patient 11/12/19 to schedule follow up visit from patients recall list. The patient didn't answer, left message for patient to return call.

## 2019-11-20 ENCOUNTER — Other Ambulatory Visit: Payer: Self-pay | Admitting: Cardiovascular Disease

## 2019-11-29 ENCOUNTER — Ambulatory Visit (HOSPITAL_COMMUNITY): Payer: BC Managed Care – PPO | Attending: Cardiovascular Disease

## 2019-11-29 ENCOUNTER — Other Ambulatory Visit: Payer: Self-pay

## 2019-11-29 DIAGNOSIS — I5022 Chronic systolic (congestive) heart failure: Secondary | ICD-10-CM | POA: Insufficient documentation

## 2019-12-05 NOTE — Progress Notes (Signed)
Cardiology Office Note:   Date:  12/06/2019  NAME:  Parker Calderon    MRN: [id number] DOB:  [birth year]   PCP:  Shirline Frees, MD  Cardiologist:  Evalina Field, MD   Referring MD: Shirline Frees, MD   Chief Complaint  Patient presents with   Congestive Heart Failure   History of Present Illness:   Parker Calderon is a 59 y.o. male with a hx of systolic HF, persistent Afib, obesity, hypertension who presents for follow-up. Diagnosed with CHF in 03/2019. Normal coronaries. In setting of Afib with RVR. Failed DCCV. Back on amiodarone and discussing ablation with EP. EF has normalized. He reports he is doing well.  He is not lost a lot of weight but his weight is staying stable.  He denies any chest pain or shortness of breath today.  He reports he is not exercising due to the hot environment right now.  It is quite hot in the summer [place].  He does have a treadmill and exercise bike but this apparently is outside.  He is going to try to do this when it is cooler in the evenings or in the early morning.  He reports that he was evaluated by electrophysiology for cardiac ablation.  He is still undecided on this.  He does request to try to come off amiodarone to see if this will help.  He reports of his A. Fib does come back he would pursue a cardiac ablation.  Everything seems to be stable and he is doing well today.  Problem List: 1. 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 Atrial fibrillation s/p DCCV 03/2019 -recurrence 06/25/2019 4. Morbid obesity(BMI 44) 5. Hypertension  Past Medical History: Past Medical History:  Diagnosis Date   Arthritis    Rhinitis   Atrial fibrillation (HCC)    CHF (congestive heart failure) (HCC)    Hyperlipidemia    Hypertension    Irritable bowel syndrome    Left thigh pain    Lateral, aching   Obesity, unspecified    Varicose veins     Past Surgical History: Past Surgical  History:  Procedure Laterality Date   CARDIAC CATHETERIZATION     CARDIOVERSION N/A 03/26/2019   Procedure: CARDIOVERSION;  Surgeon: Josue Hector, MD;  Location: [facility];  Service: Cardiovascular;  Laterality: N/A;   RIGHT/LEFT HEART CATH AND CORONARY ANGIOGRAPHY N/A 03/25/2019   Procedure: RIGHT/LEFT HEART CATH AND CORONARY ANGIOGRAPHY;  Surgeon: Martinique, Peter M, MD;  Location: [facility];  Service: Cardiovascular;  Laterality: N/A;   TEE WITHOUT CARDIOVERSION N/A 03/26/2019   Procedure: TRANSESOPHAGEAL ECHOCARDIOGRAM (TEE);  Surgeon: Josue Hector, MD;  Location: [facility];  Service: Cardiovascular;  Laterality: N/A;    Current Medications: Current Meds  Medication Sig   apixaban (ELIQUIS) 5 MG TABS tablet Take 1 tablet (5 mg total) by mouth 2 (two) times daily.   atorvastatin (LIPITOR) 40 MG tablet Take 1 tablet (40 mg total) by mouth daily at 6 PM.   furosemide (LASIX) 80 MG tablet Take 1 tablet (80 mg total) by mouth every other day.   metoprolol succinate (TOPROL-XL) 100 MG 24 hr tablet Take 1 tablet (100 mg total) by mouth daily. Take with or immediately following a meal.   Multiple Vitamin (MULTIVITAMIN) tablet Take 1 tablet by mouth daily.   sacubitril-valsartan (ENTRESTO) 97-103 MG Take 1 tablet by mouth 2 (two) times daily.   spironolactone (ALDACTONE) 25 MG tablet  Take 1 tablet (25 mg total) by mouth daily.   [DISCONTINUED] amiodarone (PACERONE) 200 MG tablet TAKE 1 TABLET(200 MG) BY MOUTH DAILY   [DISCONTINUED] apixaban (ELIQUIS) 5 MG TABS tablet Take 1 tablet (5 mg total) by mouth 2 (two) times daily.   [DISCONTINUED] atorvastatin (LIPITOR) 40 MG tablet Take 1 tablet (40 mg total) by mouth daily at 6 PM.   [DISCONTINUED] furosemide (LASIX) 80 MG tablet Take 1 tablet (80 mg total) by mouth every other day.   [DISCONTINUED] metoprolol succinate (TOPROL-XL) 100 MG 24 hr tablet Take 1 tablet (100 mg total) by mouth daily. Take with or immediately  following a meal.   [DISCONTINUED] sacubitril-valsartan (ENTRESTO) 97-103 MG Take 1 tablet by mouth 2 (two) times daily.   [DISCONTINUED] spironolactone (ALDACTONE) 25 MG tablet Take 1 tablet (25 mg total) by mouth daily.     Allergies:    Patient has no known allergies.   Social History: Social History   Socioeconomic History   Marital status: Single    Spouse name: Not on file   Number of children: Not on file   Years of education: Not on file   Highest education level: High school graduate  Occupational History   Not on file  Tobacco Use   Smoking status: Never Smoker   Smokeless tobacco: Never Used  Vaping Use   Vaping Use: Never assessed  Substance and Sexual Activity   Alcohol use: No   Drug use: No   Sexual activity: Not on file  Other Topics Concern   Not on file  Social History Narrative   Not on file   Social Determinants of Health   Financial Resource Strain: Low Risk    Difficulty of Paying Living Expenses: Not hard at all  Food Insecurity: No Food Insecurity   Worried About Charity fundraiser in the Last Year: Never true   [place] in the Last Year: Never true  Transportation Needs: No Transportation Needs   Lack of Transportation (Medical): No   Lack of Transportation (Non-Medical): No  Physical Activity: Unknown   Days of Exercise per Week: 0 days   Minutes of Exercise per Session: Not on file  Stress: No Stress Concern Present   Feeling of Stress : Not at all  Social Connections:    Frequency of Communication with Friends and Family:    Frequency of Social Gatherings with Friends and Family:    Attends Religious Services:    Active Member of Clubs or Organizations:    Attends Archivist Meetings:    Marital Status:      Family History: The patient's family history includes High blood pressure in his mother; Kidney failure in his father.  ROS:   All other ROS reviewed and negative. Pertinent  positives noted in the HPI.     EKGs/Labs/Other Studies Reviewed:   The following studies were personally reviewed by me today:  TTE 11/29/2019 1. Since the last study on 03/22/2019 LVEF has improved from 20% to  55-60%.  2. Left ventricular ejection fraction, by estimation, is 55 to 60%. The  left ventricle has normal function. The left ventricle has no regional  wall motion abnormalities. There is moderate concentric left ventricular  hypertrophy. Left ventricular  diastolic parameters are consistent with Grade II diastolic dysfunction  (pseudonormalization).  3. Right ventricular systolic function is normal. The right ventricular  size is normal. There is normal pulmonary artery systolic pressure.  4. Left atrial size was mildly  dilated.  5. The mitral valve is normal in structure. Mild mitral valve  regurgitation. No evidence of mitral stenosis.  6. The aortic valve is normal in structure. Aortic valve regurgitation is  not visualized. No aortic stenosis is present.  7. The inferior vena cava is normal in size with greater than 50%  respiratory variability, suggesting right atrial pressure of 3 mmHg.   Recent Labs: 03/21/2019: B Natriuretic Peptide 261.6 03/22/2019: TSH 1.220 03/24/2019: ALT 15 03/27/2019: Hemoglobin 14.8; Magnesium 2.0; Platelets 276 06/10/2019: BUN 15; Creatinine, Ser 1.01; Potassium 4.2; Sodium 139   Recent Lipid Panel    Component Value Date/Time   CHOL 159 03/23/2019 1253   TRIG 105 03/23/2019 1253   HDL 32 (L) 03/23/2019 1253   CHOLHDL 5.0 03/23/2019 1253   VLDL 21 03/23/2019 1253   LDLCALC 106 (H) 03/23/2019 1253    Physical Exam:   VS:  BP 138/74    Pulse 77    Ht 5\' 11"  (1.803 m)    Wt (!) 312 lb 12.8 oz (141.9 kg)    SpO2 100%    BMI 43.63 kg/m    Wt Readings from Last 3 Encounters:  12/06/19 (!) 312 lb 12.8 oz (141.9 kg)  09/06/19 (!) 313 lb (142 kg)  08/20/19 (!) 319 lb (144.7 kg)    General: Well nourished, well developed, in no  acute distress Heart: Atraumatic, normal size  Eyes: PEERLA, EOMI  Neck: Supple, no JVD Endocrine: No thryomegaly Cardiac: Normal S1, S2; RRR; no murmurs, rubs, or gallops Lungs: Clear to auscultation bilaterally, no wheezing, rhonchi or rales  Abd: Soft, nontender, no hepatomegaly  Ext: No edema, pulses 2+ Musculoskeletal: No deformities, BUE and BLE strength normal and equal Skin: Warm and dry, no rashes   Neuro: Alert and oriented to person, place, time, and situation, CNII-XII grossly intact, no focal deficits  Psych: Normal mood and affect   ASSESSMENT:   ILIR MAHRT is a 59 y.o. male who presents for the following: 1. Chronic systolic heart failure (HCC)   2. Persistent atrial fibrillation (HCC)   3. Obesity, morbid, BMI 40.0-49.9 ([place])   4. Essential hypertension   5. Varicose veins of left lower extremity, unspecified whether complicated     PLAN:   1. Chronic systolic heart failure ([place]), ejection fraction 10-15% with recovery of EF up to 55-60%, New York Heart Association class I, ACC AHA stage C -Systolic heart failure diagnosed November 2020.  Ejection fraction was 10 to 15%.  Normal coronaries.  In the setting of atrial fibrillation.  I suspect his cardiomyopathy was arrhythmia related. -He is back in normal rhythm.  He did have recurrence of A. fib which we started him on amiodarone.  Remains on Eliquis.  He is not interested in cardiac ablation at this time.  He does wish to pursue a trial off amiodarone to see if his rhythm disturbance comes back.  We will do this today. -He will continue his metoprolol succinate 100 mg daily, Entresto 97-103 mg twice daily, Aldactone 25 mg daily.  He takes Lasix 80 mg every other day.  Euvolemic on exam today.  Doing well.  Ejection fraction has recovered.  No need for ICD.  2. Persistent atrial fibrillation ([place]) -Initially diagnosed in the setting of new onset cardiomyopathy.  Status post TEE/cardioversion.  Recurrence of atrial  fibrillation.  On amiodarone.  Has declined cardiac ablation.  We will continue with Eliquis 5 mg twice daily.  We will give him a trial of  amiodarone to see if he has recurrence of his arrhythmia.  I will see him back in 3 months for an EKG.  He will let us know if he has any symptoms. -He was counseled on the importance of weight loss.  This could be an effective method for him to maintain normal sinus rhythm.  I would like for him to try to lose least 30 pounds by the time he sees him back in 3 months.  3. Obesity, morbid, BMI 40.0-49.9 ([place]) -Weight loss counseling provided today.  4. Essential hypertension -BP acceptable today.  130/74.  Continue above heart failure medications.  5. Varicose veins of left lower extremity, unspecified whether complicated -Referral to dermatology.  Disposition: Return in about 3 months (around 03/07/2020).  Medication Adjustments/Labs and Tests Ordered: Current medicines are reviewed at length with the patient today.  Concerns regarding medicines are outlined above.  Orders Placed This Encounter  Procedures   Ambulatory referral to Dermatology   Meds ordered this encounter  Medications   spironolactone (ALDACTONE) 25 MG tablet    Sig: Take 1 tablet (25 mg total) by mouth daily.    Dispense:  90 tablet    Refill:  3   sacubitril-valsartan (ENTRESTO) 97-103 MG    Sig: Take 1 tablet by mouth 2 (two) times daily.    Dispense:  60 tablet    Refill:  11   metoprolol succinate (TOPROL-XL) 100 MG 24 hr tablet    Sig: Take 1 tablet (100 mg total) by mouth daily. Take with or immediately following a meal.    Dispense:  90 tablet    Refill:  3   furosemide (LASIX) 80 MG tablet    Sig: Take 1 tablet (80 mg total) by mouth every other day.    Dispense:  90 tablet    Refill:  0   atorvastatin (LIPITOR) 40 MG tablet    Sig: Take 1 tablet (40 mg total) by mouth daily at 6 PM.    Dispense:  90 tablet    Refill:  3   apixaban (ELIQUIS) 5 MG TABS  tablet    Sig: Take 1 tablet (5 mg total) by mouth 2 (two) times daily.    Dispense:  180 tablet    Refill:  3    Patient Instructions  Medication Instructions:  Stop Amiodarone  All refills sent to pharmacy.   *If you need a refill on your cardiac medications before your next appointment, please call your pharmacy*   Follow-Up: At [facility], you and your health needs are our priority.  As part of our continuing mission to provide you with exceptional heart care, we have created designated Provider Care Teams.  These Care Teams include your primary Cardiologist (physician) and Advanced Practice Providers (APPs -  Physician Assistants and Nurse Practitioners) who all work together to provide you with the care you need, when you need it.  We recommend signing up for the patient portal called "MyChart".  Sign up information is provided on this After Visit Summary.  MyChart is used to connect with patients for Virtual Visits (Telemedicine).  Patients are able to view lab/test results, encounter notes, upcoming appointments, etc.  Non-urgent messages can be sent to your provider as well.   To learn more about what you can do with MyChart, go to [email or web address].    Your next appointment:   3 month(s)  The format for your next appointment:   In Person  Provider:   Eleonore Chiquito, MD  Other: We have sent referral to Dermatology- they will contact you for an appointment.    Time Spent with Patient: I have spent a total of 35 minutes with patient reviewing hospital notes, telemetry, EKGs, labs and examining the patient as well as establishing an assessment and plan that was discussed with the patient.  > 50% of time was spent in direct patient care.  Signed, Addison Naegeli. Audie Box, [place]  [address] [place], [place] [address] [phone]  12/06/2019 8:15 AM

## 2019-12-06 ENCOUNTER — Other Ambulatory Visit: Payer: Self-pay

## 2019-12-06 ENCOUNTER — Encounter: Payer: Self-pay | Admitting: Cardiovascular Disease

## 2019-12-06 ENCOUNTER — Ambulatory Visit (INDEPENDENT_AMBULATORY_CARE_PROVIDER_SITE_OTHER): Payer: BC Managed Care – PPO | Admitting: Cardiovascular Disease

## 2019-12-06 VITALS — BP 138/74 | HR 77 | Ht 71.0 in | Wt 312.8 lb

## 2019-12-06 DIAGNOSIS — I5022 Chronic systolic (congestive) heart failure: Secondary | ICD-10-CM | POA: Diagnosis not present

## 2019-12-06 DIAGNOSIS — I10 Essential (primary) hypertension: Secondary | ICD-10-CM | POA: Diagnosis not present

## 2019-12-06 DIAGNOSIS — I4819 Other persistent atrial fibrillation: Secondary | ICD-10-CM | POA: Diagnosis not present

## 2019-12-06 DIAGNOSIS — I8392 Asymptomatic varicose veins of left lower extremity: Secondary | ICD-10-CM

## 2019-12-06 DIAGNOSIS — D229 Melanocytic nevi, unspecified: Secondary | ICD-10-CM

## 2019-12-06 MED ORDER — FUROSEMIDE 80 MG PO TABS: 80.0000 mg | ORAL_TABLET | ORAL | 0 refills | Status: DC

## 2019-12-06 MED ORDER — SPIRONOLACTONE 25 MG PO TABS: 25.0000 mg | ORAL_TABLET | Freq: Every day | ORAL | 3 refills | Status: DC

## 2019-12-06 MED ORDER — SACUBITRIL-VALSARTAN 97-103 MG PO TABS: 1.0000 | ORAL_TABLET | Freq: Two times a day (BID) | ORAL | 11 refills | Status: DC

## 2019-12-06 MED ORDER — METOPROLOL SUCCINATE ER 100 MG PO TB24: 100.0000 mg | ORAL_TABLET | Freq: Every day | ORAL | 3 refills | Status: DC

## 2019-12-06 MED ORDER — APIXABAN 5 MG PO TABS: 5.0000 mg | ORAL_TABLET | Freq: Two times a day (BID) | ORAL | 3 refills | Status: DC

## 2019-12-06 MED ORDER — ATORVASTATIN CALCIUM 40 MG PO TABS: 40.0000 mg | ORAL_TABLET | Freq: Every day | ORAL | 3 refills | Status: DC

## 2019-12-06 NOTE — Patient Instructions (Addendum)
Medication Instructions:  Stop Amiodarone  All refills sent to pharmacy.   *If you need a refill on your cardiac medications before your next appointment, please call your pharmacy*   Follow-Up: At [facility], you and your health needs are our priority.  As part of our continuing mission to provide you with exceptional heart care, we have created designated Provider Care Teams.  These Care Teams include your primary Cardiologist (physician) and Advanced Practice Providers (APPs -  Physician Assistants and Nurse Practitioners) who all work together to provide you with the care you need, when you need it.  We recommend signing up for the patient portal called "MyChart".  Sign up information is provided on this After Visit Summary.  MyChart is used to connect with patients for Virtual Visits (Telemedicine).  Patients are able to view lab/test results, encounter notes, upcoming appointments, etc.  Non-urgent messages can be sent to your provider as well.   To learn more about what you can do with MyChart, go to [email or web address].    Your next appointment:   3 month(s)  The format for your next appointment:   In Person  Provider:   Eleonore Chiquito, MD   Other: We have sent referral to Dermatology- they will contact you for an appointment.

## 2020-03-05 NOTE — Progress Notes (Signed)
Cardiology Office Note:   Date:  03/06/2020  NAME:  Parker Calderon    MRN: [id number] DOB:  1961/05/01   PCP:  Shirline Frees, MD  Cardiologist:  Evalina Field, MD   Referring MD: Shirline Frees, MD   Chief Complaint  Patient presents with  . Congestive Heart Failure   History of Present Illness:   Parker Calderon is a 59 y.o. male with a hx of CHF, Afib, obesity, HTN who presents for follow-up. 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 Declined Afib ablation.  He reports he is doing well.  Weights remain stable around 312 pounds.  No structured exercise.  Still working as a Psychologist, educational.  No limitations.  He denies any chest pain or shortness of breath.  He describes no worsening lower extremity edema.  He is on Lasix every other day.  Seems to be doing well on this regimen.  No bleeding issues on Eliquis.  We did stop his amiodarone at her last visit.  EKG today shows normal sinus rhythm.  We did discuss ambulatory ECG monitoring such as a cardia mobile device to monitor his rhythm.  He will look into this.  He is still not wanting to do an A. fib ablation at this time.  EF has recovered.  Overall doing well.  Denies chest pain, shortness of breath or palpitations in office today.  Problem List: 1. 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 Atrial fibrillation s/p DCCV 03/2019 -recurrence 06/25/2019 4. Morbid obesity(BMI 44) 5. Hypertension   Past Medical History: Past Medical History:  Diagnosis Date  . Arthritis    Rhinitis  . Atrial fibrillation ([place])   . CHF (congestive heart failure) ([place])   . Hyperlipidemia   . Hypertension   . Irritable bowel syndrome   . Left thigh pain    Lateral, aching  . Obesity, unspecified   . Varicose veins     Past Surgical History: Past Surgical History:  Procedure Laterality Date  . CARDIAC CATHETERIZATION    . CARDIOVERSION N/A 03/26/2019   Procedure: CARDIOVERSION;  Surgeon:  Josue Hector, MD;  Location: [facility] ENDOSCOPY;  Service: Cardiovascular;  Laterality: N/A;  . RIGHT/LEFT HEART CATH AND CORONARY ANGIOGRAPHY N/A 03/25/2019   Procedure: RIGHT/LEFT HEART CATH AND CORONARY ANGIOGRAPHY;  Surgeon: Martinique, Peter M, MD;  Location: [facility] CV LAB;  Service: Cardiovascular;  Laterality: N/A;  . TEE WITHOUT CARDIOVERSION N/A 03/26/2019   Procedure: TRANSESOPHAGEAL ECHOCARDIOGRAM (TEE);  Surgeon: Josue Hector, MD;  Location: [facility] ENDOSCOPY;  Service: Cardiovascular;  Laterality: N/A;    Current Medications: Current Meds  Medication Sig  . apixaban (ELIQUIS) 5 MG TABS tablet Take 1 tablet (5 mg total) by mouth 2 (two) times daily.  Marland Kitchen atorvastatin (LIPITOR) 40 MG tablet Take 1 tablet (40 mg total) by mouth daily at 6 PM.  . furosemide (LASIX) 80 MG tablet Take 1 tablet (80 mg total) by mouth every other day.  . metoprolol succinate (TOPROL-XL) 100 MG 24 hr tablet Take 1 tablet (100 mg total) by mouth daily. Take with or immediately following a meal.  . Multiple Vitamin (MULTIVITAMIN) tablet Take 1 tablet by mouth daily.  . sacubitril-valsartan (ENTRESTO) 97-103 MG Take 1 tablet by mouth 2 (two) times daily.  Marland Kitchen spironolactone (ALDACTONE) 25 MG tablet Take 1 tablet (25 mg total) by mouth daily.  . [DISCONTINUED] apixaban (ELIQUIS) 5 MG TABS tablet Take 1 tablet (5 mg total) by mouth 2 (two)  times daily.  . [DISCONTINUED] atorvastatin (LIPITOR) 40 MG tablet Take 1 tablet (40 mg total) by mouth daily at 6 PM.  . [DISCONTINUED] furosemide (LASIX) 80 MG tablet Take 1 tablet (80 mg total) by mouth every other day.  . [DISCONTINUED] metoprolol succinate (TOPROL-XL) 100 MG 24 hr tablet Take 1 tablet (100 mg total) by mouth daily. Take with or immediately following a meal.  . [DISCONTINUED] sacubitril-valsartan (ENTRESTO) 97-103 MG Take 1 tablet by mouth 2 (two) times daily.  . [DISCONTINUED] spironolactone (ALDACTONE) 25 MG tablet Take 1 tablet (25 mg total) by mouth daily.      Allergies:    Patient has no known allergies.   Social History: Social History   Socioeconomic History  . Marital status: Single    Spouse name: Not on file  . Number of children: Not on file  . Years of education: Not on file  . Highest education level: High school graduate  Occupational History  . Not on file  Tobacco Use  . Smoking status: Never Smoker  . Smokeless tobacco: Never Used  Vaping Use  . Vaping Use: Never assessed  Substance and Sexual Activity  . Alcohol use: No  . Drug use: No  . Sexual activity: Not on file  Other Topics Concern  . Not on file  Social History Narrative  . Not on file   Social Determinants of Health   Financial Resource Strain: Low Risk   . Difficulty of Paying Living Expenses: Not hard at all  Food Insecurity: No Food Insecurity  . Worried About Charity fundraiser in the Last Year: Never true  . Ran Out of Food in the Last Year: Never true  Transportation Needs: No Transportation Needs  . Lack of Transportation (Medical): No  . Lack of Transportation (Non-Medical): No  Physical Activity: Unknown  . Days of Exercise per Week: 0 days  . Minutes of Exercise per Session: Not on file  Stress: No Stress Concern Present  . Feeling of Stress : Not at all  Social Connections:   . Frequency of Communication with Friends and Family: Not on file  . Frequency of Social Gatherings with Friends and Family: Not on file  . Attends Religious Services: Not on file  . Active Member of Clubs or Organizations: Not on file  . Attends Archivist Meetings: Not on file  . Marital Status: Not on file     Family History: The patient's family history includes High blood pressure in his mother; Kidney failure in his father.  ROS:   All other ROS reviewed and negative. Pertinent positives noted in the HPI.     EKGs/Labs/Other Studies Reviewed:   The following studies were personally reviewed by me today:  EKG:  EKG is ordered today.  The  ekg ordered today demonstrates normal sinus rhythm, heart rate 70, no acute ischemic changes, no evidence of prior infarction, and was personally reviewed by me.   TTE 11/29/2019 1. Since the last study on 03/22/2019 LVEF has improved from 20% to  55-60%.  2. Left ventricular ejection fraction, by estimation, is 55 to 60%. The  left ventricle has normal function. The left ventricle has no regional  wall motion abnormalities. There is moderate concentric left ventricular  hypertrophy. Left ventricular  diastolic parameters are consistent with Grade II diastolic dysfunction  (pseudonormalization).  3. Right ventricular systolic function is normal. The right ventricular  size is normal. There is normal pulmonary artery systolic pressure.  4. Left  atrial size was mildly dilated.  5. The mitral valve is normal in structure. Mild mitral valve  regurgitation. No evidence of mitral stenosis.  6. The aortic valve is normal in structure. Aortic valve regurgitation is  not visualized. No aortic stenosis is present.  7. The inferior vena cava is normal in size with greater than 50%  respiratory variability, suggesting right atrial pressure of 3 mmHg.   Recent Labs: 03/21/2019: B Natriuretic Peptide 261.6 03/22/2019: TSH 1.220 03/24/2019: ALT 15 03/27/2019: Hemoglobin 14.8; Magnesium 2.0; Platelets 276 06/10/2019: BUN 15; Creatinine, Ser 1.01; Potassium 4.2; Sodium 139   Recent Lipid Panel    Component Value Date/Time   CHOL 159 03/23/2019 1253   TRIG 105 03/23/2019 1253   HDL 32 (L) 03/23/2019 1253   CHOLHDL 5.0 03/23/2019 1253   VLDL 21 03/23/2019 1253   LDLCALC 106 (H) 03/23/2019 1253    Physical Exam:   VS:  BP 106/84   Pulse 70   Ht 5\' 11"  (1.803 m)   Wt (!) 312 lb (141.5 kg)   SpO2 97%   BMI 43.52 kg/m    Wt Readings from Last 3 Encounters:  03/06/20 (!) 312 lb (141.5 kg)  12/06/19 (!) 312 lb 12.8 oz (141.9 kg)  09/06/19 (!) 313 lb (142 kg)    General: Well nourished,  well developed, in no acute distress Heart: Atraumatic, normal size  Eyes: PEERLA, EOMI  Neck: Supple, no JVD Endocrine: No thryomegaly Cardiac: Normal S1, S2; RRR; no murmurs, rubs, or gallops Lungs: Clear to auscultation bilaterally, no wheezing, rhonchi or rales  Abd: Soft, nontender, no hepatomegaly  Ext: No edema, pulses 2+ Musculoskeletal: No deformities, BUE and BLE strength normal and equal Skin: Warm and dry, no rashes   Neuro: Alert and oriented to person, place, time, and situation, CNII-XII grossly intact, no focal deficits  Psych: Normal mood and affect   ASSESSMENT:   MARTEZE VECCHIO is a 59 y.o. male who presents for the following: 1. Chronic systolic heart failure (HCC)   2. Persistent atrial fibrillation ([place])   3. Essential hypertension   4. Obesity, morbid, BMI 40.0-49.9 ([place])     PLAN:   1. Chronic systolic heart failure (HCC) -EF has recovered.  Euvolemic.  Refill medications.  We will check his kidney panel today. -Diagnosed with systolic heart failure in the setting of uncontrolled A. fib.  EF has recovered.  Overall euvolemic on exam.  Continue Lasix 80 mg every other day.  He will continue his metoprolol succinate 100 mg daily, Entresto 97-103 mg twice daily, Aldactone 25 mg daily.  We will send his patient assistance form into [employer].  2. Persistent atrial fibrillation (HCC) -No recurrence of A. fib.  Refill Eliquis.  In sinus rhythm today.  Not wanting to pursue ablation.  I discussed cardiomyopathy with him.  He will continue monitoring his heart rhythm at home.  3. Essential hypertension -Well-controlled on blood pressure medications.  4. Obesity, morbid, BMI 40.0-49.9 ([place]) -Advised to lose weight.  Doing well.   Disposition: Return in about 6 months (around 09/04/2020).  Medication Adjustments/Labs and Tests Ordered: Current medicines are reviewed at length with the patient today.  Concerns regarding medicines are outlined above.  Orders Placed  This Encounter  Procedures  . Basic metabolic panel  . EKG 12-Lead   Meds ordered this encounter  Medications  . apixaban (ELIQUIS) 5 MG TABS tablet    Sig: Take 1 tablet (5 mg total) by mouth 2 (two) times daily.  Dispense:  180 tablet    Refill:  3  . atorvastatin (LIPITOR) 40 MG tablet    Sig: Take 1 tablet (40 mg total) by mouth daily at 6 PM.    Dispense:  90 tablet    Refill:  3  . furosemide (LASIX) 80 MG tablet    Sig: Take 1 tablet (80 mg total) by mouth every other day.    Dispense:  90 tablet    Refill:  0  . metoprolol succinate (TOPROL-XL) 100 MG 24 hr tablet    Sig: Take 1 tablet (100 mg total) by mouth daily. Take with or immediately following a meal.    Dispense:  90 tablet    Refill:  3  . spironolactone (ALDACTONE) 25 MG tablet    Sig: Take 1 tablet (25 mg total) by mouth daily.    Dispense:  90 tablet    Refill:  3  . sacubitril-valsartan (ENTRESTO) 97-103 MG    Sig: Take 1 tablet by mouth 2 (two) times daily.    Dispense:  60 tablet    Refill:  11    Patient Instructions  Medication Instructions:  Your medications have been refilled today *If you need a refill on your cardiac medications before your next appointment, please call your pharmacy*   Lab Work: BMET Today If you have labs (blood work) drawn today and your tests are completely normal, you will receive your results only by: Marland Kitchen MyChart Message (if you have MyChart) OR . A paper copy in the mail If you have any lab test that is abnormal or we need to change your treatment, we will call you to review the results.   Testing/Procedures: None ordered   Follow-Up: At [facility], you and your health needs are our priority.  As part of our continuing mission to provide you with exceptional heart care, we have created designated Provider Care Teams.  These Care Teams include your primary Cardiologist (physician) and Advanced Practice Providers (APPs -  Physician Assistants and Nurse  Practitioners) who all work together to provide you with the care you need, when you need it.  We recommend signing up for the patient portal called "MyChart".  Sign up information is provided on this After Visit Summary.  MyChart is used to connect with patients for Virtual Visits (Telemedicine).  Patients are able to view lab/test results, encounter notes, upcoming appointments, etc.  Non-urgent messages can be sent to your provider as well.   To learn more about what you can do with MyChart, go to [email or web address].    Your next appointment:   6 month(s)  The format for your next appointment:   In Person  Provider:   Eleonore Chiquito, MD   Other Instructions We will fax your [facility] paperwork for you today. Please keep your copy for your records.     Time Spent with Patient: I have spent a total of 35 minutes with patient reviewing hospital notes, telemetry, EKGs, labs and examining the patient as well as establishing an assessment and plan that was discussed with the patient.  > 50% of time was spent in direct patient care.  Signed, Addison Naegeli. Audie Box, [place]  [address], [place], [place] [address] [phone]  03/06/2020 9:50 AM

## 2020-03-06 ENCOUNTER — Other Ambulatory Visit: Payer: Self-pay

## 2020-03-06 ENCOUNTER — Ambulatory Visit (INDEPENDENT_AMBULATORY_CARE_PROVIDER_SITE_OTHER): Payer: BC Managed Care – PPO | Admitting: Cardiovascular Disease

## 2020-03-06 ENCOUNTER — Encounter: Payer: Self-pay | Admitting: Cardiovascular Disease

## 2020-03-06 ENCOUNTER — Telehealth: Payer: Self-pay

## 2020-03-06 VITALS — BP 106/84 | HR 70 | Ht 71.0 in | Wt 312.0 lb

## 2020-03-06 DIAGNOSIS — I4819 Other persistent atrial fibrillation: Secondary | ICD-10-CM

## 2020-03-06 DIAGNOSIS — I10 Essential (primary) hypertension: Secondary | ICD-10-CM

## 2020-03-06 DIAGNOSIS — I5022 Chronic systolic (congestive) heart failure: Secondary | ICD-10-CM | POA: Diagnosis not present

## 2020-03-06 LAB — BASIC METABOLIC PANEL
BUN/Creatinine Ratio: 17 (ref 10–24)
BUN: 18 mg/dL (ref 8–27)
CO2: 22 mmol/L (ref 20–29)
Calcium: 9.5 mg/dL (ref 8.6–10.2)
Chloride: 104 mmol/L (ref 96–106)
Creatinine, Ser: 1.08 mg/dL (ref 0.76–1.27)
GFR calc Af Amer: 86 mL/min/{1.73_m2} (ref >59)
GFR calc non Af Amer: 74 mL/min/{1.73_m2} (ref >59)
Glucose: 88 mg/dL (ref 65–99)
Potassium: 4.4 mmol/L (ref 3.5–5.2)
Sodium: 140 mmol/L (ref 134–144)

## 2020-03-06 MED ORDER — METOPROLOL SUCCINATE ER 100 MG PO TB24: 100.0000 mg | ORAL_TABLET | Freq: Every day | ORAL | 3 refills | Status: DC

## 2020-03-06 MED ORDER — SPIRONOLACTONE 25 MG PO TABS: 25.0000 mg | ORAL_TABLET | Freq: Every day | ORAL | 3 refills | Status: DC

## 2020-03-06 MED ORDER — ATORVASTATIN CALCIUM 40 MG PO TABS: 40.0000 mg | ORAL_TABLET | Freq: Every day | ORAL | 3 refills | Status: DC

## 2020-03-06 MED ORDER — SACUBITRIL-VALSARTAN 97-103 MG PO TABS: 1.0000 | ORAL_TABLET | Freq: Two times a day (BID) | ORAL | 11 refills | Status: DC

## 2020-03-06 MED ORDER — APIXABAN 5 MG PO TABS: 5.0000 mg | ORAL_TABLET | Freq: Two times a day (BID) | ORAL | 3 refills | Status: DC

## 2020-03-06 MED ORDER — FUROSEMIDE 80 MG PO TABS: 80.0000 mg | ORAL_TABLET | ORAL | 0 refills | Status: DC

## 2020-03-06 NOTE — Telephone Encounter (Signed)
Faxed completed patient assistance forms completed by Dr. Audie Box to [employer] Patient [place].

## 2020-03-06 NOTE — Patient Instructions (Signed)
Medication Instructions:  Your medications have been refilled today *If you need a refill on your cardiac medications before your next appointment, please call your pharmacy*   Lab Work: BMET Today If you have labs (blood work) drawn today and your tests are completely normal, you will receive your results only by: Marland Kitchen MyChart Message (if you have MyChart) OR . A paper copy in the mail If you have any lab test that is abnormal or we need to change your treatment, we will call you to review the results.   Testing/Procedures: None ordered   Follow-Up: At [facility], you and your health needs are our priority.  As part of our continuing mission to provide you with exceptional heart care, we have created designated Provider Care Teams.  These Care Teams include your primary Cardiologist (physician) and Advanced Practice Providers (APPs -  Physician Assistants and Nurse Practitioners) who all work together to provide you with the care you need, when you need it.  We recommend signing up for the patient portal called "MyChart".  Sign up information is provided on this After Visit Summary.  MyChart is used to connect with patients for Virtual Visits (Telemedicine).  Patients are able to view lab/test results, encounter notes, upcoming appointments, etc.  Non-urgent messages can be sent to your provider as well.   To learn more about what you can do with MyChart, go to [email or web address].    Your next appointment:   6 month(s)  The format for your next appointment:   In Person  Provider:   Eleonore Chiquito, MD   Other Instructions We will fax your [facility] paperwork for you today. Please keep your copy for your records.

## 2020-03-09 NOTE — Telephone Encounter (Addendum)
Received fax from [employer] patient assistance-patient approved until 05/01/2020 for [employer]

## 2020-03-10 ENCOUNTER — Telehealth: Payer: Self-pay | Admitting: Cardiovascular Disease

## 2020-03-10 NOTE — Telephone Encounter (Signed)
New message:     Patient stating that the doctor filled out a form for him to get  [facility] and the doctor put the wrong year on form. Patient need a new form filled out and sent back to fax [phone] and the number [phone]

## 2020-03-10 NOTE — Telephone Encounter (Signed)
Spoke to patient-patient states he called [employer] this morning and was told the date beside MD signature was 03/07/2019 and they need this refaxed with correct date.     Advised received fax from [employer] yesterday stating he was approved.  Advised will locate paperwork, correct date and fax to [employer].   Updated provider form faxed to [employer]

## 2020-05-09 ENCOUNTER — Telehealth: Payer: Self-pay | Admitting: Cardiovascular Disease

## 2020-05-09 ENCOUNTER — Encounter: Payer: Self-pay | Admitting: Cardiovascular Disease

## 2020-05-09 MED ORDER — APIXABAN 5 MG PO TABS
5.0000 mg | ORAL_TABLET | Freq: Two times a day (BID) | ORAL | 3 refills | Status: DC
Start: 2020-05-09 — End: 2020-08-29

## 2020-05-09 MED ORDER — RIVAROXABAN 20 MG PO TABS: 20.0000 mg | ORAL_TABLET | Freq: Every day | ORAL | 3 refills | Status: DC

## 2020-05-09 NOTE — Telephone Encounter (Signed)
Spoke with patient. Informed patient Dr. Audie Box approved the change from Eliquis to Xarelto. Reviewed instructions. Patient verbalized understanding. Prescription sent to [facility].

## 2020-05-09 NOTE — Telephone Encounter (Signed)
Pt c/o medication issue:  1. Name of Medication: apixaban (ELIQUIS) 5 MG TABS tablet  2. How are you currently taking this medication (dosage and times per day)? As directed   3. Are you having a reaction (difficulty breathing--STAT)? Cost issue  4. What is your medication issue? Pharmacy wanted to know if the patient could switch from Eliquis to xarelto as a more cost effective medication. His pharmacy said his insurance will not cover the copay for the eliquis but they will cover the xarelto. Please advise

## 2020-05-09 NOTE — Telephone Encounter (Signed)
Spoke with patient. Patient no longer wants to change medications, he thought about it and he's not having any issues with Eliquis and would rather not start Xarelto. Patient spoke with pharmacy and pharmacy will send an appeal form to patient so that he can stay on Eliquis. Xarelto cancelled and Eliquis re-ordered.

## 2020-05-09 NOTE — Telephone Encounter (Signed)
Please switch to xarelto 20 mg daily.   [place] T. Audie Box, [place]  [address] [place], [place] [address] [phone]  8:30 AM

## 2020-05-09 NOTE — Telephone Encounter (Signed)
Spoke with patient. He received a letter from [place] stating Eliquis is no longer covered by his insurance. The letter informs him that Xarelto is the covered and patient would like to change to avoid increased costs. Will route to MD for recommendations.

## 2020-05-09 NOTE — Telephone Encounter (Signed)
Follow up   Pt is calling back to speak to [facility] Pt says he wants to stay taking the Eliquis. [facility] advised him they have paperwork he can turn in for assistance  Pt says the Xarelto can be cancelled   Please advise

## 2020-05-09 NOTE — Telephone Encounter (Signed)
Thanks.   -W

## 2020-05-09 NOTE — Telephone Encounter (Signed)
error 

## 2020-05-09 NOTE — Addendum Note (Signed)
Addended by: Sherrie Mustache on: 05/09/2020 10:08 AM   Modules accepted: Orders

## 2020-06-05 ENCOUNTER — Ambulatory Visit: Payer: BC Managed Care – PPO | Admitting: Dermatology

## 2020-07-05 ENCOUNTER — Telehealth: Payer: Self-pay

## 2020-07-05 NOTE — Telephone Encounter (Signed)
Received documentation form [facility] that patient has wellness requirements for his medical insurance- dates that this should be completed is 07/17/2020-07/18/2020.  Advised with patient that we would need blood work, and could be completed by [place] or PCP. Left call back number for patient to let us know where he would like this to be done at so we can get scheduled.   Advised patient to call back.

## 2020-07-07 NOTE — Telephone Encounter (Signed)
Routed to [place] LPN

## 2020-07-07 NOTE — Telephone Encounter (Signed)
Parker Calderon is calling stating these dates are wrong. The correct dates are 07/19/19-07/17/20. She is requesting she get a callback to discuss further instead of her husband due to him being confused. Please advise.

## 2020-07-07 NOTE — Telephone Encounter (Signed)
Called patients wife- advised that I had misread the forms, it was form last year to this year.  I did advise we did not have any recent lipid levels for the forms, so he would have to do blood work, and wife states they will just do it for next year and not worry about it for this year. Patient wife thankful for call back, will let husband know he is coming due for his 6 month follow up in April and he will call to schedule.

## 2020-07-07 NOTE — Telephone Encounter (Signed)
Parker Calderon is calling stating the dates for the health wellness forms are okay to be faxed. Please advise.

## 2020-08-28 ENCOUNTER — Telehealth: Payer: Self-pay | Admitting: Cardiovascular Disease

## 2020-08-28 NOTE — Telephone Encounter (Signed)
Pt states he spoke with someone Friday about faxing some paperwork to be completed by  Dr. Audie Box. Please Advise

## 2020-08-28 NOTE — Telephone Encounter (Signed)
Patient is following up. He requested a call back to ensure that his fax was received. He states he sent it in around 8:30-9:00 AM this morning.

## 2020-08-29 ENCOUNTER — Other Ambulatory Visit: Payer: Self-pay | Admitting: Pharmacist

## 2020-08-29 MED ORDER — APIXABAN 5 MG PO TABS
5.0000 mg | ORAL_TABLET | Freq: Two times a day (BID) | ORAL | 1 refills | Status: DC
Start: 2020-08-29 — End: 2021-03-22

## 2020-08-29 NOTE — Telephone Encounter (Signed)
Pt is returning call yesterday in regards to paperwork being faxed. Pt requested a return call to ensure the paperwork has been received.

## 2020-08-29 NOTE — Telephone Encounter (Signed)
Called patient, received patient assistance information- he would like it to be filled out and faxed today.  Advised I would send it today.   Patient verbalized understanding, thankful for call back.

## 2020-09-01 ENCOUNTER — Other Ambulatory Visit: Payer: Self-pay

## 2020-09-01 MED ORDER — SACUBITRIL-VALSARTAN 97-103 MG PO TABS: 1.0000 | ORAL_TABLET | Freq: Two times a day (BID) | ORAL | 0 refills | Status: AC

## 2020-09-01 NOTE — Telephone Encounter (Signed)
Can you help with this?  [place] T. Audie Box, MD, [place]  [address], [place], [place] [address] [phone]  9:41 AM

## 2020-09-01 NOTE — Telephone Encounter (Signed)
Patient stated that the fax wasn't received and he needs both his medicine fill the apixaban (ELIQUIS) 5 MG TABS tablet and sacubitril-valsartan (ENTRESTO) 97-103 MG. He states he dont have any left. Need this to be done today

## 2020-09-01 NOTE — Telephone Encounter (Signed)
Called patient, advised I had re-faxed paperwork today- also advised that I had samples of the Eliquis up front for him to pick up, but did not have any of the Entresto- I did send an emergency supply to his pharmacy while we wait for his patient assistance paperwork to be approved. I advised patient to let me know if they did not receive the paperwork this time.  Patient verbalized understanding. Thankful for call back.

## 2020-09-06 NOTE — Telephone Encounter (Signed)
Called patient to advise that I had received notification from [employer] to have it cover his Eliquis.  The [employer] paperwork was sent in for his Entresto medication.   Advised patient to call back to discuss, LVM with call back number.

## 2020-09-08 NOTE — Telephone Encounter (Signed)
Patient returned call on 04/20- he would like the patient assistance mailed to him to go ahead and start filling out so he can bring it to the upcoming appointment.  I will mail it out today.

## 2020-09-11 NOTE — Telephone Encounter (Signed)
Parker Calderon is calling stating he is returning Julie's call and states he did send portion of paper work about Eliquis.

## 2020-09-11 NOTE — Telephone Encounter (Signed)
Routed to primary nurse 

## 2020-09-12 NOTE — Telephone Encounter (Signed)
Patient calling back.   °

## 2020-09-12 NOTE — Telephone Encounter (Signed)
Called patient, advised that I did receive his paperwork- we will fill out our portion and we will send it back in.   Patient verbalized understanding.

## 2020-09-12 NOTE — Telephone Encounter (Signed)
Routed to Julie LPN 

## 2020-09-13 NOTE — Progress Notes (Signed)
Cardiology Office Note:   Date:  09/14/2020  NAME:  Parker Calderon    MRN: [id number] DOB:  [birth year]   PCP:  Shirline Frees, MD  Cardiologist:  Evalina Field, MD   Referring MD: Shirline Frees, MD   Chief Complaint  Patient presents with  . Congestive Heart Failure   History of Present Illness:   Parker Calderon is a 60 y.o. male with a hx of systolic HF with recovery of EF, persistent Afib s/p DCCV, obesity, HTN who presents for follow-up. Has been doing well. Deferred Afib ablation.  Weights are up 10 pounds.  He has no evidence of increased volume overload.  He is doing fairly well.  Denies any symptoms of chest pain or shortness of breath.  He informs me that he is stable on his current medications.  He does have a varicose vein in the right calf and would like referral to the vein and vascular specialist.  His blood pressure is 140/80.  He just got off work.  He works third shift in [place].  He reports that he took his medications yesterday.  BPs have been okay at home.  He is not exercising.  Plans to do more in the summer months.  Nonetheless, denies any symptoms of congestive heart failure.  He reports that he is working on his diet.  He apparently has stray from the plan of avoiding fatty foods as well as sodas.  He will start to work on this better.  Overall appears to be stable and doing well.  Needs to work on his diet.  Problem List: 1. Non-ischemic CM (EF10-15%, 03/2019) -Normal coronaries on LHC 03/25/2019 -EF 55-60% 11/29/2019 3. Atrial fibrillation  -s/p DCCV 03/2019 -recurrence 06/25/2019 -maintaining sinus rhythm off amiodarone  -CHADSVASC=2 4. Morbid obesity(BMI 44) 5. Hypertension  Past Medical History: Past Medical History:  Diagnosis Date  . Arthritis    Rhinitis  . Atrial fibrillation ([place])   . CHF (congestive heart failure) ([place])   . Hyperlipidemia   . Hypertension   . Irritable bowel syndrome   . Left thigh pain    Lateral, aching  . Obesity,  unspecified   . Varicose veins     Past Surgical History: Past Surgical History:  Procedure Laterality Date  . CARDIAC CATHETERIZATION    . CARDIOVERSION N/A 03/26/2019   Procedure: CARDIOVERSION;  Surgeon: Josue Hector, MD;  Location: [facility] ENDOSCOPY;  Service: Cardiovascular;  Laterality: N/A;  . RIGHT/LEFT HEART CATH AND CORONARY ANGIOGRAPHY N/A 03/25/2019   Procedure: RIGHT/LEFT HEART CATH AND CORONARY ANGIOGRAPHY;  Surgeon: Martinique, Peter M, MD;  Location: [place] CV LAB;  Service: Cardiovascular;  Laterality: N/A;  . TEE WITHOUT CARDIOVERSION N/A 03/26/2019   Procedure: TRANSESOPHAGEAL ECHOCARDIOGRAM (TEE);  Surgeon: Josue Hector, MD;  Location: [facility] ENDOSCOPY;  Service: Cardiovascular;  Laterality: N/A;    Current Medications: Current Meds  Medication Sig  . apixaban (ELIQUIS) 5 MG TABS tablet Take 1 tablet (5 mg total) by mouth 2 (two) times daily.  Marland Kitchen atorvastatin (LIPITOR) 40 MG tablet Take 1 tablet (40 mg total) by mouth daily at 6 PM.  . furosemide (LASIX) 80 MG tablet Take 1 tablet (80 mg total) by mouth every other day.  . metoprolol succinate (TOPROL-XL) 100 MG 24 hr tablet Take 1 tablet (100 mg total) by mouth daily. Take with or immediately following a meal.  . Multiple Vitamin (MULTIVITAMIN) tablet Take 1 tablet by mouth daily.  . sacubitril-valsartan (ENTRESTO) 97-103 MG Take 1  tablet by mouth 2 (two) times daily.  Marland Kitchen spironolactone (ALDACTONE) 25 MG tablet Take 1 tablet (25 mg total) by mouth daily.     Allergies:    Patient has no known allergies.   Social History: Social History   Socioeconomic History  . Marital status: Single    Spouse name: Not on file  . Number of children: Not on file  . Years of education: Not on file  . Highest education level: High school graduate  Occupational History  . Not on file  Tobacco Use  . Smoking status: Never Smoker  . Smokeless tobacco: Never Used  Vaping Use  . Vaping Use: Not on file  Substance and Sexual  Activity  . Alcohol use: No  . Drug use: No  . Sexual activity: Not on file  Other Topics Concern  . Not on file  Social History Narrative  . Not on file   Social Determinants of Health   Financial Resource Strain: Not on file  Food Insecurity: Not on file  Transportation Needs: Not on file  Physical Activity: Not on file  Stress: Not on file  Social Connections: Not on file     Family History: The patient's family history includes High blood pressure in his mother; Kidney failure in his father.  ROS:   All other ROS reviewed and negative. Pertinent positives noted in the HPI.     EKGs/Labs/Other Studies Reviewed:   The following studies were personally reviewed by me today:  TTE 11/29/2019 1. Since the last study on 03/22/2019 LVEF has improved from 20% to  55-60%.  2. Left ventricular ejection fraction, by estimation, is 55 to 60%. The  left ventricle has normal function. The left ventricle has no regional  wall motion abnormalities. There is moderate concentric left ventricular  hypertrophy. Left ventricular  diastolic parameters are consistent with Grade II diastolic dysfunction  (pseudonormalization).  3. Right ventricular systolic function is normal. The right ventricular  size is normal. There is normal pulmonary artery systolic pressure.  4. Left atrial size was mildly dilated.  5. The mitral valve is normal in structure. Mild mitral valve  regurgitation. No evidence of mitral stenosis.  6. The aortic valve is normal in structure. Aortic valve regurgitation is  not visualized. No aortic stenosis is present.  7. The inferior vena cava is normal in size with greater than 50%  respiratory variability, suggesting right atrial pressure of 3 mmHg.   Recent Labs: 03/06/2020: BUN 18; Creatinine, Ser 1.08; Potassium 4.4; Sodium 140   Recent Lipid Panel    Component Value Date/Time   CHOL 159 03/23/2019 1253   TRIG 105 03/23/2019 1253   HDL 32 (L) 03/23/2019  1253   CHOLHDL 5.0 03/23/2019 1253   VLDL 21 03/23/2019 1253   LDLCALC 106 (H) 03/23/2019 1253    Physical Exam:   VS:  BP 140/80 (BP Location: Left Arm, Patient Position: Sitting, Cuff Size: Large)   Pulse (!) 55   Ht 5\' 11"  (1.803 m)   Wt (!) 323 lb (146.5 kg)   SpO2 97%   BMI 45.05 kg/m    Wt Readings from Last 3 Encounters:  09/14/20 (!) 323 lb (146.5 kg)  03/06/20 (!) 312 lb (141.5 kg)  12/06/19 (!) 312 lb 12.8 oz (141.9 kg)    General: Well nourished, well developed, in no acute distress Head: Atraumatic, normal size  Eyes: PEERLA, EOMI  Neck: Supple, no JVD Endocrine: No thryomegaly Cardiac: Normal S1, S2; RRR; no murmurs,  rubs, or gallops Lungs: Clear to auscultation bilaterally, no wheezing, rhonchi or rales  Abd: Soft, nontender, no hepatomegaly  Ext: No edema, pulses 2+ Musculoskeletal: No deformities, BUE and BLE strength normal and equal Skin: Warm and dry, no rashes   Neuro: Alert and oriented to person, place, time, and situation, CNII-XII grossly intact, no focal deficits  Psych: Normal mood and affect   ASSESSMENT:   Parker Calderon is a 60 y.o. male who presents for the following: 1. Chronic systolic heart failure (HCC)   2. Persistent atrial fibrillation ([place])   3. Essential hypertension   4. Obesity, morbid, BMI 40.0-49.9 ([place])   5. 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 Chronic systolic heart failure (HCC) -Diagnosed with congestive heart failure November 2020.  Left heart catheterization normal.  Was found to be in rapid atrial fibrillation.  Suspect this was the etiology of his cardiomyopathy.  EF has normalized since he was cardioverted.  He deferred on A. fib ablation.  Pulse is regular examination.  I doubt he has had recurrence of his A. Fib. -Doing well without symptoms.  He will continue his current medication regimen which includes metoprolol succinate 100 mg daily, Entresto  97-103 mg twice daily, Aldactone 25 mg daily.  He is on Lasix 80 mg every other day.  He is euvolemic on examination.  Given his stability I see no need for Jardiance.  2. Persistent atrial fibrillation (HCC) -No recurrence of atrial fibrillation.  Status post cardioversion.  Completed a course of amiodarone.  We have stopped this. -CHADSVASC=2 (HTN, CHF).  Continue Eliquis 5 mg twice daily -He has deferred on A. fib ablation.  I think this is reasonable as he has had no recurrence of atrial fibrillation.  We will continue to see him every 6 months to look for recurrence of A. fib.  3. Essential hypertension -Well-controlled on current medications.  4. Obesity, morbid, BMI 40.0-49.9 ([place]) -Diet and exercise recommended.  5. 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  Referral to vein and vascular specialist.  Disposition: Return in about 6 months (around 03/16/2021).  Medication Adjustments/Labs and Tests Ordered: Current medicines are reviewed at length with the patient today.  Concerns regarding medicines are outlined above.  Orders Placed This Encounter  Procedures  . Ambulatory referral to Vascular Surgery   No orders of the defined types were placed in this encounter.   Patient Instructions  Medication Instructions:  The current medical regimen is effective;  continue present plan and medications.  *If you need a refill on your cardiac medications before your next appointment, please call your pharmacy*   Follow-Up: At [facility], you and your health needs are our priority.  As part of our continuing mission to provide you with exceptional heart care, we have created designated Provider Care Teams.  These Care Teams include your primary Cardiologist (physician) and Advanced Practice Providers (APPs -  Physician Assistants and Nurse Practitioners) who all work together to provide you with  the care you need, when you need it.  We recommend signing up for the patient portal called "MyChart".  Sign up information is provided on this After Visit Summary.  MyChart is used to connect with patients for Virtual Visits (Telemedicine).  Patients are able to view lab/test results, encounter notes, upcoming appointments, etc.  Non-urgent messages can be sent to your provider as well.   To learn more about what you can do with MyChart, go to [email or web address].    Your next appointment:   6 month(s)  The format for your next appointment:   In Person  Provider:   Eleonore Chiquito, MD   Other Instructions Referral to [place] with Patient: I have spent a total of 35 minutes with patient reviewing hospital notes, telemetry, EKGs, labs and examining the patient as well as establishing an assessment and plan that was discussed with the patient.  > 50% of time was spent in direct patient care.  Signed, Addison Naegeli. Audie Box, MD, [place]  [address], [place], [place] [address] [phone]  09/14/2020 9:01 AM

## 2020-09-14 ENCOUNTER — Other Ambulatory Visit: Payer: Self-pay

## 2020-09-14 ENCOUNTER — Encounter: Payer: Self-pay | Admitting: Cardiovascular Disease

## 2020-09-14 ENCOUNTER — Ambulatory Visit (INDEPENDENT_AMBULATORY_CARE_PROVIDER_SITE_OTHER): Payer: BC Managed Care – PPO | Admitting: Cardiovascular Disease

## 2020-09-14 VITALS — BP 140/80 | HR 55 | Ht 71.0 in | Wt 323.0 lb

## 2020-09-14 DIAGNOSIS — I4819 Other persistent atrial fibrillation: Secondary | ICD-10-CM | POA: Diagnosis not present

## 2020-09-14 DIAGNOSIS — I10 Essential (primary) hypertension: Secondary | ICD-10-CM

## 2020-09-14 DIAGNOSIS — I5022 Chronic systolic (congestive) heart failure: Secondary | ICD-10-CM

## 2020-09-14 DIAGNOSIS — I83022 Varicose veins of left lower extremity with ulcer of calf: Secondary | ICD-10-CM

## 2020-09-14 DIAGNOSIS — L97221 Non-pressure chronic ulcer of left calf limited to breakdown of skin: Secondary | ICD-10-CM

## 2020-09-14 NOTE — Patient Instructions (Addendum)
Medication Instructions:  The current medical regimen is effective;  continue present plan and medications.  *If you need a refill on your cardiac medications before your next appointment, please call your pharmacy*   Follow-Up: At [facility], you and your health needs are our priority.  As part of our continuing mission to provide you with exceptional heart care, we have created designated Provider Care Teams.  These Care Teams include your primary Cardiologist (physician) and Advanced Practice Providers (APPs -  Physician Assistants and Nurse Practitioners) who all work together to provide you with the care you need, when you need it.  We recommend signing up for the patient portal called "MyChart".  Sign up information is provided on this After Visit Summary.  MyChart is used to connect with patients for Virtual Visits (Telemedicine).  Patients are able to view lab/test results, encounter notes, upcoming appointments, etc.  Non-urgent messages can be sent to your provider as well.   To learn more about what you can do with MyChart, go to [email or web address].    Your next appointment:   6 month(s)  The format for your next appointment:   In Person  Provider:   Eleonore Chiquito, MD   Other Instructions Referral to [place]

## 2020-09-15 ENCOUNTER — Encounter (HOSPITAL_COMMUNITY): Payer: Self-pay

## 2020-09-15 ENCOUNTER — Ambulatory Visit (HOSPITAL_COMMUNITY)
Admission: EM | Admit: 2020-09-15 | Discharge: 2020-09-15 | Disposition: A | Discharge status: Home / Self Care | Payer: BC Managed Care – PPO | Attending: Emergency Medicine | Admitting: Emergency Medicine

## 2020-09-15 ENCOUNTER — Other Ambulatory Visit: Payer: Self-pay

## 2020-09-15 DIAGNOSIS — S61011A Laceration without foreign body of right thumb without damage to nail, initial encounter: Secondary | ICD-10-CM

## 2020-09-15 DIAGNOSIS — Z23 Encounter for immunization: Secondary | ICD-10-CM | POA: Diagnosis not present

## 2020-09-15 MED ORDER — TETANUS-DIPHTH-ACELL PERTUSSIS 5-2.5-18.5 LF-MCG/0.5 IM SUSY
PREFILLED_SYRINGE | INTRAMUSCULAR | Status: AC
  Filled 2020-09-15: qty 0.5

## 2020-09-15 MED ORDER — TETANUS-DIPHTH-ACELL PERTUSSIS 5-2.5-18.5 LF-MCG/0.5 IM SUSY
0.5000 mL | PREFILLED_SYRINGE | Freq: Once | INTRAMUSCULAR | Status: AC
  Administered 2020-09-15: 0.5 mL via INTRAMUSCULAR

## 2020-09-15 NOTE — Discharge Instructions (Signed)
Keep glue on as long as possible, ideally 5 days at least.  It will eventually peel off on their own.  May get wet but do not soak or agitate to avoid pre-mature removal of the glue.  Once off, if still with some wound cleanse daily with soap and water and keep covered to keep clean.  Return for any signs of infection- redness, warmth, pain, or pus drainage.

## 2020-09-15 NOTE — ED Triage Notes (Signed)
Pt on eliquis, was cutting fruit about 30 minutes ago and sustained approx .75 inch laceration to right thumb next to nail. Bleeding controlled with gauze bandage. Has not washed laceration yet.

## 2020-09-15 NOTE — ED Provider Notes (Signed)
[place]    CSN: [id number] Arrival date & time: 09/15/20  1652      History   Chief Complaint Chief Complaint  Patient presents with  . Laceration    HPI Parker Calderon is a 60 y.o. male.   Parker Calderon presents with complaints of laceration to right thumb which he obtained just prior to arrival. He was cutting fruit and the knife slipped and cut his finger. He is on eliquis so has had bleeding. Minimal pain. Hasn't cleansed the wound, came straight here. Unknown last tdap.     ROS per HPI, negative if not otherwise mentioned.      Past Medical History:  Diagnosis Date  . Arthritis    Rhinitis  . Atrial fibrillation ([place])   . CHF (congestive heart failure) ([place])   . Hyperlipidemia   . Hypertension   . Irritable bowel syndrome   . Left thigh pain    Lateral, aching  . Obesity, unspecified   . Varicose veins     Patient Active Problem List   Diagnosis Date Noted  . Persistent atrial fibrillation ([place]) 06/25/2019  . Secondary hypercoagulable state ([place]) 06/25/2019  . HFrEF (heart failure with reduced ejection fraction) ([place]) 04/21/2019  . Acute systolic CHF (congestive heart failure) ([place])   . Atrial fibrillation with rapid ventricular response ([place]) 03/22/2019  . Volume overload 03/22/2019  . Elevated blood pressure reading 03/22/2019  . HLD (hyperlipidemia) 03/22/2019  . Prolonged QT interval 03/22/2019  . Varicose veins of lower extremities with other complications 69/67/8938  . Chronic venous insufficiency 01/08/2013    Past Surgical History:  Procedure Laterality Date  . CARDIAC CATHETERIZATION    . CARDIOVERSION N/A 03/26/2019   Procedure: CARDIOVERSION;  Surgeon: Josue Hector, MD;  Location: [facility] ENDOSCOPY;  Service: Cardiovascular;  Laterality: N/A;  . RIGHT/LEFT HEART CATH AND CORONARY ANGIOGRAPHY N/A 03/25/2019   Procedure: RIGHT/LEFT HEART CATH AND CORONARY ANGIOGRAPHY;  Surgeon: Martinique, Peter M, MD;  Location: [place] CV LAB;   Service: Cardiovascular;  Laterality: N/A;  . TEE WITHOUT CARDIOVERSION N/A 03/26/2019   Procedure: TRANSESOPHAGEAL ECHOCARDIOGRAM (TEE);  Surgeon: Josue Hector, MD;  Location: [facility] ENDOSCOPY;  Service: Cardiovascular;  Laterality: N/A;       Home Medications    Prior to Admission medications   Medication Sig Start Date End Date Taking? Authorizing Provider  apixaban (ELIQUIS) 5 MG TABS tablet Take 1 tablet (5 mg total) by mouth 2 (two) times daily. 08/29/20  Yes O'Neal, Cassie Freer, MD  atorvastatin (LIPITOR) 40 MG tablet Take 1 tablet (40 mg total) by mouth daily at 6 PM. 03/06/20  Yes O'Neal, Cassie Freer, MD  furosemide (LASIX) 80 MG tablet Take 1 tablet (80 mg total) by mouth every other day. 03/06/20  Yes O'Neal, Cassie Freer, MD  metoprolol succinate (TOPROL-XL) 100 MG 24 hr tablet Take 1 tablet (100 mg total) by mouth daily. Take with or immediately following a meal. 03/06/20  Yes O'Neal, Cassie Freer, MD  Multiple Vitamin (MULTIVITAMIN) tablet Take 1 tablet by mouth daily.   Yes [provider]  sacubitril-valsartan (ENTRESTO) 97-103 MG Take 1 tablet by mouth 2 (two) times daily. 09/01/20  Yes O'Neal, Cassie Freer, MD  spironolactone (ALDACTONE) 25 MG tablet Take 1 tablet (25 mg total) by mouth daily. 03/06/20  Yes O'Neal, Cassie Freer, MD    Family History Family History  Problem Relation Age of Onset  . High blood pressure Mother   . Kidney failure Father  Social History Social History   Tobacco Use  . Smoking status: Never Smoker  . Smokeless tobacco: Never Used  Substance Use Topics  . Alcohol use: No  . Drug use: No     Allergies   Patient has no known allergies.   Review of Systems Review of Systems   Physical Exam Triage Vital Signs ED Triage Vitals  Enc Vitals Group     BP 09/15/20 1659 (!) 153/93     Pulse Rate 09/15/20 1659 77     Resp 09/15/20 1659 18     Temp 09/15/20 1659 97.7 F (36.5 C)     Temp src --      SpO2  09/15/20 1659 95 %     Weight --      Height --      Head Circumference --      Peak Flow --      Pain Score 09/15/20 1657 0     Pain Loc --      Pain Edu? --      Excl. in [place]? --    No data found.  Updated Vital Signs BP (!) 153/93   Pulse 77   Temp 97.7 F (36.5 C)   Resp 18   SpO2 95%   Visual Acuity Right Eye Distance:   Left Eye Distance:   Bilateral Distance:    Right Eye Near:   Left Eye Near:    Bilateral Near:     Physical Exam Constitutional:      Appearance: He is well-developed.  Cardiovascular:     Rate and Rhythm: Normal rate.  Pulmonary:     Effort: Pulmonary effort is normal.  Musculoskeletal:     Right hand: Laceration present. No bony tenderness. Normal range of motion. Normal strength.     Comments: Right hand lateral thumb with 0.5 cm laceration in v shape with skin flap intact, minimal bleeding; nail intact; no joint involvement; <21mm in depth  Skin:    General: Skin is warm and dry.  Neurological:     Mental Status: He is alert and oriented to person, place, and time.      UC Treatments / Results  Labs (all labs ordered are listed, but only abnormal results are displayed) Labs Reviewed - No data to display  EKG   Radiology No results found.  Procedures Laceration Repair  Date/Time: 09/15/2020 6:14 PM Performed by: Zigmund Gottron, NP Authorized by: Zigmund Gottron, NP   Consent:    Consent obtained:  Verbal   Consent given by:  Patient   Risks, benefits, and alternatives were discussed: yes     Risks discussed:  Infection, need for additional repair, pain, poor cosmetic result and poor wound healing   Alternatives discussed:  No treatment, referral and observation Universal protocol:    Procedure explained and questions answered to patient or proxy's satisfaction: yes     Patient identity confirmed:  Verbally with patient Anesthesia:    Anesthesia method:  None Laceration details:    Location:  Finger   Finger  location:  R thumb   Length (cm):  0.5   Depth (mm):  3 Exploration:    Hemostasis achieved with:  Direct pressure   Imaging outcome: foreign body not noted     Wound exploration: wound explored through full range of motion   Treatment:    Area cleansed with:  Shur-Clens and povidone-iodine   Amount of cleaning:  Standard Skin repair:    Repair  method:  Tissue adhesive Approximation:    Approximation:  Close Repair type:    Repair type:  Simple Post-procedure details:    Dressing:  Non-adherent dressing   Procedure completion:  Tolerated Comments:     Skin flap well adhering already with good approximation; glued down, tolerated well and with only minimal oozing of blood   (including critical care time)  Medications Ordered in UC Medications  Tdap (BOOSTRIX) injection 0.5 mL (has no administration in time range)    Initial Impression / Assessment and Plan / UC Course  I have reviewed the triage vital signs and the nursing notes.  Pertinent labs & imaging results that were available during my care of the patient were reviewed by me and considered in my medical decision making (see chart for details).     Wound glue to right lateral thumb near nail. Skin flap laceration with good approximation prior to intervention, opted to use glue, patient agreeable and tolerated. Return precautions provided. Patient verbalized understanding and agreeable to plan.   Final Clinical Impressions(s) / UC Diagnoses   Final diagnoses:  Laceration of right thumb without foreign body without damage to nail, initial encounter     Discharge Instructions     Keep glue on as long as possible, ideally 5 days at least.  It will eventually peel off on their own.  May get wet but do not soak or agitate to avoid pre-mature removal of the glue.  Once off, if still with some wound cleanse daily with soap and water and keep covered to keep clean.  Return for any signs of infection- redness, warmth,  pain, or pus drainage.       ED Prescriptions    None     PDMP not reviewed this encounter.   Zigmund Gottron, NP 09/15/20 [phone]

## 2020-09-15 NOTE — ED Notes (Signed)
[place], [place] to bedside to see pt.

## 2020-10-03 NOTE — Telephone Encounter (Signed)
Patient states he contacted [employer] to follow up on the status of his patient assistance request for Eliquis. Per patient, [employer] informed him that they have not received anything. He is requesting to speak with Dr. Kathalene Frames nurse for an update. Has the paperwork been sent in? Please advise.

## 2020-10-03 NOTE — Telephone Encounter (Signed)
Called patient, advised that I did have the forms and they were faxed on 04/26 with confirmation.  Patient requested they be faxed over again, and he would like a copy.  I have made copies placed up front, he will come pick up- and re-faxed over to patient assistance.  Patient thankful for call back.

## 2020-10-04 ENCOUNTER — Telehealth: Payer: Self-pay | Admitting: Cardiovascular Disease

## 2020-10-04 NOTE — Telephone Encounter (Signed)
Called patient, he states that the forms was missing a sheet that he had at home- and they all need to be sent together. I did advise he could bring me what was needed and I would fax it in. Patient will bring to the office today.

## 2020-10-04 NOTE — Telephone Encounter (Signed)
Patient stated the forms that was filled out were completed incorrectly. Patient states that he will bring the forms up to the office to be filled out correctly for his insurance for the medication. Please advise

## 2020-10-12 ENCOUNTER — Telehealth: Payer: Self-pay

## 2020-10-12 NOTE — Telephone Encounter (Signed)
Received notification that patients [place] Patient Assistance forms was approved for Eliquis until 10/09/2020.   Will scan into patients chart.

## 2020-12-08 ENCOUNTER — Emergency Department (HOSPITAL_COMMUNITY)
Admission: EM | Admit: 2020-12-08 | Discharge: 2020-12-08 | Disposition: A | Discharge status: Home / Self Care | Payer: BC Managed Care – PPO | Attending: Emergency Medicine | Admitting: Emergency Medicine

## 2020-12-08 ENCOUNTER — Telehealth: Payer: Self-pay | Admitting: Vascular Surgery

## 2020-12-08 DIAGNOSIS — I110 Hypertensive heart disease with heart failure: Secondary | ICD-10-CM | POA: Diagnosis not present

## 2020-12-08 DIAGNOSIS — I83892 Varicose veins of left lower extremities with other complications: Secondary | ICD-10-CM | POA: Diagnosis not present

## 2020-12-08 DIAGNOSIS — Z955 Presence of coronary angioplasty implant and graft: Secondary | ICD-10-CM | POA: Diagnosis not present

## 2020-12-08 DIAGNOSIS — Z79899 Other long term (current) drug therapy: Secondary | ICD-10-CM | POA: Insufficient documentation

## 2020-12-08 DIAGNOSIS — Z7901 Long term (current) use of anticoagulants: Secondary | ICD-10-CM | POA: Insufficient documentation

## 2020-12-08 DIAGNOSIS — I4891 Unspecified atrial fibrillation: Secondary | ICD-10-CM | POA: Diagnosis not present

## 2020-12-08 DIAGNOSIS — I83891 Varicose veins of right lower extremities with other complications: Secondary | ICD-10-CM | POA: Diagnosis not present

## 2020-12-08 DIAGNOSIS — I10 Essential (primary) hypertension: Secondary | ICD-10-CM | POA: Diagnosis not present

## 2020-12-08 DIAGNOSIS — I5021 Acute systolic (congestive) heart failure: Secondary | ICD-10-CM | POA: Insufficient documentation

## 2020-12-08 DIAGNOSIS — M79604 Pain in right leg: Secondary | ICD-10-CM | POA: Diagnosis not present

## 2020-12-08 MED ORDER — LIDOCAINE-EPINEPHRINE (PF) 2 %-1:200000 IJ SOLN
20.0000 mL | Freq: Once | INTRAMUSCULAR | Status: AC
  Administered 2020-12-08: 20 mL
  Filled 2020-12-08: qty 20

## 2020-12-08 NOTE — ED Notes (Signed)
Pt discharged and ambulated out of the ED without difficulty. 

## 2020-12-08 NOTE — ED Notes (Signed)
Dressing applied to pt's right lower leg.

## 2020-12-08 NOTE — Telephone Encounter (Signed)
I received a page from the patient at about 810PM this evening. My group has not seen the patient since 2014. At the time he had prominent varicosities, but no venous reflux. He was recently admitted by the cardiology service who recommended outpatient follow up with our group for varicose veins. He is on Eliquis for AFib.   The patient described having a "birds nest" about his ankle. I think he meant a tortuous varicosity. He reported to me he nicked the "birds nest" with a blade. He reported blood then "skeeted" out of the wound. He applied a bandage and the bleeding appeared to stop. I advised the patient present to the ER. He preferred to keep his bandage on because it was not soaking through. I continued to encourage him to go to the ER if he had bleeding on Eliquis, and was finally able to convince him to take the bandage down and look at the wound to see if there was any further bleeding. I instructed him to present to the nearest ER if he has ongoing bleeding.  Yevonne Aline. Stanford Breed, MD [facility] Phone Number: [phone] 12/08/2020 8:31 PM

## 2020-12-08 NOTE — ED Provider Notes (Signed)
Emergency Medicine Provider Triage Evaluation Note  Parker Calderon , a 60 y.o. male  was evaluated in triage.  Pt complains of bleeding from varicose vein. Onset tonight after shaving his legs.  Anticoagulated, but didn't take his meds today.  Denies feeling dizzy or SOB.  Review of Systems  Positive: Bleeding, cut Negative: Dizzy, SOB  Physical Exam  BP (!) 160/110 (BP Location: Left Arm)   Pulse 97   Temp 98.9 F (37.2 C)   Resp 18   SpO2 97%  Gen:   Awake, no distress   Resp:  Normal effort  MSK:   Moves extremities without difficulty  Other:  Bleeding from varicose vein  Medical Decision Making  Medically screening exam initiated at 10:09 PM.  Appropriate orders placed.  Theodis Aguas was informed that the remainder of the evaluation will be completed by another provider, this initial triage assessment does not replace that evaluation, and the importance of remaining in the ED until their evaluation is complete.  Active bleeding.  Sent to green zone for immediate management.   Montine Circle, PA-C 12/08/20 2212    Lajean Saver, MD 12/11/20 [phone]

## 2020-12-08 NOTE — ED Provider Notes (Deleted)
Emergency Medicine Provider Triage Evaluation Note  Parker Calderon , a 60 y.o. male  was evaluated in triage.  Pt complains of bleeding to the right foot.  He is on Eliquis for atrial fibrillation.  Patient is a poor historian.  Called a vascular doctor and that has not seen him since 2014 stating he nicked a varicosity with a knife.  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  States he needs to be admitted for IV antibiotics.   Review of Systems  Positive: Bleeding to the right foot Negative: Fever, chills   Physical Exam  There were no vitals taken for this visit. Gen:   Awake, no distress   Resp:  Normal effort  MSK:   Moves extremities without difficulty  Other:  Bleeding appears controlled, right foot is in bandage.  Medical Decision Making  Medically screening exam initiated at 9:57 PM.  Appropriate orders placed.  Theodis Aguas was informed that the remainder of the evaluation will be completed by another provider, this initial triage assessment does not replace that evaluation, and the importance of remaining in the ED until their evaluation is complete.     Sherrill Raring, PA-C 12/08/20 2158

## 2020-12-08 NOTE — ED Provider Notes (Signed)
[facility] EMERGENCY DEPARTMENT Provider Note   CSN: [id number] Arrival date & time: 12/08/20  2125     History Chief Complaint  Patient presents with   Leg Injury    Parker Calderon is a 60 y.o. male.  The history is provided by the patient.  He has history of hypertension, hyperlipidemia, atrial fibrillation anticoagulated on apixaban, heart failure with reduced ejection fraction, varicose veins and comes in because of persistent bleeding.  He had what vascular specialist had told him was a personalized veins on his right leg and he tried to cut them off with a razor blade.  He was unable to control the bleeding at home.   Past Medical History:  Diagnosis Date   Arthritis    Rhinitis   Atrial fibrillation (HCC)    CHF (congestive heart failure) (HCC)    Hyperlipidemia    Hypertension    Irritable bowel syndrome    Left thigh pain    Lateral, aching   Obesity, unspecified    Varicose veins     Patient Active Problem List   Diagnosis Date Noted   Persistent atrial fibrillation ([place]) 06/25/2019   Secondary hypercoagulable state ([place]) 06/25/2019   HFrEF (heart failure with reduced ejection fraction) ([place]) [id number]   Acute systolic CHF (congestive heart failure) (HCC)    Atrial fibrillation with rapid ventricular response ([place]) 03/22/2019   Volume overload 03/22/2019   Elevated blood pressure reading 03/22/2019   HLD (hyperlipidemia) 03/22/2019   Prolonged QT interval 03/22/2019   Varicose veins of lower extremities with other complications [id number]   Chronic venous insufficiency 01/08/2013    Past Surgical History:  Procedure Laterality Date   CARDIAC CATHETERIZATION     CARDIOVERSION N/A 03/26/2019   Procedure: CARDIOVERSION;  Surgeon: Josue Hector, MD;  Location: [place];  Service: Cardiovascular;  Laterality: N/A;   RIGHT/LEFT HEART CATH AND CORONARY ANGIOGRAPHY N/A 03/25/2019   Procedure: RIGHT/LEFT HEART CATH AND CORONARY  ANGIOGRAPHY;  Surgeon: Martinique, Peter M, MD;  Location: [place] CV LAB;  Service: Cardiovascular;  Laterality: N/A;   TEE WITHOUT CARDIOVERSION N/A 03/26/2019   Procedure: TRANSESOPHAGEAL ECHOCARDIOGRAM (TEE);  Surgeon: Josue Hector, MD;  Location: [facility] ENDOSCOPY;  Service: Cardiovascular;  Laterality: N/A;       Family History  Problem Relation Age of Onset   High blood pressure Mother    Kidney failure Father     Social History   Tobacco Use   Smoking status: Never   Smokeless tobacco: Never  Substance Use Topics   Alcohol use: No   Drug use: No    Home Medications Prior to Admission medications   Medication Sig Start Date End Date Taking? Authorizing Provider  apixaban (ELIQUIS) 5 MG TABS tablet Take 1 tablet (5 mg total) by mouth 2 (two) times daily. 08/29/20   O'NealCassie Freer, MD  atorvastatin (LIPITOR) 40 MG tablet Take 1 tablet (40 mg total) by mouth daily at 6 PM. 03/06/20   O'Neal, Cassie Freer, MD  furosemide (LASIX) 80 MG tablet Take 1 tablet (80 mg total) by mouth every other day. 03/06/20   O'Neal, Cassie Freer, MD  metoprolol succinate (TOPROL-XL) 100 MG 24 hr tablet Take 1 tablet (100 mg total) by mouth daily. Take with or immediately following a meal. 03/06/20   O'Neal, Cassie Freer, MD  Multiple Vitamin (MULTIVITAMIN) tablet Take 1 tablet by mouth daily.    [provider]  sacubitril-valsartan (ENTRESTO) 97-103 MG Take 1 tablet by mouth  2 (two) times daily. 09/01/20   O'NealCassie Freer, MD  spironolactone (ALDACTONE) 25 MG tablet Take 1 tablet (25 mg total) by mouth daily. 03/06/20   O'Neal, Cassie Freer, MD    Allergies    Patient has no known allergies.  Review of Systems   Review of Systems  All other systems reviewed and are negative.  Physical Exam Updated Vital Signs BP (!) 160/110 (BP Location: Left Arm)   Pulse 97   Temp 98.9 F (37.2 C)   Resp 18   SpO2 97%   Physical Exam Vitals and nursing note reviewed.  60  year old male, resting comfortably and in no acute distress. Vital signs are significant for elevated blood pressure. Oxygen saturation is 97%, which is normal. Head is normocephalic and atraumatic. PERRLA, EOMI. Oropharynx is clear. Neck is nontender and supple without adenopathy or JVD. Back is nontender and there is no CVA tenderness. Lungs are clear without rales, wheezes, or rhonchi. Chest is nontender. Heart has regular rate and rhythm without murmur. Abdomen is soft, flat, nontender without masses or hepatosplenomegaly and peristalsis is normoactive. Extremities have 1+ edema, full range of motion is present. Skin is warm and dry without rash.  There is a skin defect in the proximal right lower leg anterolaterally.  This has a steady ooze of blood. Neurologic: Mental status is normal, cranial nerves are intact, there are no motor or sensory deficits.  ED Results / Procedures / Treatments    Procedures Procedures  The affected area was cleaned with isopropyl alcohol and anesthetized with 2% lidocaine with epinephrine.  2 figure-of-eight sutures were placed with 3-0 Prolene with complete control of bleeding.  Dressing was applied.  Patient tolerated procedure well.  Medications Ordered in ED Medications  lidocaine-EPINEPHrine (XYLOCAINE W/EPI) 2 %-1:200000 (PF) injection 20 mL (20 mLs Infiltration Given by Other 12/08/20 2227)    ED Course  I have reviewed the triage vital signs and the nursing notes.  MDM Rules/Calculators/A&P                         Bleeding from cut varicose vein.  Old records reviewed confirming history of varicose veins and chronic anticoagulation.  Bleeding was controlled with 2 figure-of-eight sutures and dressing was applied.  He is discharged with instructions to have the stitches removed in 5-7 days.  Final Clinical Impression(s) / ED Diagnoses Final diagnoses:  Bleeding from varicose veins of lower extremity, right  Chronic anticoagulation  Elevated  blood pressure reading with diagnosis of hypertension    Rx / DC Orders ED Discharge Orders     None        Delora Fuel, MD [id number]

## 2020-12-08 NOTE — ED Triage Notes (Addendum)
Pt to ed with bleeding right thigh after laceration. Pressure being held for bleeding control. Hx vascular problem.   Pt roomed for stiches

## 2020-12-08 NOTE — Discharge Instructions (Addendum)
Please have the stitches removed in 5-7 days.

## 2020-12-09 ENCOUNTER — Other Ambulatory Visit: Payer: Self-pay | Admitting: Cardiovascular Disease

## 2020-12-15 ENCOUNTER — Ambulatory Visit (HOSPITAL_COMMUNITY)
Admission: EM | Admit: 2020-12-15 | Discharge: 2020-12-15 | Disposition: A | Discharge status: Home / Self Care | Payer: BC Managed Care – PPO

## 2020-12-15 ENCOUNTER — Other Ambulatory Visit: Payer: Self-pay

## 2020-12-15 MED ORDER — BACITRACIN ZINC 500 UNIT/GM EX OINT
TOPICAL_OINTMENT | CUTANEOUS | Status: AC
  Filled 2020-12-15: qty 28.35

## 2020-12-15 NOTE — ED Triage Notes (Signed)
Pt presents to have 2 figure eight sutures removed from right outer calf.

## 2020-12-24 ENCOUNTER — Other Ambulatory Visit: Payer: Self-pay | Admitting: Cardiovascular Disease

## 2020-12-25 ENCOUNTER — Other Ambulatory Visit: Payer: Self-pay | Admitting: Cardiovascular Disease

## 2020-12-29 ENCOUNTER — Telehealth: Payer: Self-pay | Admitting: Cardiovascular Disease

## 2020-12-29 ENCOUNTER — Other Ambulatory Visit: Payer: Self-pay | Admitting: Cardiovascular Disease

## 2020-12-29 MED ORDER — AMIODARONE HCL 200 MG PO TABS: 200.0000 mg | ORAL_TABLET | Freq: Every day | ORAL | 1 refills | Status: DC

## 2020-12-29 NOTE — Telephone Encounter (Signed)
*  STAT* If patient is at the pharmacy, call can be transferred to refill team.   1. Which medications need to be refilled? (please list name of each medication and dose if known) amiodarone (PACERONE) 200 MG tablet  2. Which pharmacy/location (including street and city if local pharmacy) is medication to be sent to? [id number]- [place], [place] - [address]  3. Do they need a 30 day or 90 day supply? 90 day supply   Pt is out of medication

## 2020-12-29 NOTE — Telephone Encounter (Signed)
Medication previously sent in.

## 2021-01-05 DIAGNOSIS — J40 Bronchitis, not specified as acute or chronic: Secondary | ICD-10-CM | POA: Diagnosis not present

## 2021-01-05 DIAGNOSIS — R051 Acute cough: Secondary | ICD-10-CM | POA: Diagnosis not present

## 2021-03-22 ENCOUNTER — Other Ambulatory Visit: Payer: Self-pay | Admitting: Cardiovascular Disease

## 2021-03-22 NOTE — Telephone Encounter (Signed)
Prescription refill request for Eliquis received. Indication:Afib Last office visit:4/22 KPV:VZSMO labs Age: 60 Weight:146.5 kg  Prescription refilled

## 2021-03-30 NOTE — Progress Notes (Signed)
Cardiology Office Note:   Date:  04/02/2021  NAME:  Parker Calderon    MRN: [id number] DOB:  April 07, 1961   PCP:  Shirline Frees, MD  Cardiologist:  Evalina Field, MD  Electrophysiologist:  None   Referring MD: Shirline Frees, MD   Chief Complaint  Patient presents with   Follow-up    History of Present Illness:   Parker Calderon is a 60 y.o. male with a hx of systolic heart failure with recovered ejection fraction, persistent atrial fibrillation status post cardioversion, morbid obesity, hypertension who presents for follow-up.  He reports he is well.  Denies any chest pain or trouble breathing.  EKG shows he is back in A. fib with RVR.  Heart rate 126.  He did not take his medications this morning.  He apparently has been taking medications loosely.  He does not feel his atrial fibrillation.  He has been on amiodarone for over 1 year.  We discussed cardiac ablation again.  I really think he should strongly consider this.  He seems to have failed amiodarone therapy.  His weight is stable but he has not lost any weight.  He reports she is doing the best he can.  Still working as a Presenter, broadcasting.  Denies any major symptoms in office.  A. fib has recurred.  Problem List: 1. 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 Atrial fibrillation  -s/p DCCV 03/2019 -recurrence 06/25/2019 -maintaining sinus rhythm off amiodarone  -CHADSVASC=2 4. Morbid obesity (BMI 44) 5. Hypertension   Past Medical History: Past Medical History:  Diagnosis Date   Arthritis    Rhinitis   Atrial fibrillation (HCC)    CHF (congestive heart failure) (HCC)    Hyperlipidemia    Hypertension    Irritable bowel syndrome    Left thigh pain    Lateral, aching   Obesity, unspecified    Varicose veins     Past Surgical History: Past Surgical History:  Procedure Laterality Date   CARDIAC CATHETERIZATION     CARDIOVERSION N/A 03/26/2019   Procedure:  CARDIOVERSION;  Surgeon: Josue Hector, MD;  Location: [place];  Service: Cardiovascular;  Laterality: N/A;   RIGHT/LEFT HEART CATH AND CORONARY ANGIOGRAPHY N/A 03/25/2019   Procedure: RIGHT/LEFT HEART CATH AND CORONARY ANGIOGRAPHY;  Surgeon: Martinique, Peter M, MD;  Location: [place] CV LAB;  Service: Cardiovascular;  Laterality: N/A;   TEE WITHOUT CARDIOVERSION N/A 03/26/2019   Procedure: TRANSESOPHAGEAL ECHOCARDIOGRAM (TEE);  Surgeon: Josue Hector, MD;  Location: [facility] ENDOSCOPY;  Service: Cardiovascular;  Laterality: N/A;    Current Medications: Current Meds  Medication Sig   amiodarone (PACERONE) 200 MG tablet Take 1 tablet (200 mg total) by mouth daily.   apixaban (ELIQUIS) 5 MG TABS tablet TAKE 1 TABLET BY MOUTH TWICE A DAY   atorvastatin (LIPITOR) 40 MG tablet TAKE 1 TABLET BY MOUTH EVERY DAY AT 6PM   furosemide (LASIX) 80 MG tablet TAKE 1 TABLET BY MOUTH EVERY DAY   Multiple Vitamin (MULTIVITAMIN) tablet Take 1 tablet by mouth daily.   sacubitril-valsartan (ENTRESTO) 97-103 MG Take 1 tablet by mouth 2 (two) times daily.   spironolactone (ALDACTONE) 25 MG tablet TAKE 1 TABLET BY MOUTH EVERY DAY   [DISCONTINUED] metoprolol succinate (TOPROL-XL) 100 MG 24 hr tablet TAKE 1 TABLET BY MOUTH EVERY DAY     Allergies:    Patient has no known allergies.   Social History: Social History   Socioeconomic History  Marital status: Single    Spouse name: Not on file   Number of children: Not on file   Years of education: Not on file   Highest education level: High school graduate  Occupational History   Not on file  Tobacco Use   Smoking status: Never   Smokeless tobacco: Never  Vaping Use   Vaping Use: Not on file  Substance and Sexual Activity   Alcohol use: No   Drug use: No   Sexual activity: Not on file  Other Topics Concern   Not on file  Social History Narrative   Not on file   Social Determinants of Health   Financial Resource Strain: Not on file  Food  Insecurity: Not on file  Transportation Needs: Not on file  Physical Activity: Not on file  Stress: Not on file  Social Connections: Not on file     Family History: The patient's family history includes High blood pressure in his mother; Kidney failure in his father.  ROS:   All other ROS reviewed and negative. Pertinent positives noted in the HPI.     EKGs/Labs/Other Studies Reviewed:   The following studies were personally reviewed by me today:  EKG:  EKG is ordered today.  The ekg ordered today demonstrates atrial fibrillation heart rate 126, no acute ischemic changes or evidence of infarction, and was personally reviewed by me.   TTE 11/29/2019  1. Since the last study on 03/22/2019 LVEF has improved from 20% to  55-60%.   2. Left ventricular ejection fraction, by estimation, is 55 to 60%. The  left ventricle has normal function. The left ventricle has no regional  wall motion abnormalities. There is moderate concentric left ventricular  hypertrophy. Left ventricular  diastolic parameters are consistent with Grade II diastolic dysfunction  (pseudonormalization).   3. Right ventricular systolic function is normal. The right ventricular  size is normal. There is normal pulmonary artery systolic pressure.   4. Left atrial size was mildly dilated.   5. The mitral valve is normal in structure. Mild mitral valve  regurgitation. No evidence of mitral stenosis.   6. The aortic valve is normal in structure. Aortic valve regurgitation is  not visualized. No aortic stenosis is present.   7. The inferior vena cava is normal in size with greater than 50%  respiratory variability, suggesting right atrial pressure of 3 mmHg.   Recent Labs: No results found for requested labs within last 8760 hours.   Recent Lipid Panel    Component Value Date/Time   CHOL 159 03/23/2019 1253   TRIG 105 03/23/2019 1253   HDL 32 (L) 03/23/2019 1253   CHOLHDL 5.0 03/23/2019 1253   VLDL 21 03/23/2019  1253   LDLCALC 106 (H) 03/23/2019 1253    Physical Exam:   VS:  BP 130/70 (BP Location: Left Arm, Patient Position: Sitting, Cuff Size: Large)   Pulse (!) 123   Resp 20   Ht 5\' 11"  (1.803 m)   Wt (!) 326 lb (147.9 kg)   SpO2 95%   BMI 45.47 kg/m    Wt Readings from Last 3 Encounters:  04/02/21 (!) 326 lb (147.9 kg)  09/14/20 (!) 323 lb (146.5 kg)  03/06/20 (!) 312 lb (141.5 kg)    General: Well nourished, well developed, in no acute distress Head: Atraumatic, normal size  Eyes: PEERLA, EOMI  Neck: Supple, no JVD Endocrine: No thryomegaly Cardiac: Normal S1, S2; RRR; irregular rhythm, no murmurs Lungs: Clear to auscultation bilaterally, no  wheezing, rhonchi or rales  Abd: Soft, nontender, no hepatomegaly  Ext: No edema, pulses 2+ Musculoskeletal: No deformities, BUE and BLE strength normal and equal Skin: Warm and dry, no rashes   Neuro: Alert and oriented to person, place, time, and situation, CNII-XII grossly intact, no focal deficits  Psych: Normal mood and affect   ASSESSMENT:   DEADRICK STIDD is a 60 y.o. male who presents for the following: 1. Chronic systolic heart failure (HCC)   2. Persistent atrial fibrillation ([place])   3. Essential hypertension   4. Obesity, morbid, BMI 40.0-49.9 ([place])     PLAN:   1. Chronic systolic heart failure ([place]) -Diagnosed with systolic heart failure in November 2020.  Normal coronary arteries on heart catheterization.  The etiology of his cardiomyopathy was thought to be arrhythmia related. -His EF did recover up to 55% in July 2021.  He has been maintained on metoprolol succinate, Entresto 97-103 mg twice daily, Aldactone 25 mg daily.  He is also been on Lasix 80 mg daily. -His atrial fibrillation has recurred.  We will increase his metoprolol succinate to 200 mg daily. -Given that his cardiomyopathy was due to arrhythmia I would like for him to be reevaluated by electrophysiology.  I believe atrial fibrillation ablation should strongly  be considered.  He seems to have failed amiodarone.  Given that his cardiomyopathy was secondary to A. fib I think this is of paramount importance.  This was explained to him today in office. -Overall he is euvolemic on exam.  We will continue with medical therapy for now. -He does not appear to be developing worsening congestive heart failure.  We will have him see EP and move forward from there.  He will see me back in 4 months.  2. Persistent atrial fibrillation (HCC) -CHA2DS2-VASc equals 2.  On Eliquis 5 mg twice daily. -He has failed amiodarone therapy.  Back in A. fib today.  Increase metoprolol succinate 200 mg daily. -He has been on amiodarone for over 2 years.  We will check full panel labs including BMP, A1c, TSH and CBC.  He also needs chest x-ray. -Given that he is back in A. fib I think he should strongly consider A. fib ablation. -He developed a cardiomyopathy from atrial fibrillation and I really believe rhythm control strategy is the best option for him.  However given his recurrence of A. fib on amiodarone this is alarming.  We will have him reevaluated by EP and move forward from there.  3. Essential hypertension -Well-controlled.  Continue current medications.  4. Obesity, morbid, BMI 40.0-49.9 ([place]) -Weight loss recommended.  Disposition: Return in about 4 months (around 07/31/2021).  Medication Adjustments/Labs and Tests Ordered: Current medicines are reviewed at length with the patient today.  Concerns regarding medicines are outlined above.  Orders Placed This Encounter  Procedures   DG Chest 2 View   Basic metabolic panel   CBC   TSH   Hemoglobin A1c   Ambulatory referral to Cardiac Electrophysiology   EKG 12-Lead    Meds ordered this encounter  Medications   metoprolol succinate (TOPROL-XL) 200 MG 24 hr tablet    Sig: Take 1 tablet (200 mg total) by mouth daily. Take with or immediately following a meal.    Dispense:  90 tablet    Refill:  2      Patient Instructions  Medication Instructions:  Increase Metoprolol to 200 mg daily.  The current medical regimen is effective;  continue present plan and medications.  *  If you need a refill on your cardiac medications before your next appointment, please call your pharmacy*   Lab Work: BMET, CBC, TSH, A1C today   If you have labs (blood work) drawn today and your tests are completely normal, you will receive your results only by: [place] (if you have MyChart) OR A paper copy in the mail If you have any lab test that is abnormal or we need to change your treatment, we will call you to review the results.   Testing/Procedures: Chest xray - Your physician has requested that you have a chest xray, is a fast and painless imaging test that uses certain electromagnetic waves to create pictures of the structures in and around your chest. This test can help diagnose and monitor conditions such as pneumonia and other lung issues his will be done at [facility], [place]. If you should need to call them their phone number is [phone].    Follow-Up: At [facility], you and your health needs are our priority.  As part of our continuing mission to provide you with exceptional heart care, we have created designated Provider Care Teams.  These Care Teams include your primary Cardiologist (physician) and Advanced Practice Providers (APPs -  Physician Assistants and Nurse Practitioners) who all work together to provide you with the care you need, when you need it.  We recommend signing up for the patient portal called "MyChart".  Sign up information is provided on this After Visit Summary.  MyChart is used to connect with patients for Virtual Visits (Telemedicine).  Patients are able to view lab/test results, encounter notes, upcoming appointments, etc.  Non-urgent messages can be sent to your provider as well.   To learn more about what you can do with  MyChart, go to [email or web address].    Your next appointment:   4 month(s)  The format for your next appointment:   In Person  Provider:   Evalina Field, MD     Other Instructions Referral to [facility]- they will call you to get set up for an appointment.    Time Spent with Patient: I have spent a total of 35 minutes with patient reviewing hospital notes, telemetry, EKGs, labs and examining the patient as well as establishing an assessment and plan that was discussed with the patient.  > 50% of time was spent in direct patient care.  Signed, Addison Naegeli. Audie Box, MD, [place]  [address], [place], [place] [address] [phone]  04/02/2021 8:27 AM

## 2021-04-01 DIAGNOSIS — I10 Essential (primary) hypertension: Secondary | ICD-10-CM | POA: Diagnosis not present

## 2021-04-01 DIAGNOSIS — J069 Acute upper respiratory infection, unspecified: Secondary | ICD-10-CM | POA: Diagnosis not present

## 2021-04-02 ENCOUNTER — Ambulatory Visit (INDEPENDENT_AMBULATORY_CARE_PROVIDER_SITE_OTHER): Payer: Self-pay | Admitting: Cardiovascular Disease

## 2021-04-02 ENCOUNTER — Encounter: Payer: Self-pay | Admitting: Cardiovascular Disease

## 2021-04-02 ENCOUNTER — Other Ambulatory Visit: Payer: Self-pay

## 2021-04-02 VITALS — BP 130/70 | HR 123 | Resp 20 | Ht 71.0 in | Wt 326.0 lb

## 2021-04-02 DIAGNOSIS — I4819 Other persistent atrial fibrillation: Secondary | ICD-10-CM

## 2021-04-02 DIAGNOSIS — I5022 Chronic systolic (congestive) heart failure: Secondary | ICD-10-CM

## 2021-04-02 DIAGNOSIS — I10 Essential (primary) hypertension: Secondary | ICD-10-CM

## 2021-04-02 LAB — BASIC METABOLIC PANEL
BUN/Creatinine Ratio: 9 — ABNORMAL LOW (ref 10–24)
BUN: 10 mg/dL (ref 8–27)
CO2: 23 mmol/L (ref 20–29)
Calcium: 8.7 mg/dL (ref 8.6–10.2)
Chloride: 98 mmol/L (ref 96–106)
Creatinine, Ser: 1.17 mg/dL (ref 0.76–1.27)
Glucose: 132 mg/dL — ABNORMAL HIGH (ref 70–99)
Potassium: 3.4 mmol/L — ABNORMAL LOW (ref 3.5–5.2)
Sodium: 138 mmol/L (ref 134–144)
eGFR: 71 mL/min/{1.73_m2} (ref >59)

## 2021-04-02 LAB — CBC
Hematocrit: 44.3 % (ref 37.5–51.0)
Hemoglobin: 15.4 g/dL (ref 13.0–17.7)
MCH: 31.3 pg (ref 26.6–33.0)
MCHC: 34.8 g/dL (ref 31.5–35.7)
MCV: 90 fL (ref 79–97)
Platelets: 250 10*3/uL (ref 150–450)
RBC: 4.92 x10E6/uL (ref 4.14–5.80)
RDW: 12.6 % (ref 11.6–15.4)
WBC: 9.6 10*3/uL (ref 3.4–10.8)

## 2021-04-02 LAB — HEMOGLOBIN A1C
Est. average glucose Bld gHb Est-mCnc: 126 mg/dL
Hgb A1c MFr Bld: 6 % — ABNORMAL HIGH (ref 4.8–5.6)

## 2021-04-02 LAB — TSH: TSH: 0.729 u[IU]/mL (ref 0.450–4.500)

## 2021-04-02 MED ORDER — METOPROLOL SUCCINATE ER 200 MG PO TB24: 200.0000 mg | ORAL_TABLET | Freq: Every day | ORAL | 2 refills | Status: DC

## 2021-04-02 NOTE — Addendum Note (Signed)
Addended by: Caprice Beaver T on: 04/02/2021 09:40 AM   Modules accepted: Orders

## 2021-04-02 NOTE — Patient Instructions (Signed)
Medication Instructions:  Increase Metoprolol to 200 mg daily.  The current medical regimen is effective;  continue present plan and medications.  *If you need a refill on your cardiac medications before your next appointment, please call your pharmacy*   Lab Work: BMET, CBC, TSH, A1C today   If you have labs (blood work) drawn today and your tests are completely normal, you will receive your results only by: [place] (if you have MyChart) OR A paper copy in the mail If you have any lab test that is abnormal or we need to change your treatment, we will call you to review the results.   Testing/Procedures: Chest xray - Your physician has requested that you have a chest xray, is a fast and painless imaging test that uses certain electromagnetic waves to create pictures of the structures in and around your chest. This test can help diagnose and monitor conditions such as pneumonia and other lung issues his will be done at [facility], [place]. If you should need to call them their phone number is [phone].    Follow-Up: At [facility], you and your health needs are our priority.  As part of our continuing mission to provide you with exceptional heart care, we have created designated Provider Care Teams.  These Care Teams include your primary Cardiologist (physician) and Advanced Practice Providers (APPs -  Physician Assistants and Nurse Practitioners) who all work together to provide you with the care you need, when you need it.  We recommend signing up for the patient portal called "MyChart".  Sign up information is provided on this After Visit Summary.  MyChart is used to connect with patients for Virtual Visits (Telemedicine).  Patients are able to view lab/test results, encounter notes, upcoming appointments, etc.  Non-urgent messages can be sent to your provider as well.   To learn more about what you can do with MyChart, go to  [email or web address].    Your next appointment:   4 month(s)  The format for your next appointment:   In Person  Provider:   Evalina Field, MD     Other Instructions Referral to [facility]- they will call you to get set up for an appointment.

## 2021-04-07 ENCOUNTER — Other Ambulatory Visit: Payer: Self-pay | Admitting: Cardiovascular Disease

## 2021-04-17 ENCOUNTER — Encounter: Payer: Self-pay | Admitting: Cardiology

## 2021-05-26 DIAGNOSIS — Z713 Dietary counseling and surveillance: Secondary | ICD-10-CM | POA: Diagnosis not present

## 2021-05-26 DIAGNOSIS — I10 Essential (primary) hypertension: Secondary | ICD-10-CM | POA: Diagnosis not present

## 2021-05-26 DIAGNOSIS — Z131 Encounter for screening for diabetes mellitus: Secondary | ICD-10-CM | POA: Diagnosis not present

## 2021-05-26 DIAGNOSIS — Z136 Encounter for screening for cardiovascular disorders: Secondary | ICD-10-CM | POA: Diagnosis not present

## 2021-05-26 DIAGNOSIS — Z13220 Encounter for screening for lipoid disorders: Secondary | ICD-10-CM | POA: Diagnosis not present

## 2021-06-18 ENCOUNTER — Encounter: Payer: Self-pay | Admitting: Cardiovascular Disease

## 2021-06-18 NOTE — Telephone Encounter (Signed)
error 

## 2021-06-28 ENCOUNTER — Other Ambulatory Visit: Payer: Self-pay | Admitting: Cardiovascular Disease

## 2021-07-16 ENCOUNTER — Other Ambulatory Visit: Payer: Self-pay

## 2021-07-16 ENCOUNTER — Telehealth (HOSPITAL_COMMUNITY): Payer: Self-pay | Admitting: Physician Assistant

## 2021-07-16 ENCOUNTER — Other Ambulatory Visit: Payer: Self-pay | Admitting: *Deleted

## 2021-07-16 ENCOUNTER — Ambulatory Visit (HOSPITAL_COMMUNITY): Payer: BC Managed Care – PPO | Attending: Cardiology

## 2021-07-16 DIAGNOSIS — I4819 Other persistent atrial fibrillation: Secondary | ICD-10-CM | POA: Diagnosis not present

## 2021-07-16 DIAGNOSIS — I5022 Chronic systolic (congestive) heart failure: Secondary | ICD-10-CM | POA: Diagnosis not present

## 2021-07-16 LAB — ECHOCARDIOGRAM COMPLETE
Area-P 1/2: 5.06 cm2
S' Lateral: 5.3 cm

## 2021-07-16 NOTE — Telephone Encounter (Signed)
Called and left message for patient to call back to schedule appt with [facility] per Referral from Dr. Quentin Ore.

## 2021-07-18 ENCOUNTER — Other Ambulatory Visit: Payer: Self-pay

## 2021-07-18 NOTE — Telephone Encounter (Signed)
Called and left 2nd message asking pt to call back to [facility] to schedule appt. ?

## 2021-07-24 NOTE — Telephone Encounter (Signed)
Called and spoke with patient.  He is agreeable to appt 08/06/21 but states he cannot come any sooner due to his work schedule. ?

## 2021-08-03 ENCOUNTER — Ambulatory Visit: Payer: Self-pay | Admitting: Cardiovascular Disease

## 2021-08-06 ENCOUNTER — Ambulatory Visit (HOSPITAL_COMMUNITY)
Admission: RE | Admit: 2021-08-06 | Discharge: 2021-08-06 | Disposition: A | Discharge status: Home / Self Care | Payer: BC Managed Care – PPO | Source: Ambulatory Visit | Attending: Physician Assistant | Admitting: Physician Assistant

## 2021-08-06 ENCOUNTER — Other Ambulatory Visit: Payer: Self-pay

## 2021-08-06 VITALS — BP 164/104 | HR 118 | Ht 71.0 in | Wt 327.0 lb

## 2021-08-06 DIAGNOSIS — E669 Obesity, unspecified: Secondary | ICD-10-CM | POA: Diagnosis not present

## 2021-08-06 DIAGNOSIS — I110 Hypertensive heart disease with heart failure: Secondary | ICD-10-CM | POA: Diagnosis not present

## 2021-08-06 DIAGNOSIS — Z7901 Long term (current) use of anticoagulants: Secondary | ICD-10-CM | POA: Diagnosis not present

## 2021-08-06 DIAGNOSIS — Z79899 Other long term (current) drug therapy: Secondary | ICD-10-CM | POA: Diagnosis not present

## 2021-08-06 DIAGNOSIS — I4819 Other persistent atrial fibrillation: Secondary | ICD-10-CM

## 2021-08-06 DIAGNOSIS — I5022 Chronic systolic (congestive) heart failure: Secondary | ICD-10-CM | POA: Diagnosis not present

## 2021-08-06 DIAGNOSIS — D6869 Other thrombophilia: Secondary | ICD-10-CM | POA: Diagnosis not present

## 2021-08-06 DIAGNOSIS — Z6841 Body Mass Index (BMI) 40.0 and over, adult: Secondary | ICD-10-CM | POA: Insufficient documentation

## 2021-08-06 NOTE — Progress Notes (Signed)
? ? ?Primary Care Physician: Shirline Frees, MD ?Primary Cardiologist: Dr Audie Box ?Primary Electrophysiologist: Dr Curt Bears  ?Referring Physician: Almyra Deforest PA ? ? ?Parker Calderon is a 61 y.o. male with a history of systolic HF (GY69-48%, normal LHC), persistent Afib s/p TEE/DCCV, morbidy obesity (BMI 46), HTN who presents for follow up in the [facility]. The patient was initially diagnosed with atrial fibrillation after being admitted with acute systolic CHF on 54/6/27 and found to be in afib with RVR. His systolic function was severely reduced <20%. LHC showed normal coronary arteries. Patient is on Eliquis for a CHADS2VASC score of 2. He underwent TEE/DCCV on 03/26/19 and was weaned off amiodarone. He denies any significant alcohol use or snoring. He was found to be back in afib 06/2019 and his amiodarone was resumed. He converted chemically.  ? ?On follow up today, patient had an echocardiogram which showed preserved EF but he was back in rapid afib. He states he is asymptomatic with his arrhythmia. He admits that he is not compliant with any of his medications. On only takes his medications on the weekends usually. When he does take Eliquis, he only takes it once daily. He has taken none of his medication this morning.  ? ?Today, he denies symptoms of palpitations, chest pain, shortness of breath, orthopnea, PND, lower extremity edema, dizziness, presyncope, syncope, snoring, daytime somnolence, bleeding, or neurologic sequela. The patient is tolerating medications without difficulties and is otherwise without complaint today.  ? ? ?Atrial Fibrillation Risk Factors: ? ?he does not have symptoms or diagnosis of sleep apnea. ?he does not have a history of rheumatic fever. ?he does not have a history of alcohol use. ? ? ?he has a BMI of Body mass index is 45.61 kg/m?Marland KitchenMarland Kitchen ?Filed Weights  ? 08/06/21 0829  ?Weight: (!) 148.3 kg  ? ? ? ?Family History  ?Problem Relation Age of Onset  ? High blood  pressure Mother   ? Kidney failure Father   ? ? ? ?Atrial Fibrillation Management history: ? ?Previous antiarrhythmic drugs: amiodarone ?Previous cardioversions: 03/26/19 ?Previous ablations: none ?CHADS2VASC score: 2 ?Anticoagulation history: Eliquis ? ? ?Past Medical History:  ?Diagnosis Date  ? Arthritis   ? Rhinitis  ? Atrial fibrillation ([place])   ? CHF (congestive heart failure) ([place])   ? Hyperlipidemia   ? Hypertension   ? Irritable bowel syndrome   ? Left thigh pain   ? Lateral, aching  ? Obesity, unspecified   ? Varicose veins   ? ?Past Surgical History:  ?Procedure Laterality Date  ? CARDIAC CATHETERIZATION    ? CARDIOVERSION N/A 03/26/2019  ? Procedure: CARDIOVERSION;  Surgeon: Josue Hector, MD;  Location: [facility] ENDOSCOPY;  Service: Cardiovascular;  Laterality: N/A;  ? RIGHT/LEFT HEART CATH AND CORONARY ANGIOGRAPHY N/A 03/25/2019  ? Procedure: RIGHT/LEFT HEART CATH AND CORONARY ANGIOGRAPHY;  Surgeon: Martinique, Peter M, MD;  Location: [place] [facility] CV LAB;  Service: Cardiovascular;  Laterality: N/A;  ? TEE WITHOUT CARDIOVERSION N/A 03/26/2019  ? Procedure: TRANSESOPHAGEAL ECHOCARDIOGRAM (TEE);  Surgeon: Josue Hector, MD;  Location[facility] ENDOSCOPY;  Service: Cardiovascular;  Laterality: N/A;  ? ? ?Current Outpatient Medications  ?Medication Sig Dispense Refill  ? amiodarone (PACERONE) 200 MG tablet TAKE 1 TABLET BY MOUTH EVERY DAY 90 tablet 1  ? apixaban (ELIQUIS) 5 MG TABS tablet TAKE 1 TABLET BY MOUTH TWICE A DAY 60 tablet 0  ? atorvastatin (LIPITOR) 40 MG tablet TAKE 1 TABLET BY MOUTH EVERY DAY AT 6PM 90 tablet  3  ? furosemide (LASIX) 80 MG tablet TAKE 1 TABLET BY MOUTH EVERY DAY 90 tablet 3  ? metoprolol succinate (TOPROL-XL) 200 MG 24 hr tablet Take 1 tablet (200 mg total) by mouth daily. Take with or immediately following a meal. 90 tablet 2  ? Multiple Vitamin (MULTIVITAMIN) tablet Take 1 tablet by mouth daily.    ? sacubitril-valsartan (ENTRESTO) 97-103 MG Take 1 tablet by mouth 2 (two) times daily. 30  tablet 0  ? spironolactone (ALDACTONE) 25 MG tablet TAKE 1 TABLET BY MOUTH EVERY DAY 90 tablet 3  ? ?No current facility-administered medications for this encounter.  ? ? ?No Known Allergies ? ?Social History  ? ?Socioeconomic History  ? Marital status: Single  ?  Spouse name: Not on file  ? Number of children: Not on file  ? Years of education: Not on file  ? Highest education level: High school graduate  ?Occupational History  ? Not on file  ?Tobacco Use  ? Smoking status: Never  ? Smokeless tobacco: Never  ?Vaping Use  ? Vaping Use: Not on file  ?Substance and Sexual Activity  ? Alcohol use: No  ? Drug use: No  ? Sexual activity: Not on file  ?Other Topics Concern  ? Not on file  ?Social History Narrative  ? Not on file  ? ?Social Determinants of Health  ? ?Financial Resource Strain: Not on file  ?Food Insecurity: Not on file  ?Transportation Needs: Not on file  ?Physical Activity: Not on file  ?Stress: Not on file  ?Social Connections: Not on file  ?Intimate Partner Violence: Not on file  ? ? ? ?ROS- All systems are reviewed and negative except as per the HPI above. ? ?Physical Exam: ?Vitals:  ? 08/06/21 0829  ?BP: (!) 164/104  ?Pulse: (!) 118  ?Weight: (!) 148.3 kg  ?Height: '5\' 11"'$  (1.803 m)  ? ? ?GEN- The patient is a well appearing obese male, alert and oriented x 3 today.   ?HEENT-head normocephalic, atraumatic, sclera clear, conjunctiva pink, hearing intact, trachea midline. ?Lungs- Clear to ausculation bilaterally, normal work of breathing ?Heart- irregular rate and rhythm, tachycardia, no murmurs, rubs or gallops  ?GI- soft, NT, ND, + BS ?Extremities- no clubbing, cyanosis, or edema ?MS- no significant deformity or atrophy ?Skin- no rash or lesion ?Psych- euthymic mood, full affect ?Neuro- strength and sensation are intact ? ? ?Wt Readings from Last 3 Encounters:  ?08/06/21 (!) 148.3 kg  ?04/02/21 (!) 147.9 kg  ?09/14/20 (!) 146.5 kg  ? ? ?EKG today demonstrates  ?Afib with RVR ?Vent. rate 118 BPM ?PR  interval * ms ?QRS duration 78 ms ?QT/QTcB 342/479 ms ? ?Echo 07/16/21 demonstrated  ? 1. Pt in atrial fibrillation with HR 120-140 at time of study.  ? 2. Left ventricular ejection fraction, by estimation, is 55 to 60%. The  ?left ventricle has normal function. The left ventricle has no regional  ?wall motion abnormalities. The left ventricular internal cavity size was  ?mildly dilated. Left ventricular diastolic function could not be evaluated.  ? 3. Right ventricular systolic function is normal. The right ventricular  ?size is normal.  ? 4. Left atrial size was mildly dilated.  ? 5. The mitral valve is normal in structure. Trivial mitral valve  ?regurgitation. No evidence of mitral stenosis.  ? 6. The aortic valve is normal in structure. Aortic valve regurgitation is  ?trivial. No aortic stenosis is present.  ? 7. The inferior vena cava is normal in size with  greater than 50%  ?respiratory variability, suggesting right atrial pressure of 3 mmHg.  ? ?Epic records are reviewed at length today ? ?CHA2DS2-VASc Score = 2  ?The patient's score is based upon: ?CHF History: 1 ?HTN History: 1 ?Diabetes History: 0 ?Stroke History: 0 ?Vascular Disease History: 0 ?Age Score: 0 ?Gender Score: 0 ?    ? ? ? ? ?ASSESSMENT AND PLAN: ?1. Persistent Atrial Fibrillation (ICD10:  I48.19) ?The patient's CHA2DS2-VASc score is 2, indicating a 2.2% annual risk of stroke.   ?We discussed the importance of taking his medication as prescribed to prevent stroke, maintain SR, and keep his heart pumping function strong. Will not arrange DCCV right now as he has not been compliant with anticoagulation.  ?Will have him take his scheduled medications and reevaluate in 2 weeks. If still in afib, will increase amiodarone and plan for DCCV.  ?He has an appointment to discuss ablation with Dr Curt Bears although admittedly his weight and LA size are a disincentive.  ?Continue Eliquis 5 mg BID ?Continue Toprol 200 mg daily ? ?2. Secondary Hypercoagulable  State (ICD10:  D68.69) ?The patient is at significant risk for stroke/thromboembolism based upon his CHA2DS2-VASc Score of 2.  Continue Apixaban (Eliquis).  ? ?3. Obesity ?Body mass index is 45.61 kg/m?. ?Nicoletta Dress

## 2021-08-20 ENCOUNTER — Encounter (HOSPITAL_COMMUNITY): Payer: Self-pay | Admitting: Physician Assistant

## 2021-08-20 ENCOUNTER — Ambulatory Visit (HOSPITAL_COMMUNITY)
Admission: RE | Admit: 2021-08-20 | Discharge: 2021-08-20 | Disposition: A | Discharge status: Home / Self Care | Payer: BC Managed Care – PPO | Source: Ambulatory Visit | Attending: Physician Assistant | Admitting: Physician Assistant

## 2021-08-20 VITALS — BP 140/92 | HR 113 | Ht 71.0 in | Wt 333.6 lb

## 2021-08-20 DIAGNOSIS — Z79899 Other long term (current) drug therapy: Secondary | ICD-10-CM | POA: Diagnosis not present

## 2021-08-20 DIAGNOSIS — Z6841 Body Mass Index (BMI) 40.0 and over, adult: Secondary | ICD-10-CM | POA: Insufficient documentation

## 2021-08-20 DIAGNOSIS — I5022 Chronic systolic (congestive) heart failure: Secondary | ICD-10-CM | POA: Insufficient documentation

## 2021-08-20 DIAGNOSIS — I4819 Other persistent atrial fibrillation: Secondary | ICD-10-CM | POA: Insufficient documentation

## 2021-08-20 DIAGNOSIS — I110 Hypertensive heart disease with heart failure: Secondary | ICD-10-CM | POA: Diagnosis not present

## 2021-08-20 DIAGNOSIS — E669 Obesity, unspecified: Secondary | ICD-10-CM | POA: Diagnosis not present

## 2021-08-20 DIAGNOSIS — D6869 Other thrombophilia: Secondary | ICD-10-CM | POA: Diagnosis not present

## 2021-08-20 DIAGNOSIS — Z7901 Long term (current) use of anticoagulants: Secondary | ICD-10-CM | POA: Diagnosis not present

## 2021-08-20 DIAGNOSIS — Z7182 Exercise counseling: Secondary | ICD-10-CM | POA: Diagnosis not present

## 2021-08-20 MED ORDER — APIXABAN 5 MG PO TABS: 5.0000 mg | ORAL_TABLET | Freq: Two times a day (BID) | ORAL | 6 refills | Status: DC

## 2021-08-20 NOTE — Progress Notes (Signed)
? ? ?Primary Care Physician: Shirline Frees, MD ?Primary Cardiologist: Dr Audie Box ?Primary Electrophysiologist: Dr Curt Bears  ?Referring Physician: Almyra Deforest PA ? ? ?Parker Calderon is a 61 y.o. male with a history of systolic HF (VW97-94%, normal LHC), persistent Afib s/p TEE/DCCV, morbidy obesity (BMI 46), HTN who presents for follow up in the [facility]. The patient was initially diagnosed with atrial fibrillation after being admitted with acute systolic CHF on 80/1/65 and found to be in afib with RVR. His systolic function was severely reduced <20%. LHC showed normal coronary arteries. Patient is on Eliquis for a CHADS2VASC score of 2. He underwent TEE/DCCV on 03/26/19 and was weaned off amiodarone. He denies any significant alcohol use or snoring. He was found to be back in afib 06/2019 and his amiodarone was resumed. He converted chemically.  ? ?On follow up today, patient reports that he feels well today. He checks his BP and heart rates at home which have been 70s-low 100s bpm. He has done better taking his medications but he admits he still frequently misses Eliquis.  ? ?Today, he denies symptoms of palpitations, chest pain, shortness of breath, orthopnea, PND, lower extremity edema, dizziness, presyncope, syncope, snoring, daytime somnolence, bleeding, or neurologic sequela. The patient is tolerating medications without difficulties and is otherwise without complaint today.  ? ? ?Atrial Fibrillation Risk Factors: ? ?he does not have symptoms or diagnosis of sleep apnea. ?he does not have a history of rheumatic fever. ?he does not have a history of alcohol use. ? ? ?he has a BMI of Body mass index is 46.53 kg/m?Marland KitchenMarland Kitchen ?Filed Weights  ? 08/20/21 0833  ?Weight: (!) 151.3 kg  ? ? ? ? ?Family History  ?Problem Relation Age of Onset  ? High blood pressure Mother   ? Kidney failure Father   ? ? ? ?Atrial Fibrillation Management history: ? ?Previous antiarrhythmic drugs: amiodarone ?Previous  cardioversions: 03/26/19 ?Previous ablations: none ?CHADS2VASC score: 2 ?Anticoagulation history: Eliquis ? ? ?Past Medical History:  ?Diagnosis Date  ? Arthritis   ? Rhinitis  ? Atrial fibrillation ([place])   ? CHF (congestive heart failure) ([place])   ? Hyperlipidemia   ? Hypertension   ? Irritable bowel syndrome   ? Left thigh pain   ? Lateral, aching  ? Obesity, unspecified   ? Varicose veins   ? ?Past Surgical History:  ?Procedure Laterality Date  ? CARDIAC CATHETERIZATION    ? CARDIOVERSION N/A 03/26/2019  ? Procedure: CARDIOVERSION;  Surgeon: Josue Hector, MD;  Location: [facility] ENDOSCOPY;  Service: Cardiovascular;  Laterality: N/A;  ? RIGHT/LEFT HEART CATH AND CORONARY ANGIOGRAPHY N/A 03/25/2019  ? Procedure: RIGHT/LEFT HEART CATH AND CORONARY ANGIOGRAPHY;  Surgeon: Martinique, Peter M, MD;  Location: [place] CV LAB;  Service: Cardiovascular;  Laterality: N/A;  ? TEE WITHOUT CARDIOVERSION N/A 03/26/2019  ? Procedure: TRANSESOPHAGEAL ECHOCARDIOGRAM (TEE);  Surgeon: Josue Hector, MD;  Location: [facility] ENDOSCOPY;  Service: Cardiovascular;  Laterality: N/A;  ? ? ?Current Outpatient Medications  ?Medication Sig Dispense Refill  ? amiodarone (PACERONE) 200 MG tablet TAKE 1 TABLET BY MOUTH EVERY DAY 90 tablet 1  ? apixaban (ELIQUIS) 5 MG TABS tablet TAKE 1 TABLET BY MOUTH TWICE A DAY 60 tablet 0  ? atorvastatin (LIPITOR) 40 MG tablet TAKE 1 TABLET BY MOUTH EVERY DAY AT 6PM 90 tablet 3  ? furosemide (LASIX) 80 MG tablet TAKE 1 TABLET BY MOUTH EVERY DAY 90 tablet 3  ? metoprolol succinate (TOPROL-XL) 200 MG 24  hr tablet Take 1 tablet (200 mg total) by mouth daily. Take with or immediately following a meal. 90 tablet 2  ? Multiple Vitamin (MULTIVITAMIN) tablet Take 1 tablet by mouth daily.    ? sacubitril-valsartan (ENTRESTO) 97-103 MG Take 1 tablet by mouth 2 (two) times daily. 30 tablet 0  ? spironolactone (ALDACTONE) 25 MG tablet TAKE 1 TABLET BY MOUTH EVERY DAY 90 tablet 3  ? ?No current facility-administered medications for  this encounter.  ? ? ?No Known Allergies ? ?Social History  ? ?Socioeconomic History  ? Marital status: Single  ?  Spouse name: Not on file  ? Number of children: Not on file  ? Years of education: Not on file  ? Highest education level: High school graduate  ?Occupational History  ? Not on file  ?Tobacco Use  ? Smoking status: Never  ? Smokeless tobacco: Never  ? Tobacco comments:  ?  Never smoke 08/20/21  ?Vaping Use  ? Vaping Use: Not on file  ?Substance and Sexual Activity  ? Alcohol use: No  ? Drug use: No  ? Sexual activity: Not on file  ?Other Topics Concern  ? Not on file  ?Social History Narrative  ? Not on file  ? ?Social Determinants of Health  ? ?Financial Resource Strain: Not on file  ?Food Insecurity: Not on file  ?Transportation Needs: Not on file  ?Physical Activity: Not on file  ?Stress: Not on file  ?Social Connections: Not on file  ?Intimate Partner Violence: Not on file  ? ? ? ?ROS- All systems are reviewed and negative except as per the HPI above. ? ?Physical Exam: ?Vitals:  ? 08/20/21 0833  ?BP: (!) 140/92  ?Pulse: (!) 113  ?Weight: (!) 151.3 kg  ?Height: '5\' 11"'$  (1.803 m)  ? ? ?GEN- The patient is a well appearing obese male, alert and oriented x 3 today.   ?HEENT-head normocephalic, atraumatic, sclera clear, conjunctiva pink, hearing intact, trachea midline. ?Lungs- Clear to ausculation bilaterally, normal work of breathing ?Heart- irregular rate and rhythm, no murmurs, rubs or gallops  ?GI- soft, NT, ND, + BS ?Extremities- no clubbing, cyanosis, or edema ?MS- no significant deformity or atrophy ?Skin- no rash or lesion ?Psych- euthymic mood, full affect ?Neuro- strength and sensation are intact ? ? ?Wt Readings from Last 3 Encounters:  ?08/20/21 (!) 151.3 kg  ?08/06/21 (!) 148.3 kg  ?04/02/21 (!) 147.9 kg  ? ? ?EKG today demonstrates  ?Afib ?Vent. rate 113 BPM ?PR interval * ms ?QRS duration 84 ms ?QT/QTcB 360/493 ms ? ?Echo 07/16/21 demonstrated  ? 1. Pt in atrial fibrillation with HR 120-140  at time of study.  ? 2. Left ventricular ejection fraction, by estimation, is 55 to 60%. The  ?left ventricle has normal function. The left ventricle has no regional  ?wall motion abnormalities. The left ventricular internal cavity size was  ?mildly dilated. Left ventricular diastolic function could not be evaluated.  ? 3. Right ventricular systolic function is normal. The right ventricular  ?size is normal.  ? 4. Left atrial size was mildly dilated.  ? 5. The mitral valve is normal in structure. Trivial mitral valve  ?regurgitation. No evidence of mitral stenosis.  ? 6. The aortic valve is normal in structure. Aortic valve regurgitation is  ?trivial. No aortic stenosis is present.  ? 7. The inferior vena cava is normal in size with greater than 50%  ?respiratory variability, suggesting right atrial pressure of 3 mmHg.  ? ?Epic records are reviewed  at length today ? ?CHA2DS2-VASc Score = 2  ?The patient's score is based upon: ?CHF History: 1 ?HTN History: 1 ?Diabetes History: 0 ?Stroke History: 0 ?Vascular Disease History: 0 ?Age Score: 0 ?Gender Score: 0 ?    ? ? ?ASSESSMENT AND PLAN: ?1. Persistent Atrial Fibrillation (ICD10:  I48.19) ?The patient's CHA2DS2-VASc score is 2, indicating a 2.2% annual risk of stroke.   ?Patient remains in afib with elevated heart rates, better at home.  ?We again discussed the importance of taking his medication as prescribed, especially Eliquis. Offered to arrange DCCV today. Patient will call back with availability. Instructed patient to call clinic if he misses a dose of anticoagulation. ?He has an appointment to discuss ablation with Dr Curt Bears although admittedly his weight and LA size are a disincentive. ?Continue Eliquis 5 mg BID ?Continue Toprol 200 mg daily ? ?2. Secondary Hypercoagulable State (ICD10:  D68.69) ?The patient is at significant risk for stroke/thromboembolism based upon his CHA2DS2-VASc Score of 2.  Continue Apixaban (Eliquis).  ? ?3. Obesity ?Body mass index  is 46.53 kg/m?. ?Lifestyle modification was discussed and encouraged including regular physical activity and weight reduction. ? ?4. Chronic systolic CHF ?Suspected tachycardia mediated.  ?EF 55-60% on las

## 2021-08-22 ENCOUNTER — Telehealth: Payer: Self-pay | Admitting: Cardiovascular Disease

## 2021-08-22 NOTE — Telephone Encounter (Signed)
Patient wanted to know if Dr. Audie Box got his faxed application for his Eliquis renewal. He said he faxed his portion to Dr. Audie Box 08/13/21.  ?He was not sure what to do next ?

## 2021-08-26 IMAGING — CR DG CHEST 2V
2 series · 2 of 2 positions shown · non-contrast
Comparison: None.

CLINICAL DATA: Feet swelling

EXAM:
CHEST - 2 VIEW

[w chest pa]
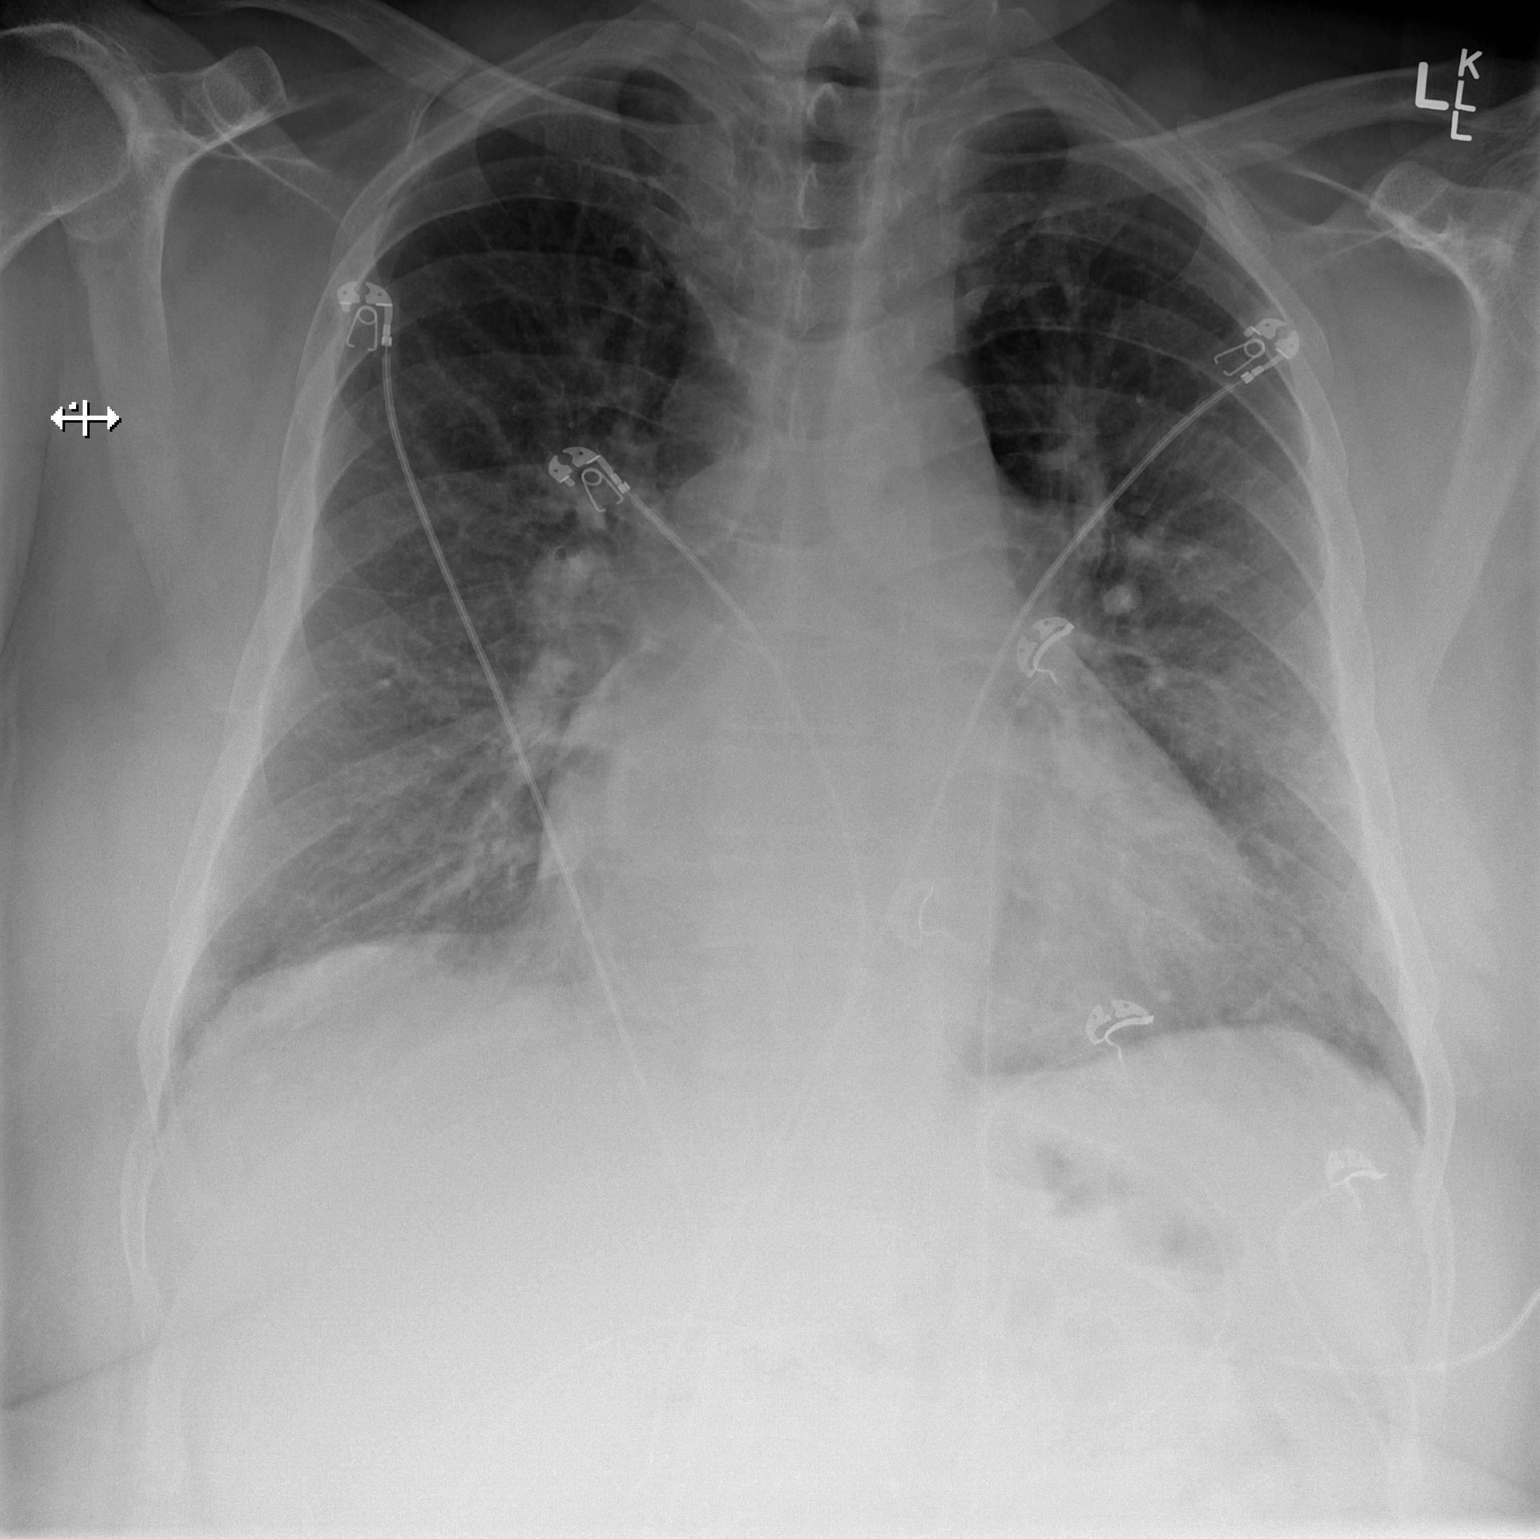

[w chest lat]
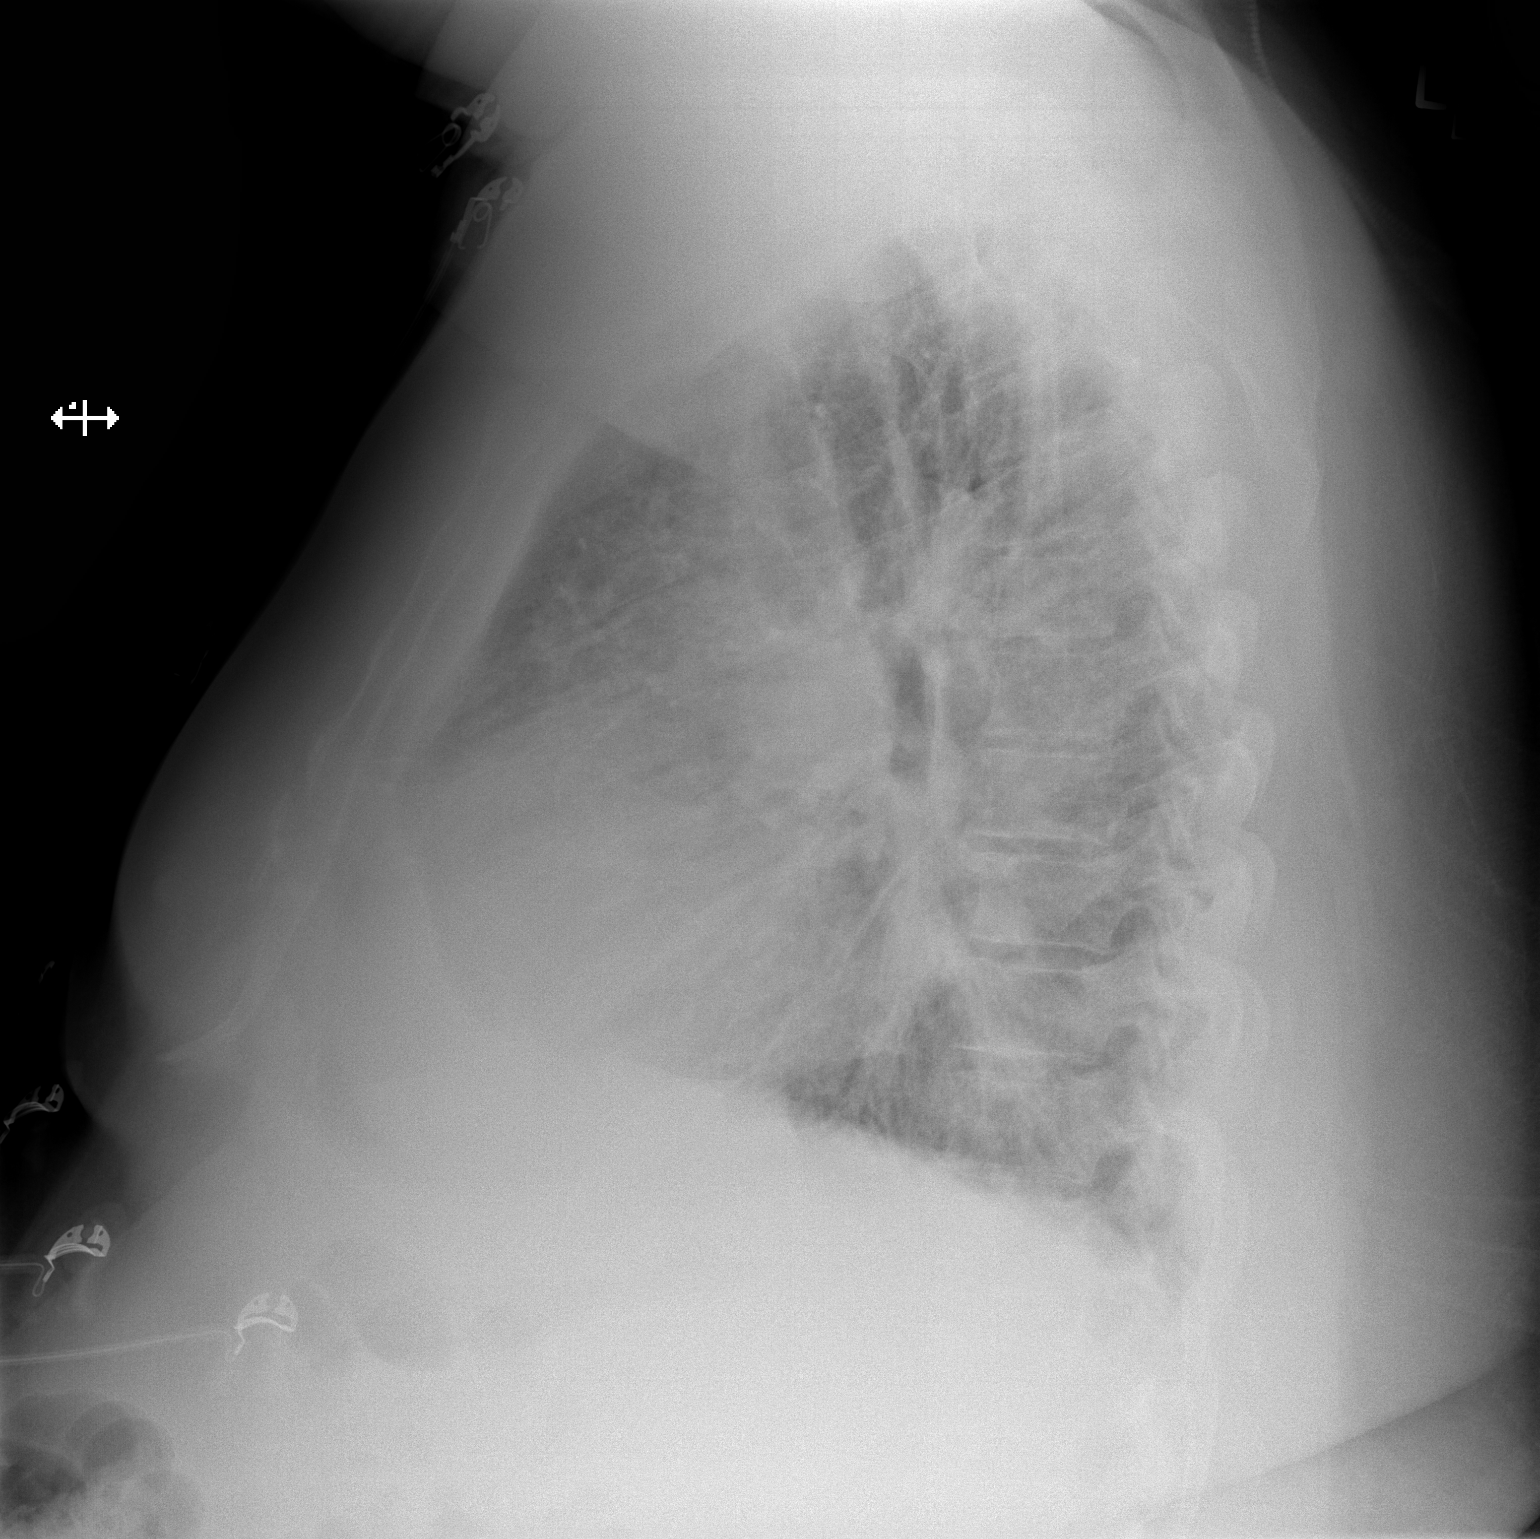

[2 of 2 positions shown; findings below may reference images not displayed]

FINDINGS: Heart is borderline in size. Mild vascular congestion. No overt
edema. No confluent opacities or effusions. No acute bony
abnormality.
IMPRESSION: Borderline heart size.  Mild vascular congestion.

## 2021-09-12 ENCOUNTER — Telehealth: Payer: Self-pay | Admitting: Cardiology

## 2021-09-12 ENCOUNTER — Encounter (HOSPITAL_COMMUNITY): Payer: Self-pay | Admitting: *Deleted

## 2021-09-12 ENCOUNTER — Other Ambulatory Visit (HOSPITAL_COMMUNITY): Payer: Self-pay | Admitting: *Deleted

## 2021-09-12 DIAGNOSIS — I4819 Other persistent atrial fibrillation: Secondary | ICD-10-CM

## 2021-09-12 NOTE — Telephone Encounter (Signed)
Cardioversion scheduled for 5/10. Arrival at Afib clinic at [facility] for labs/H&P. NPO after MN. Reminded no missed doses of Eliquis. Pt verbalized understanding.  ?

## 2021-09-12 NOTE — Telephone Encounter (Signed)
?  Pt would like to schedule cardiversion to make his heart rhythm back to normal. He said he is only available on a Wednesday. He said if he unable to to answer his phone, to leave him a message  ?

## 2021-09-19 ENCOUNTER — Encounter (HOSPITAL_COMMUNITY): Payer: Self-pay | Admitting: Cardiology

## 2021-09-19 ENCOUNTER — Encounter (HOSPITAL_COMMUNITY): Payer: Self-pay | Admitting: *Deleted

## 2021-09-26 ENCOUNTER — Encounter (HOSPITAL_COMMUNITY): Payer: Self-pay | Admitting: Physician Assistant

## 2021-09-26 ENCOUNTER — Encounter (HOSPITAL_COMMUNITY)
Admission: RE | Disposition: A | Discharge status: Home / Self Care | Payer: Self-pay | Source: Home / Self Care | Attending: Cardiology

## 2021-09-26 ENCOUNTER — Ambulatory Visit (HOSPITAL_BASED_OUTPATIENT_CLINIC_OR_DEPARTMENT_OTHER)
Admission: RE | Admit: 2021-09-26 | Discharge: 2021-09-26 | Disposition: A | Discharge status: Home / Self Care | Payer: BC Managed Care – PPO | Source: Ambulatory Visit | Attending: Physician Assistant | Admitting: Physician Assistant

## 2021-09-26 ENCOUNTER — Other Ambulatory Visit: Payer: Self-pay

## 2021-09-26 ENCOUNTER — Ambulatory Visit (HOSPITAL_COMMUNITY): Payer: BC Managed Care – PPO | Admitting: Certified Registered Nurse Anesthetist

## 2021-09-26 ENCOUNTER — Ambulatory Visit (HOSPITAL_COMMUNITY)
Admission: RE | Admit: 2021-09-26 | Discharge: 2021-09-26 | Disposition: A | Discharge status: Home / Self Care | Payer: BC Managed Care – PPO | Attending: Cardiology | Admitting: Cardiology

## 2021-09-26 ENCOUNTER — Encounter (HOSPITAL_COMMUNITY): Payer: Self-pay | Admitting: Cardiology

## 2021-09-26 VITALS — BP 142/100 | HR 94 | Ht 71.0 in | Wt 339.6 lb

## 2021-09-26 DIAGNOSIS — I5022 Chronic systolic (congestive) heart failure: Secondary | ICD-10-CM | POA: Insufficient documentation

## 2021-09-26 DIAGNOSIS — D6869 Other thrombophilia: Secondary | ICD-10-CM | POA: Diagnosis not present

## 2021-09-26 DIAGNOSIS — Z8679 Personal history of other diseases of the circulatory system: Secondary | ICD-10-CM | POA: Diagnosis not present

## 2021-09-26 DIAGNOSIS — I4819 Other persistent atrial fibrillation: Secondary | ICD-10-CM | POA: Diagnosis not present

## 2021-09-26 DIAGNOSIS — Z7901 Long term (current) use of anticoagulants: Secondary | ICD-10-CM | POA: Diagnosis not present

## 2021-09-26 DIAGNOSIS — I4891 Unspecified atrial fibrillation: Secondary | ICD-10-CM | POA: Diagnosis not present

## 2021-09-26 DIAGNOSIS — I509 Heart failure, unspecified: Secondary | ICD-10-CM | POA: Diagnosis not present

## 2021-09-26 DIAGNOSIS — I110 Hypertensive heart disease with heart failure: Secondary | ICD-10-CM | POA: Diagnosis not present

## 2021-09-26 DIAGNOSIS — Z6841 Body Mass Index (BMI) 40.0 and over, adult: Secondary | ICD-10-CM | POA: Insufficient documentation

## 2021-09-26 HISTORY — PX: CARDIOVERSION: SHX1299

## 2021-09-26 LAB — CBC
HCT: 45.3 % (ref 39.0–52.0)
Hemoglobin: 15.3 g/dL (ref 13.0–17.0)
MCH: 33.5 pg (ref 26.0–34.0)
MCHC: 33.8 g/dL (ref 30.0–36.0)
MCV: 99.1 fL (ref 80.0–100.0)
Platelets: 230 10*3/uL (ref 150–400)
RBC: 4.57 MIL/uL (ref 4.22–5.81)
RDW: 14.1 % (ref 11.5–15.5)
WBC: 9.1 10*3/uL (ref 4.0–10.5)
nRBC: 0 % (ref 0.0–0.2)

## 2021-09-26 LAB — BASIC METABOLIC PANEL
Anion gap: 8 (ref 5–15)
BUN: 13 mg/dL (ref 8–23)
CO2: 26 mmol/L (ref 22–32)
Calcium: 8.7 mg/dL — ABNORMAL LOW (ref 8.9–10.3)
Chloride: 106 mmol/L (ref 98–111)
Creatinine, Ser: 0.99 mg/dL (ref 0.61–1.24)
GFR, Estimated: >60 mL/min (ref >60)
Glucose, Bld: 105 mg/dL — ABNORMAL HIGH (ref 70–99)
Potassium: 3.5 mmol/L (ref 3.5–5.1)
Sodium: 140 mmol/L (ref 135–145)

## 2021-09-26 SURGERY — CARDIOVERSION
Anesthesia: General

## 2021-09-26 MED ORDER — PROPOFOL 10 MG/ML IV BOLUS
INTRAVENOUS | Status: DC | PRN
  Administered 2021-09-26: 60 mg via INTRAVENOUS
  Administered 2021-09-26: 20 mg via INTRAVENOUS

## 2021-09-26 MED ORDER — SODIUM CHLORIDE 0.9 % IV SOLN: INTRAVENOUS | Status: DC

## 2021-09-26 MED ORDER — LIDOCAINE 2% (20 MG/ML) 5 ML SYRINGE
INTRAMUSCULAR | Status: DC | PRN
Start: 2021-09-26 — End: 2021-09-26
  Administered 2021-09-26: 60 mg via INTRAVENOUS

## 2021-09-26 NOTE — Discharge Instructions (Signed)

## 2021-09-26 NOTE — Anesthesia Postprocedure Evaluation (Signed)
Anesthesia Post Note ? ?Patient: Parker Calderon ? ?Procedure(s) Performed: CARDIOVERSION ? ?  ? ?Patient location during evaluation: Endoscopy ?Anesthesia Type: General ?Level of consciousness: awake and alert ?Pain management: pain level controlled ?Vital Signs Assessment: post-procedure vital signs reviewed and stable ?Respiratory status: spontaneous breathing, nonlabored ventilation and respiratory function stable ?Cardiovascular status: blood pressure returned to baseline and stable ?Postop Assessment: no apparent nausea or vomiting ?Anesthetic complications: no ? ? ?No notable events documented. ? ?Last Vitals:  ?Vitals:  ? 09/26/21 1058 09/26/21 1104  ?BP: (!) 139/91 (!) 148/99  ?Pulse: 63 67  ?Resp: 15 16  ?Temp:    ?SpO2: 96% 100%  ?  ?Last Pain:  ?Vitals:  ? 09/26/21 1104  ?TempSrc:   ?PainSc: 0-No pain  ? ? ?  ?  ?  ?  ?  ?  ? ?Lidia Collum ? ? ? ? ?

## 2021-09-26 NOTE — Interval H&P Note (Signed)
History and Physical Interval Note: ? ?09/26/2021 ?10:17 AM ? ?Parker Calderon  has presented today for surgery, with the diagnosis of AFIB.  The various methods of treatment have been discussed with the patient and family. After consideration of risks, benefits and other options for treatment, the patient has consented to  Procedure(s): ?CARDIOVERSION (N/A) as a surgical intervention.  The patient's history has been reviewed, patient examined, no change in status, stable for surgery.  I have reviewed the patient's chart and labs.  Questions were answered to the patient's satisfaction.   ? ? ?[name] ? ? ?

## 2021-09-26 NOTE — H&P (View-Only) (Signed)
? ? ?Primary Care Physician: Shirline Frees, MD ?Primary Cardiologist: Dr Audie Box ?Primary Electrophysiologist: Dr Curt Bears  ?Referring Physician: Almyra Deforest PA ? ? ?Parker Calderon is a 61 y.o. male with a history of systolic HF (XN23-55%, normal LHC), persistent Afib s/p TEE/DCCV, morbidy obesity (BMI 46), HTN who presents for follow up in the [facility]. The patient was initially diagnosed with atrial fibrillation after being admitted with acute systolic CHF on 73/2/20 and found to be in afib with RVR. His systolic function was severely reduced <20%. LHC showed normal coronary arteries. Patient is on Eliquis for a CHADS2VASC score of 2. He underwent TEE/DCCV on 03/26/19 and was weaned off amiodarone. He denies any significant alcohol use or snoring. He was found to be back in afib 06/2019 and his amiodarone was resumed. He converted chemically.  ? ?On follow up today, patient presents for DCCV. Patient denies any missed doses of his Eliquis in the past 3 weeks. He has noted a steady weight gain over the past 2 months, about 10 lbs. No SOB or orthopnea.  ? ?Today, he denies symptoms of palpitations, chest pain, shortness of breath, orthopnea, PND, lower extremity edema, dizziness, presyncope, syncope, snoring, daytime somnolence, bleeding, or neurologic sequela. The patient is tolerating medications without difficulties and is otherwise without complaint today.  ? ? ?Atrial Fibrillation Risk Factors: ? ?he does not have symptoms or diagnosis of sleep apnea. ?he does not have a history of rheumatic fever. ?he does not have a history of alcohol use. ? ? ?he has a BMI of Body mass index is 47.36 kg/m?Marland KitchenMarland Kitchen ?Filed Weights  ? 09/26/21 0849  ?Weight: (!) 154 kg  ? ? ?Family History  ?Problem Relation Age of Onset  ? High blood pressure Mother   ? Kidney failure Father   ? ? ? ?Atrial Fibrillation Management history: ? ?Previous antiarrhythmic drugs: amiodarone ?Previous cardioversions:  03/26/19 ?Previous ablations: none ?CHADS2VASC score: 2 ?Anticoagulation history: Eliquis ? ? ?Past Medical History:  ?Diagnosis Date  ? Arthritis   ? Rhinitis  ? Atrial fibrillation ([place])   ? CHF (congestive heart failure) ([place])   ? Hyperlipidemia   ? Hypertension   ? Irritable bowel syndrome   ? Left thigh pain   ? Lateral, aching  ? Obesity, unspecified   ? Varicose veins   ? ?Past Surgical History:  ?Procedure Laterality Date  ? CARDIAC CATHETERIZATION    ? CARDIOVERSION N/A 03/26/2019  ? Procedure: CARDIOVERSION;  Surgeon: Josue Hector, MD;  Location: [facility] ENDOSCOPY;  Service: Cardiovascular;  Laterality: N/A;  ? RIGHT/LEFT HEART CATH AND CORONARY ANGIOGRAPHY N/A 03/25/2019  ? Procedure: RIGHT/LEFT HEART CATH AND CORONARY ANGIOGRAPHY;  Surgeon: Martinique, Peter M, MD;  Location: [facility] CV LAB;  Service: Cardiovascular;  Laterality: N/A;  ? TEE WITHOUT CARDIOVERSION N/A 03/26/2019  ? Procedure: TRANSESOPHAGEAL ECHOCARDIOGRAM (TEE);  Surgeon: Josue Hector, MD;  Location: [facility] ENDOSCOPY;  Service: Cardiovascular;  Laterality: N/A;  ? ? ?Current Outpatient Medications  ?Medication Sig Dispense Refill  ? amiodarone (PACERONE) 200 MG tablet TAKE 1 TABLET BY MOUTH EVERY DAY 90 tablet 1  ? apixaban (ELIQUIS) 5 MG TABS tablet Take 1 tablet (5 mg total) by mouth 2 (two) times daily. 60 tablet 6  ? atorvastatin (LIPITOR) 40 MG tablet TAKE 1 TABLET BY MOUTH EVERY DAY AT 6PM 90 tablet 3  ? furosemide (LASIX) 80 MG tablet TAKE 1 TABLET BY MOUTH EVERY DAY 90 tablet 3  ? metoprolol succinate (TOPROL-XL) 200 MG  24 hr tablet Take 1 tablet (200 mg total) by mouth daily. Take with or immediately following a meal. 90 tablet 2  ? Multiple Vitamin (MULTIVITAMIN) tablet Take 1 tablet by mouth daily.    ? sacubitril-valsartan (ENTRESTO) 97-103 MG Take 1 tablet by mouth 2 (two) times daily. 30 tablet 0  ? spironolactone (ALDACTONE) 25 MG tablet TAKE 1 TABLET BY MOUTH EVERY DAY 90 tablet 3  ? ?No current facility-administered medications  for this encounter.  ? ? ?No Known Allergies ? ?Social History  ? ?Socioeconomic History  ? Marital status: Married  ?  Spouse name: Not on file  ? Number of children: Not on file  ? Years of education: Not on file  ? Highest education level: High school graduate  ?Occupational History  ? Not on file  ?Tobacco Use  ? Smoking status: Never  ? Smokeless tobacco: Never  ? Tobacco comments:  ?  Never smoke 08/20/21  ?Vaping Use  ? Vaping Use: Not on file  ?Substance and Sexual Activity  ? Alcohol use: No  ? Drug use: No  ? Sexual activity: Not on file  ?Other Topics Concern  ? Not on file  ?Social History Narrative  ? Not on file  ? ?Social Determinants of Health  ? ?Financial Resource Strain: Not on file  ?Food Insecurity: Not on file  ?Transportation Needs: Not on file  ?Physical Activity: Not on file  ?Stress: Not on file  ?Social Connections: Not on file  ?Intimate Partner Violence: Not on file  ? ? ? ?ROS- All systems are reviewed and negative except as per the HPI above. ? ?Physical Exam: ?Vitals:  ? 09/26/21 0849  ?BP: (!) 142/100  ?Pulse: 94  ?Weight: (!) 154 kg  ?Height: '5\' 11"'$  (1.803 m)  ? ? ?GEN- The patient is a well appearing obese male, alert and oriented x 3 today.   ?HEENT-head normocephalic, atraumatic, sclera clear, conjunctiva pink, hearing intact, trachea midline. ?Lungs- Clear to ausculation bilaterally, normal work of breathing ?Heart- irregular rate and rhythm, no murmurs, rubs or gallops  ?GI- soft, NT, ND, + BS ?Extremities- no clubbing, cyanosis, or edema ?MS- no significant deformity or atrophy ?Skin- no rash or lesion ?Psych- euthymic mood, full affect ?Neuro- strength and sensation are intact ? ? ?Wt Readings from Last 3 Encounters:  ?09/26/21 (!) 154 kg  ?08/20/21 (!) 151.3 kg  ?08/06/21 (!) 148.3 kg  ? ? ?EKG today demonstrates  ?Afib ?Vent. rate 92 BPM ?PR interval * ms ?QRS duration 82 ms ?QT/QTcB 396/489 ms ? ?Echo 07/16/21 demonstrated  ? 1. Pt in atrial fibrillation with HR 120-140 at  time of study.  ? 2. Left ventricular ejection fraction, by estimation, is 55 to 60%. The  ?left ventricle has normal function. The left ventricle has no regional  ?wall motion abnormalities. The left ventricular internal cavity size was  ?mildly dilated. Left ventricular diastolic function could not be evaluated.  ? 3. Right ventricular systolic function is normal. The right ventricular  ?size is normal.  ? 4. Left atrial size was mildly dilated.  ? 5. The mitral valve is normal in structure. Trivial mitral valve  ?regurgitation. No evidence of mitral stenosis.  ? 6. The aortic valve is normal in structure. Aortic valve regurgitation is  ?trivial. No aortic stenosis is present.  ? 7. The inferior vena cava is normal in size with greater than 50%  ?respiratory variability, suggesting right atrial pressure of 3 mmHg.  ? ?Epic records are reviewed  at length today ? ?CHA2DS2-VASc Score = 2  ?The patient's score is based upon: ?CHF History: 1 ?HTN History: 1 ?Diabetes History: 0 ?Stroke History: 0 ?Vascular Disease History: 0 ?Age Score: 0 ?Gender Score: 0 ?    ? ? ?ASSESSMENT AND PLAN: ?1. Persistent Atrial Fibrillation (ICD10:  I48.19) ?The patient's CHA2DS2-VASc score is 2, indicating a 2.2% annual risk of stroke.   ?Patient presents for DCCV today. ?He has an appointment to discuss ablation with Dr Curt Bears although admittedly his weight and LA size are a disincentive. ?Continue Eliquis 5 mg BID, patient denies any missed doses in the last 3 weeks.  ?Continue Toprol 200 mg daily ? ?2. Secondary Hypercoagulable State (ICD10:  D68.69) ?The patient is at significant risk for stroke/thromboembolism based upon his CHA2DS2-VASc Score of 2.  Continue Apixaban (Eliquis).  ? ?3. Obesity ?Body mass index is 47.36 kg/m?. ?Lifestyle modification was discussed and encouraged including regular physical activity and weight reduction. ? ?4. Chronic systolic CHF ?Suspected tachycardia mediated.  ?EF 55-60% on last echo ?Weight  continues to trend up, he reports compliance with diuretics. Will be important to keep him in SR. ? ?5. HTN ?Stable, no changes today. ? ? ?Follow up with Dr Curt Bears as scheduled.  ? ? ?Ricky Girtha Kilgore PA-C ?[facility] ?[place]

## 2021-09-26 NOTE — Anesthesia Preprocedure Evaluation (Addendum)
Anesthesia Evaluation  ?Patient identified by MRN, date of birth, ID band ?Patient awake ? ? ? ?Reviewed: ?Allergy & Precautions, NPO status , Patient's Chart, lab work & pertinent test results ? ?History of Anesthesia Complications ?Negative for: history of anesthetic complications ? ?Airway ?Mallampati: II ? ?TM Distance: >3 FB ?Neck ROM: Full ? ? ? Dental ?  ?Pulmonary ?neg pulmonary ROS,  ?  ?Pulmonary exam normal ? ? ? ? ? ? ? Cardiovascular ?hypertension, Pt. on medications and Pt. on home beta blockers ?+CHF  ?+ dysrhythmias Atrial Fibrillation  ?Rhythm:Irregular Rate:Tachycardia ? ? ?Echo 07/16/21: EF 55-60%, no RWMA, normal RVSF, valves unremarkable ? ?[place] 2020:  ?1. Normal coronary anatomy ?2. Moderately elevated LV filling pressures. PCWP and LVEDP of 23 mm Hg ?3. Mild pulmonary venous HTN ?4. Preserved cardiac output. Index 3.22. ?  ?Neuro/Psych ?negative neurological ROS ?   ? GI/Hepatic ?negative GI ROS, Neg liver ROS,   ?Endo/Other  ?negative endocrine ROS ? Renal/GU ?negative Renal ROS  ?negative genitourinary ?  ?Musculoskeletal ?negative musculoskeletal ROS ?(+)  ? Abdominal ?  ?Peds ? Hematology ?negative hematology ROS ?(+)   ?Anesthesia Other Findings ? ? Reproductive/Obstetrics ? ?  ? ? ? ? ? ? ? ? ? ? ? ? ? ?  ?  ? ? ? ? ? ? ? ?Anesthesia Physical ?Anesthesia Plan ? ?ASA: 3 ? ?Anesthesia Plan: General  ? ?Post-op Pain Management: Minimal or no pain anticipated  ? ?Induction: Intravenous ? ?PONV Risk Score and Plan: 2 and TIVA and Treatment may vary due to age or medical condition ? ?Airway Management Planned: Mask ? ?Additional Equipment: None ? ?Intra-op Plan:  ? ?Post-operative Plan:  ? ?Informed Consent: I have reviewed the patients History and Physical, chart, labs and discussed the procedure including the risks, benefits and alternatives for the proposed anesthesia with the patient or authorized representative who has indicated his/her understanding and  acceptance.  ? ? ? ? ? ?Plan Discussed with:  ? ?Anesthesia Plan Comments:   ? ? ? ? ? ?Anesthesia Quick Evaluation ? ?

## 2021-09-26 NOTE — CV Procedure (Signed)
? ? ?  Electrical Cardioversion Procedure Note ?Parker Calderon ?[id number] ?1961-04-03 ? ?Procedure: Electrical Cardioversion ?Indications:  Atrial Fibrillation ? ?Time Out: Verified patient identification, verified procedure,medications/allergies/relevent history reviewed, required imaging and test results available.  Performed ? ?Procedure Details ? ?The patient was NPO after midnight. Anesthesia was administered at the beside  by Dr.Witman with propofol.  Cardioversion was performed with synchronized biphasic defibrillation via AP pads with 200 joules.  1 attempt(s) were performed.  The patient converted to normal sinus rhythm. The patient tolerated the procedure well  ? ?IMPRESSION: ? ?Successful cardioversion of atrial fibrillation ? ? ? ?[name] ?09/26/2021, 10:31 AM ? ?  ?

## 2021-09-26 NOTE — Progress Notes (Signed)
? ? ?Primary Care Physician: Shirline Frees, MD ?Primary Cardiologist: Dr Audie Box ?Primary Electrophysiologist: Dr Curt Bears  ?Referring Physician: Almyra Deforest PA ? ? ?Parker Calderon is a 61 y.o. male with a history of systolic HF (WJ19-14%, normal LHC), persistent Afib s/p TEE/DCCV, morbidy obesity (BMI 46), HTN who presents for follow up in the [facility]. The patient was initially diagnosed with atrial fibrillation after being admitted with acute systolic CHF on 78/2/95 and found to be in afib with RVR. His systolic function was severely reduced <20%. LHC showed normal coronary arteries. Patient is on Eliquis for a CHADS2VASC score of 2. He underwent TEE/DCCV on 03/26/19 and was weaned off amiodarone. He denies any significant alcohol use or snoring. He was found to be back in afib 06/2019 and his amiodarone was resumed. He converted chemically.  ? ?On follow up today, patient presents for DCCV. Patient denies any missed doses of his Eliquis in the past 3 weeks. He has noted a steady weight gain over the past 2 months, about 10 lbs. No SOB or orthopnea.  ? ?Today, he denies symptoms of palpitations, chest pain, shortness of breath, orthopnea, PND, lower extremity edema, dizziness, presyncope, syncope, snoring, daytime somnolence, bleeding, or neurologic sequela. The patient is tolerating medications without difficulties and is otherwise without complaint today.  ? ? ?Atrial Fibrillation Risk Factors: ? ?he does not have symptoms or diagnosis of sleep apnea. ?he does not have a history of rheumatic fever. ?he does not have a history of alcohol use. ? ? ?he has a BMI of Body mass index is 47.36 kg/m?Marland KitchenMarland Kitchen ?Filed Weights  ? 09/26/21 0849  ?Weight: (!) 154 kg  ? ? ?Family History  ?Problem Relation Age of Onset  ? High blood pressure Mother   ? Kidney failure Father   ? ? ? ?Atrial Fibrillation Management history: ? ?Previous antiarrhythmic drugs: amiodarone ?Previous cardioversions:  03/26/19 ?Previous ablations: none ?CHADS2VASC score: 2 ?Anticoagulation history: Eliquis ? ? ?Past Medical History:  ?Diagnosis Date  ? Arthritis   ? Rhinitis  ? Atrial fibrillation ([place])   ? CHF (congestive heart failure) ([place])   ? Hyperlipidemia   ? Hypertension   ? Irritable bowel syndrome   ? Left thigh pain   ? Lateral, aching  ? Obesity, unspecified   ? Varicose veins   ? ?Past Surgical History:  ?Procedure Laterality Date  ? CARDIAC CATHETERIZATION    ? CARDIOVERSION N/A 03/26/2019  ? Procedure: CARDIOVERSION;  Surgeon: Josue Hector, MD;  Location: [facility] ENDOSCOPY;  Service: Cardiovascular;  Laterality: N/A;  ? RIGHT/LEFT HEART CATH AND CORONARY ANGIOGRAPHY N/A 03/25/2019  ? Procedure: RIGHT/LEFT HEART CATH AND CORONARY ANGIOGRAPHY;  Surgeon: Martinique, Peter M, MD;  Location: [facility] CV LAB;  Service: Cardiovascular;  Laterality: N/A;  ? TEE WITHOUT CARDIOVERSION N/A 03/26/2019  ? Procedure: TRANSESOPHAGEAL ECHOCARDIOGRAM (TEE);  Surgeon: Josue Hector, MD;  Location: [facility] ENDOSCOPY;  Service: Cardiovascular;  Laterality: N/A;  ? ? ?Current Outpatient Medications  ?Medication Sig Dispense Refill  ? amiodarone (PACERONE) 200 MG tablet TAKE 1 TABLET BY MOUTH EVERY DAY 90 tablet 1  ? apixaban (ELIQUIS) 5 MG TABS tablet Take 1 tablet (5 mg total) by mouth 2 (two) times daily. 60 tablet 6  ? atorvastatin (LIPITOR) 40 MG tablet TAKE 1 TABLET BY MOUTH EVERY DAY AT 6PM 90 tablet 3  ? furosemide (LASIX) 80 MG tablet TAKE 1 TABLET BY MOUTH EVERY DAY 90 tablet 3  ? metoprolol succinate (TOPROL-XL) 200 MG  24 hr tablet Take 1 tablet (200 mg total) by mouth daily. Take with or immediately following a meal. 90 tablet 2  ? Multiple Vitamin (MULTIVITAMIN) tablet Take 1 tablet by mouth daily.    ? sacubitril-valsartan (ENTRESTO) 97-103 MG Take 1 tablet by mouth 2 (two) times daily. 30 tablet 0  ? spironolactone (ALDACTONE) 25 MG tablet TAKE 1 TABLET BY MOUTH EVERY DAY 90 tablet 3  ? ?No current facility-administered medications  for this encounter.  ? ? ?No Known Allergies ? ?Social History  ? ?Socioeconomic History  ? Marital status: Married  ?  Spouse name: Not on file  ? Number of children: Not on file  ? Years of education: Not on file  ? Highest education level: High school graduate  ?Occupational History  ? Not on file  ?Tobacco Use  ? Smoking status: Never  ? Smokeless tobacco: Never  ? Tobacco comments:  ?  Never smoke 08/20/21  ?Vaping Use  ? Vaping Use: Not on file  ?Substance and Sexual Activity  ? Alcohol use: No  ? Drug use: No  ? Sexual activity: Not on file  ?Other Topics Concern  ? Not on file  ?Social History Narrative  ? Not on file  ? ?Social Determinants of Health  ? ?Financial Resource Strain: Not on file  ?Food Insecurity: Not on file  ?Transportation Needs: Not on file  ?Physical Activity: Not on file  ?Stress: Not on file  ?Social Connections: Not on file  ?Intimate Partner Violence: Not on file  ? ? ? ?ROS- All systems are reviewed and negative except as per the HPI above. ? ?Physical Exam: ?Vitals:  ? 09/26/21 0849  ?BP: (!) 142/100  ?Pulse: 94  ?Weight: (!) 154 kg  ?Height: '5\' 11"'$  (1.803 m)  ? ? ?GEN- The patient is a well appearing obese male, alert and oriented x 3 today.   ?HEENT-head normocephalic, atraumatic, sclera clear, conjunctiva pink, hearing intact, trachea midline. ?Lungs- Clear to ausculation bilaterally, normal work of breathing ?Heart- irregular rate and rhythm, no murmurs, rubs or gallops  ?GI- soft, NT, ND, + BS ?Extremities- no clubbing, cyanosis, or edema ?MS- no significant deformity or atrophy ?Skin- no rash or lesion ?Psych- euthymic mood, full affect ?Neuro- strength and sensation are intact ? ? ?Wt Readings from Last 3 Encounters:  ?09/26/21 (!) 154 kg  ?08/20/21 (!) 151.3 kg  ?08/06/21 (!) 148.3 kg  ? ? ?EKG today demonstrates  ?Afib ?Vent. rate 92 BPM ?PR interval * ms ?QRS duration 82 ms ?QT/QTcB 396/489 ms ? ?Echo 07/16/21 demonstrated  ? 1. Pt in atrial fibrillation with HR 120-140 at  time of study.  ? 2. Left ventricular ejection fraction, by estimation, is 55 to 60%. The  ?left ventricle has normal function. The left ventricle has no regional  ?wall motion abnormalities. The left ventricular internal cavity size was  ?mildly dilated. Left ventricular diastolic function could not be evaluated.  ? 3. Right ventricular systolic function is normal. The right ventricular  ?size is normal.  ? 4. Left atrial size was mildly dilated.  ? 5. The mitral valve is normal in structure. Trivial mitral valve  ?regurgitation. No evidence of mitral stenosis.  ? 6. The aortic valve is normal in structure. Aortic valve regurgitation is  ?trivial. No aortic stenosis is present.  ? 7. The inferior vena cava is normal in size with greater than 50%  ?respiratory variability, suggesting right atrial pressure of 3 mmHg.  ? ?Epic records are reviewed  at length today ? ?CHA2DS2-VASc Score = 2  ?The patient's score is based upon: ?CHF History: 1 ?HTN History: 1 ?Diabetes History: 0 ?Stroke History: 0 ?Vascular Disease History: 0 ?Age Score: 0 ?Gender Score: 0 ?    ? ? ?ASSESSMENT AND PLAN: ?1. Persistent Atrial Fibrillation (ICD10:  I48.19) ?The patient's CHA2DS2-VASc score is 2, indicating a 2.2% annual risk of stroke.   ?Patient presents for DCCV today. ?He has an appointment to discuss ablation with Dr Curt Bears although admittedly his weight and LA size are a disincentive. ?Continue Eliquis 5 mg BID, patient denies any missed doses in the last 3 weeks.  ?Continue Toprol 200 mg daily ? ?2. Secondary Hypercoagulable State (ICD10:  D68.69) ?The patient is at significant risk for stroke/thromboembolism based upon his CHA2DS2-VASc Score of 2.  Continue Apixaban (Eliquis).  ? ?3. Obesity ?Body mass index is 47.36 kg/m?. ?Lifestyle modification was discussed and encouraged including regular physical activity and weight reduction. ? ?4. Chronic systolic CHF ?Suspected tachycardia mediated.  ?EF 55-60% on last echo ?Weight  continues to trend up, he reports compliance with diuretics. Will be important to keep him in SR. ? ?5. HTN ?Stable, no changes today. ? ? ?Follow up with Dr Curt Bears as scheduled.  ? ? ?Ricky Agness Sibrian PA-C ?[facility] ?[place]

## 2021-09-26 NOTE — Transfer of Care (Signed)
Immediate Anesthesia Transfer of Care Note ? ?Patient: Parker Calderon ? ?Procedure(s) Performed: CARDIOVERSION ? ?Patient Location: Endoscopy Unit ? ?Anesthesia Type:General ? ?Level of Consciousness: awake, alert  and oriented ? ?Airway & Oxygen Therapy: Patient Spontanous Breathing and Patient connected to face mask oxygen ? ?Post-op Assessment: Report given to RN and Post -op Vital signs reviewed and stable ? ?Post vital signs: Reviewed and stable ? ?Last Vitals:  ?Vitals Value Taken Time  ?BP 134/92   ?Temp    ?Pulse 73   ?Resp 12   ?SpO2    ? ? ?Last Pain:  ?Vitals:  ? 09/26/21 1000  ?TempSrc: Temporal  ?PainSc: 0-No pain  ?   ? ?  ? ?Complications: No notable events documented. ?

## 2021-09-26 NOTE — Anesthesia Procedure Notes (Signed)
Procedure Name: General with mask airway ?Date/Time: 09/26/2021 10:25 AM ?Performed by: Dorthea Cove, CRNA ?Pre-anesthesia Checklist: Patient identified, Emergency Drugs available, Suction available, Patient being monitored and Timeout performed ?Patient Re-evaluated:Patient Re-evaluated prior to induction ?Oxygen Delivery Method: Simple face mask ?Preoxygenation: Pre-oxygenation with 100% oxygen ?Induction Type: IV induction ?Placement Confirmation: positive ETCO2 and CO2 detector ?Dental Injury: Teeth and Oropharynx as per pre-operative assessment  ? ? ? ? ?

## 2021-09-27 ENCOUNTER — Encounter (HOSPITAL_COMMUNITY): Payer: Self-pay | Admitting: Cardiology

## 2021-10-11 ENCOUNTER — Encounter: Payer: Self-pay | Admitting: *Deleted

## 2021-10-11 ENCOUNTER — Encounter: Payer: Self-pay | Admitting: Cardiology

## 2021-10-11 ENCOUNTER — Ambulatory Visit: Payer: BC Managed Care – PPO | Admitting: Cardiology

## 2021-10-11 VITALS — BP 124/84 | HR 120 | Ht 71.0 in | Wt 335.2 lb

## 2021-10-11 DIAGNOSIS — I4819 Other persistent atrial fibrillation: Secondary | ICD-10-CM | POA: Diagnosis not present

## 2021-10-11 DIAGNOSIS — Z01812 Encounter for preprocedural laboratory examination: Secondary | ICD-10-CM | POA: Diagnosis not present

## 2021-10-11 MED ORDER — CARVEDILOL 25 MG PO TABS: 25.0000 mg | ORAL_TABLET | Freq: Two times a day (BID) | ORAL | 3 refills | Status: DC

## 2021-10-11 NOTE — Patient Instructions (Addendum)
Medication Instructions:  Your physician has recommended you make the following change in your medication:  STOP Toprol START Carvedilol 25 mg twice daily   *If you need a refill on your cardiac medications before your next appointment, please call your pharmacy*   Lab Work: Pre procedure labs 01/16/22:  BMP & CBC  If you have labs (blood work) drawn today and your tests are completely normal, you will receive your results only by: [place] (if you have MyChart) OR A paper copy in the mail If you have any lab test that is abnormal or we need to change your treatment, we will call you to review the results.   Testing/Procedures: Your physician has requested that you have cardiac CT within 7 days PRIOR to your ablation. Cardiac computed tomography (CT) is a painless test that uses an x-ray machine to take clear, detailed pictures of your heart.  Please follow instruction below located under "other instructions". You will get a call from our office to schedule the date for this test.  Your physician has recommended that you have an ablation. Catheter ablation is a medical procedure used to treat some cardiac arrhythmias (irregular heartbeats). During catheter ablation, a long, thin, flexible tube is put into a blood vessel in your groin (upper thigh), or neck. This tube is called an ablation catheter. It is then guided to your heart through the blood vessel. Radio frequency waves destroy small areas of heart tissue where abnormal heartbeats may cause an arrhythmia to start. Please follow instruction letter given to you today.   Follow-Up: At [facility], you and your health needs are our priority.  As part of our continuing mission to provide you with exceptional heart care, we have created designated Provider Care Teams.  These Care Teams include your primary Cardiologist (physician) and Advanced Practice Providers (APPs -  Physician Assistants and Nurse Practitioners) who all work  together to provide you with the care you need, when you need it.  Your next appointment:   1 month(s) after your ablation  The format for your next appointment:   In Person  Provider:   AFib clinic   Thank you for choosing [facility]!!   Trinidad Curet, RN [phone]    Other Instructions  Cardiac Ablation Cardiac ablation is a procedure to destroy (ablate) some heart tissue that is sending bad signals. These bad signals cause problems in heart rhythm. The heart has many areas that make these signals. If there are problems in these areas, they can make the heart beat in a way that is not normal. Destroying some tissues can help make the heart rhythm normal. Tell your doctor about: Any allergies you have. All medicines you are taking. These include vitamins, herbs, eye drops, creams, and over-the-counter medicines. Any problems you or family members have had with medicines that make you fall asleep (anesthetics). Any blood disorders you have. Any surgeries you have had. Any medical conditions you have, such as kidney failure. Whether you are pregnant or may be pregnant. What are the risks? This is a safe procedure. But problems may occur, including: Infection. Bruising and bleeding. Bleeding into the chest. Stroke or blood clots. Damage to nearby areas of your body. Allergies to medicines or dyes. The need for a pacemaker if the normal system is damaged. Failure of the procedure to treat the problem. What happens before the procedure? Medicines Ask your doctor about: Changing or stopping your normal medicines. This is important. Taking aspirin and ibuprofen.  Do not take these medicines unless your doctor tells you to take them. Taking other medicines, vitamins, herbs, and supplements. General instructions Follow instructions from your doctor about what you cannot eat or drink. Plan to have someone take you home from the hospital or clinic. If you will be  going home right after the procedure, plan to have someone with you for 24 hours. Ask your doctor what steps will be taken to prevent infection. What happens during the procedure?  An IV tube will be put into one of your veins. You will be given a medicine to help you relax. The skin on your neck or groin will be numbed. A cut (incision) will be made in your neck or groin. A needle will be put through your cut and into a large vein. A tube (catheter) will be put into the needle. The tube will be moved to your heart. Dye may be put through the tube. This helps your doctor see your heart. Small devices (electrodes) on the tube will send out signals. A type of energy will be used to destroy some heart tissue. The tube will be taken out. Pressure will be held on your cut. This helps stop bleeding. A bandage will be put over your cut. The exact procedure may vary among doctors and hospitals. What happens after the procedure? You will be watched until you leave the hospital or clinic. This includes checking your heart rate, breathing rate, oxygen, and blood pressure. Your cut will be watched for bleeding. You will need to lie still for a few hours. Do not drive for 24 hours or as long as your doctor tells you. Summary Cardiac ablation is a procedure to destroy some heart tissue. This is done to treat heart rhythm problems. Tell your doctor about any medical conditions you may have. Tell him or her about all medicines you are taking to treat them. This is a safe procedure. But problems may occur. These include infection, bruising, bleeding, and damage to nearby areas of your body. Follow what your doctor tells you about food and drink. You may also be told to change or stop some of your medicines. After the procedure, do not drive for 24 hours or as long as your doctor tells you. This information is not intended to replace advice given to you by your health care provider. Make sure you discuss any  questions you have with your health care provider. Document Revised: 04/08/2019 Document Reviewed: 04/08/2019 Elsevier Patient Education  [place].

## 2021-10-11 NOTE — Progress Notes (Signed)
Electrophysiology Office Note   Date:  10/11/2021   ID:  MARGUES FILIPPINI, DOB [birth year], MRN [id number]  PCP:  Shirline Frees, MD  Cardiologist:  Farris Has Primary Electrophysiologist:  Heavan Francom Meredith Leeds, MD    Chief Complaint: AF, CHF   History of Present Illness: Parker Calderon is a 61 y.o. male who is being seen today for the evaluation of AF, CHF at the request of Shirline Frees, MD. Presenting today for electrophysiology evaluation.  He has a history significant for systolic heart failure, atrial fibrillation, morbid obesity.  He is currently on amiodarone.  It was thought that atrial fibrillation was the cause of his heart failure.  With maintenance of sinus rhythm, his ejection fraction improved.  He is currently on amiodarone.  He had a cardioversion 09/26/2021.  Unfortunately he is continued to have episodes of atrial fibrillation on and off.  He is minimally symptomatic, does not note fatigue or weakness.  He does state that at times his heart rate is in the 50s and 60s, but it also jumps into the low 100s as well.  Today, denies symptoms of palpitations, chest pain, shortness of breath, orthopnea, PND, lower extremity edema, claudication, dizziness, presyncope, syncope, bleeding, or neurologic sequela. The patient is tolerating medications without difficulties.     Past Medical History:  Diagnosis Date   Arthritis    Rhinitis   Atrial fibrillation (HCC)    CHF (congestive heart failure) (HCC)    Hyperlipidemia    Hypertension    Irritable bowel syndrome    Left thigh pain    Lateral, aching   Obesity, unspecified    Varicose veins    Past Surgical History:  Procedure Laterality Date   CARDIAC CATHETERIZATION     CARDIOVERSION N/A 03/26/2019   Procedure: CARDIOVERSION;  Surgeon: Josue Hector, MD;  Location: [facility];  Service: Cardiovascular;  Laterality: N/A;   CARDIOVERSION N/A 09/26/2021   Procedure: CARDIOVERSION;  Surgeon: Jerline Pain, MD;  Location: [facility]  ENDOSCOPY;  Service: Cardiovascular;  Laterality: N/A;   RIGHT/LEFT HEART CATH AND CORONARY ANGIOGRAPHY N/A 03/25/2019   Procedure: RIGHT/LEFT HEART CATH AND CORONARY ANGIOGRAPHY;  Surgeon: Martinique, Peter M, MD;  Location: [facility] CV LAB;  Service: Cardiovascular;  Laterality: N/A;   TEE WITHOUT CARDIOVERSION N/A 03/26/2019   Procedure: TRANSESOPHAGEAL ECHOCARDIOGRAM (TEE);  Surgeon: Josue Hector, MD;  Location: [facility] ENDOSCOPY;  Service: Cardiovascular;  Laterality: N/A;     Current Outpatient Medications  Medication Sig Dispense Refill   amiodarone (PACERONE) 200 MG tablet TAKE 1 TABLET BY MOUTH EVERY DAY 90 tablet 1   apixaban (ELIQUIS) 5 MG TABS tablet Take 1 tablet (5 mg total) by mouth 2 (two) times daily. 60 tablet 6   atorvastatin (LIPITOR) 40 MG tablet TAKE 1 TABLET BY MOUTH EVERY DAY AT 6PM 90 tablet 3   carvedilol (COREG) 25 MG tablet Take 1 tablet (25 mg total) by mouth 2 (two) times daily. 60 tablet 3   furosemide (LASIX) 80 MG tablet TAKE 1 TABLET BY MOUTH EVERY DAY 90 tablet 3   Multiple Vitamin (MULTIVITAMIN) tablet Take 1 tablet by mouth daily.     sacubitril-valsartan (ENTRESTO) 97-103 MG Take 1 tablet by mouth 2 (two) times daily. 30 tablet 0   spironolactone (ALDACTONE) 25 MG tablet TAKE 1 TABLET BY MOUTH EVERY DAY 90 tablet 3   No current facility-administered medications for this visit.    Allergies:   Patient has no known allergies.   Social History:  The patient  reports that he has never smoked. He has never used smokeless tobacco. He reports that he does not drink alcohol and does not use drugs.   Family History:  The patient's family history includes High blood pressure in his mother; Kidney failure in his father.   ROS:  Please see the history of present illness.   Otherwise, review of systems is positive for none.   All other systems are reviewed and negative.   PHYSICAL EXAM: VS:  BP 124/84   Pulse (!) 120   Ht '5\' 11"'$  (1.803 m)   Wt (!) 335 lb 3.2 oz  (152 kg)   SpO2 96%   BMI 46.75 kg/m  , BMI Body mass index is 46.75 kg/m. GEN: Well nourished, well developed, in no acute distress  HEENT: normal  Neck: no JVD, carotid bruits, or masses Cardiac: irregular; no murmurs, rubs, or gallops,no edema  Respiratory:  clear to auscultation bilaterally, normal work of breathing GI: soft, nontender, nondistended, + BS MS: no deformity or atrophy  Skin: warm and dry Neuro:  Strength and sensation are intact Psych: euthymic mood, full affect  EKG:  EKG is ordered today. Personal review of the ekg ordered shows atrial fibrillation, rate 120  Recent Labs: 04/02/2021: TSH 0.729 09/26/2021: BUN 13; Creatinine, Ser 0.99; Hemoglobin 15.3; Platelets 230; Potassium 3.5; Sodium 140    Lipid Panel     Component Value Date/Time   CHOL 159 03/23/2019 1253   TRIG 105 03/23/2019 1253   HDL 32 (L) 03/23/2019 1253   CHOLHDL 5.0 03/23/2019 1253   VLDL 21 03/23/2019 1253   LDLCALC 106 (H) 03/23/2019 1253     Wt Readings from Last 3 Encounters:  10/11/21 (!) 335 lb 3.2 oz (152 kg)  09/26/21 (!) 339 lb 9.6 oz (154 kg)  09/26/21 (!) 339 lb 9.6 oz (154 kg)      Other studies Reviewed: Additional studies/ records that were reviewed today include: TTE 03/22/19  Review of the above records today demonstrates:   1. Left ventricular ejection fraction, by visual estimation, is <20%. The  left ventricle has severely decreased function. There is no left  ventricular hypertrophy.   2. Definity contrast agent was given IV to delineate the left ventricular  endocardial borders.   3. Moderately dilated left ventricular internal cavity size.   4. The left ventricle demonstrates global hypokinesis.   5. Left ventricular diastolic parameters are indeterminate.   6. LV thrombus excluded by echo contrast.   7. Global right ventricle has severely reduced systolic function.The  right ventricular size is not well visualized. Right vetricular wall  thickness was  not assessed.   8. Left atrial size was normal.   9. Right atrial size was normal.  10. Mild to moderate aortic valve annular calcification.  11. The mitral valve is normal in structure. Mild to moderate mitral valve  regurgitation.  12. The tricuspid valve is normal in structure. Tricuspid valve  regurgitation mild-moderate.  13. The aortic valve is tricuspid. Aortic valve regurgitation is not  visualized. No evidence of aortic valve sclerosis or stenosis.  14. The pulmonic valve was not well visualized. Pulmonic valve  regurgitation is not visualized.  15. Mildly elevated pulmonary artery systolic pressure.  16. The inferior vena cava is dilated in size with <50% respiratory  variability, suggesting right atrial pressure of 15 mmHg.  LHC 03/25/19 1. Normal coronary anatomy 2. Moderately elevated LV filling pressures. PCWP and LVEDP of 23 mm Hg 3. Mild  pulmonary venous HTN 4. Preserved cardiac output. Index 3.22.  ASSESSMENT AND PLAN:  1.  Chronic systolic heart failure: Due to nonischemic cardiomyopathy.  Diagnosed in November 2020 in the setting of rapid atrial fibrillation.  Currently on Toprol-XL 200 mg daily, Aldactone 25 mg daily, Entresto 97/103 mg twice daily.  Repeat echo with a normalized ejection fraction.  2.  Persistent atrial fibrillation: Currently on amiodarone, metoprolol, Eliquis.  CHA2DS2-VASc of 2.  He is unfortunately on back into atrial fibrillation.  He is currently in and out of atrial fibrillation.  He would like to stay in normal rhythm.  It appears that amiodarone is not controlling his symptoms.  Due to that, we Tamaria Dunleavy plan for ablation.  He feels like his metoprolol is causing him to gain weight.  We Skyann Ganim switch him to carvedilol 25 mg twice daily.  Risk, benefits, and alternatives to EP study and radiofrequency ablation for afib were also discussed in detail today. These risks include but are not limited to stroke, bleeding, vascular damage, tamponade,  perforation, damage to the esophagus, lungs, and other structures, pulmonary vein stenosis, worsening renal function, and death. The patient understands these risk and wishes to proceed.  We Juneau Doughman therefore proceed with catheter ablation at the next available time.  Carto, ICE, anesthesia are requested for the procedure.  Sevin Langenbach also obtain CT PV protocol prior to the procedure to exclude LAA thrombus and further evaluate atrial anatomy.   3.  Hypertension: well controlled  Current medicines are reviewed at length with the patient today.   The patient does not have concerns regarding his medicines.  The following changes were made today: Stop metoprolol start carvedilol  Labs/ tests ordered today include:  Orders Placed This Encounter  Procedures   CT CARDIAC MORPH/PULM VEIN W/CM&W/O CA SCORE   Basic metabolic panel   CBC   EKG 12-Lead     Disposition:   FU with Trianna Lupien 3 months  Signed, Athaliah Baumbach Meredith Leeds, MD  10/11/2021 11:46 AM     [facility] : [facility] [address] [place] [address] [phone] (office) [phone] (fax)

## 2021-11-22 ENCOUNTER — Other Ambulatory Visit: Payer: Self-pay | Admitting: Cardiology

## 2021-11-22 ENCOUNTER — Other Ambulatory Visit: Payer: Self-pay | Admitting: Cardiovascular Disease

## 2021-11-23 NOTE — Telephone Encounter (Signed)
This is Dr. O'Neal's pt 

## 2021-12-24 ENCOUNTER — Other Ambulatory Visit: Payer: Self-pay | Admitting: Cardiovascular Disease

## 2022-01-01 ENCOUNTER — Other Ambulatory Visit: Payer: Self-pay | Admitting: *Deleted

## 2022-01-01 DIAGNOSIS — I4819 Other persistent atrial fibrillation: Secondary | ICD-10-CM

## 2022-01-01 DIAGNOSIS — Z79899 Other long term (current) drug therapy: Secondary | ICD-10-CM

## 2022-01-16 ENCOUNTER — Ambulatory Visit: Payer: BC Managed Care – PPO | Attending: Cardiology

## 2022-01-16 DIAGNOSIS — Z79899 Other long term (current) drug therapy: Secondary | ICD-10-CM

## 2022-01-16 DIAGNOSIS — Z01812 Encounter for preprocedural laboratory examination: Secondary | ICD-10-CM

## 2022-01-16 DIAGNOSIS — I4819 Other persistent atrial fibrillation: Secondary | ICD-10-CM

## 2022-01-16 LAB — BASIC METABOLIC PANEL
BUN/Creatinine Ratio: 13 (ref 10–24)
BUN: 16 mg/dL (ref 8–27)
CO2: 34 mmol/L — ABNORMAL HIGH (ref 20–29)
Calcium: 9.6 mg/dL (ref 8.6–10.2)
Chloride: 99 mmol/L (ref 96–106)
Creatinine, Ser: 1.2 mg/dL (ref 0.76–1.27)
Glucose: 118 mg/dL — ABNORMAL HIGH (ref 70–99)
Potassium: 4.1 mmol/L (ref 3.5–5.2)
Sodium: 140 mmol/L (ref 134–144)
eGFR: 68 mL/min/{1.73_m2} (ref >59)

## 2022-01-16 LAB — CBC
Hematocrit: 47.2 % (ref 37.5–51.0)
Hemoglobin: 16.5 g/dL (ref 13.0–17.7)
MCH: 33.4 pg — ABNORMAL HIGH (ref 26.6–33.0)
MCHC: 35 g/dL (ref 31.5–35.7)
MCV: 96 fL (ref 79–97)
Platelets: 257 10*3/uL (ref 150–450)
RBC: 4.94 x10E6/uL (ref 4.14–5.80)
RDW: 13.4 % (ref 11.6–15.4)
WBC: 12.1 10*3/uL — ABNORMAL HIGH (ref 3.4–10.8)

## 2022-01-16 LAB — TSH: TSH: 2.23 u[IU]/mL (ref 0.450–4.500)

## 2022-01-16 LAB — HEPATIC FUNCTION PANEL
ALT: 8 IU/L (ref 0–44)
AST: 17 IU/L (ref 0–40)
Albumin: 4.2 g/dL (ref 3.9–4.9)
Alkaline Phosphatase: 63 IU/L (ref 44–121)
Bilirubin Total: 0.4 mg/dL (ref 0.0–1.2)
Bilirubin, Direct: 0.11 mg/dL (ref 0.00–0.40)
Total Protein: 6.4 g/dL (ref 6.0–8.5)

## 2022-01-20 ENCOUNTER — Other Ambulatory Visit: Payer: Self-pay | Admitting: Cardiovascular Disease

## 2022-01-24 ENCOUNTER — Telehealth: Payer: Self-pay | Admitting: Cardiovascular Disease

## 2022-01-24 NOTE — Telephone Encounter (Signed)
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 He said they said they had not received the clearance. Please fax to [phone].

## 2022-01-24 NOTE — Telephone Encounter (Signed)
Dental clearance has not been received.

## 2022-01-29 ENCOUNTER — Telehealth (HOSPITAL_COMMUNITY): Payer: Self-pay | Admitting: Emergency Medicine

## 2022-01-29 NOTE — Telephone Encounter (Signed)
Reaching out to patient to offer assistance regarding upcoming cardiac imaging study; pt verbalizes understanding of appt date/time, parking situation and where to check in, pre-test NPO status and medications ordered, and verified current allergies; name and call back number provided for further questions should they arise Marchia Bond RN Navigator [facility] [phone] office [phone] cell  Arrival 730 w/c entrance Holding diuretics Denies iv issues

## 2022-01-30 ENCOUNTER — Ambulatory Visit (HOSPITAL_COMMUNITY)
Admission: RE | Admit: 2022-01-30 | Discharge: 2022-01-30 | Disposition: A | Discharge status: Home / Self Care | Payer: BC Managed Care – PPO | Source: Ambulatory Visit | Attending: Cardiology | Admitting: Cardiology

## 2022-01-30 DIAGNOSIS — I4819 Other persistent atrial fibrillation: Secondary | ICD-10-CM | POA: Insufficient documentation

## 2022-01-30 MED ORDER — IOHEXOL 350 MG/ML SOLN
100.0000 mL | Freq: Once | INTRAVENOUS | Status: AC | PRN
  Administered 2022-01-30: 100 mL via INTRAVENOUS

## 2022-02-01 ENCOUNTER — Other Ambulatory Visit (HOSPITAL_COMMUNITY): Payer: BC Managed Care – PPO

## 2022-02-01 ENCOUNTER — Telehealth: Payer: Self-pay | Admitting: Cardiology

## 2022-02-01 NOTE — Telephone Encounter (Signed)
Pt would like a callback from nurse regarding upcoming procedure on 9/22. Please advise

## 2022-02-01 NOTE — Telephone Encounter (Signed)
Pt called to cancel next weeks ablation procedure scheduled for 9/22. 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 He also reports feeling good for awhile now on Amiodarone, not having any afib. Pt educated to AFib and possible breakthrough, also informed that we in to next year if he calls to reschedule. Pt aware to keep f/u 12/22 w/ Dr. Curt Bears and that I will call if provider would like to see him sooner. Patient verbalized understanding and agreeable to plan.

## 2022-02-04 NOTE — Telephone Encounter (Signed)
   Pre-operative Risk Assessment    Patient Name: Parker Calderon  DOB: 1960/09/10 MRN: [id number]     Request for Surgical Clearance    Procedure:  Dental Extraction - Amount of Teeth to be Pulled:  1  Date of Surgery:  Clearance TBD                                 Surgeon:  Dr. Monica Martinez Group or Practice Name:  [facility] Phone number:  [phone] Fax number:  [phone]   Type of Clearance Requested:   - Medical    Type of Anesthesia:  Not Indicated   Additional requests/questions:   Patient stated he will need signed documentation that it will be OK for him to have his teeth extracted.  Patient stated he previously faxed paperwork for signature.   Signed, Heloise Beecham   02/04/2022, 8:17 AM

## 2022-02-06 NOTE — Telephone Encounter (Signed)
   Patient Name: Parker Calderon  DOB: Dec 04, 1960 MRN: [id number]  Primary Cardiologist: Evalina Field, MD  Chart reviewed as part of pre-operative protocol coverage. Recent ablation cancelled by patient.  Simple dental extractions (i.e. 1-2 teeth) are considered low risk procedures per guidelines and generally do not require any specific cardiac clearance. It is also generally accepted that for simple extractions and dental cleanings, there is no need to interrupt blood thinner therapy.  SBE prophylaxis is not required for the patient from a cardiac standpoint.  I will route this recommendation to the requesting party via Epic fax function and remove from pre-op pool.  Please call with questions.  Charlie Pitter, PA-C 02/06/2022, 2:59 PM

## 2022-02-08 ENCOUNTER — Encounter (HOSPITAL_COMMUNITY): Admission: RE | Payer: Self-pay | Source: Ambulatory Visit

## 2022-02-08 ENCOUNTER — Ambulatory Visit (HOSPITAL_COMMUNITY): Admission: RE | Admit: 2022-02-08 | Payer: BC Managed Care – PPO | Source: Ambulatory Visit | Admitting: Cardiology

## 2022-02-08 SURGERY — ATRIAL FIBRILLATION ABLATION
Anesthesia: General

## 2022-03-08 ENCOUNTER — Ambulatory Visit (HOSPITAL_COMMUNITY): Payer: BC Managed Care – PPO | Admitting: Physician Assistant

## 2022-03-20 ENCOUNTER — Other Ambulatory Visit: Payer: Self-pay | Admitting: Cardiovascular Disease

## 2022-05-10 ENCOUNTER — Ambulatory Visit: Payer: BC Managed Care – PPO | Admitting: Cardiology

## 2022-06-01 DIAGNOSIS — R051 Acute cough: Secondary | ICD-10-CM | POA: Diagnosis not present

## 2022-06-15 ENCOUNTER — Other Ambulatory Visit (HOSPITAL_COMMUNITY): Payer: Self-pay | Admitting: Physician Assistant

## 2022-06-15 DIAGNOSIS — I4819 Other persistent atrial fibrillation: Secondary | ICD-10-CM

## 2022-06-17 NOTE — Telephone Encounter (Signed)
Prescription refill request for Eliquis received. Indication: Afib  Last office visit: 10/11/21 ([place])  Scr: 1.20 (01/16/22)  Age: 62 Weight: 152kg  Appropriate dose. Refill sent.

## 2022-06-27 ENCOUNTER — Other Ambulatory Visit: Payer: Self-pay | Admitting: Cardiovascular Disease

## 2022-07-06 DIAGNOSIS — N390 Urinary tract infection, site not specified: Secondary | ICD-10-CM | POA: Diagnosis not present

## 2022-07-14 DIAGNOSIS — Z713 Dietary counseling and surveillance: Secondary | ICD-10-CM | POA: Diagnosis not present

## 2022-07-14 DIAGNOSIS — Z6841 Body Mass Index (BMI) 40.0 and over, adult: Secondary | ICD-10-CM | POA: Diagnosis not present

## 2022-07-14 DIAGNOSIS — I10 Essential (primary) hypertension: Secondary | ICD-10-CM | POA: Diagnosis not present

## 2022-07-14 DIAGNOSIS — Z136 Encounter for screening for cardiovascular disorders: Secondary | ICD-10-CM | POA: Diagnosis not present

## 2022-07-14 DIAGNOSIS — Z13220 Encounter for screening for lipoid disorders: Secondary | ICD-10-CM | POA: Diagnosis not present

## 2022-07-14 DIAGNOSIS — E7800 Pure hypercholesterolemia, unspecified: Secondary | ICD-10-CM | POA: Diagnosis not present

## 2022-12-10 ENCOUNTER — Other Ambulatory Visit: Payer: Self-pay | Admitting: Cardiovascular Disease

## 2022-12-10 ENCOUNTER — Other Ambulatory Visit: Payer: Self-pay | Admitting: Cardiology

## 2023-01-04 ENCOUNTER — Other Ambulatory Visit: Payer: Self-pay | Admitting: Cardiovascular Disease

## 2023-01-04 ENCOUNTER — Other Ambulatory Visit: Payer: Self-pay | Admitting: Cardiology

## 2023-01-15 ENCOUNTER — Other Ambulatory Visit: Payer: Self-pay | Admitting: Cardiology

## 2023-01-27 ENCOUNTER — Other Ambulatory Visit: Payer: Self-pay | Admitting: Cardiology

## 2023-02-05 ENCOUNTER — Other Ambulatory Visit: Payer: Self-pay | Admitting: Cardiovascular Disease

## 2023-03-02 ENCOUNTER — Other Ambulatory Visit: Payer: Self-pay | Admitting: Cardiology

## 2023-03-09 ENCOUNTER — Other Ambulatory Visit: Payer: Self-pay | Admitting: Cardiovascular Disease

## 2023-03-10 ENCOUNTER — Other Ambulatory Visit: Payer: Self-pay | Admitting: Cardiovascular Disease

## 2023-03-17 ENCOUNTER — Other Ambulatory Visit: Payer: Self-pay | Admitting: Cardiology

## 2023-03-28 ENCOUNTER — Other Ambulatory Visit: Payer: Self-pay | Admitting: Cardiology

## 2023-03-28 NOTE — Telephone Encounter (Signed)
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

## 2023-04-12 ENCOUNTER — Other Ambulatory Visit: Payer: Self-pay | Admitting: Cardiovascular Disease

## 2023-05-07 ENCOUNTER — Other Ambulatory Visit: Payer: Self-pay | Admitting: Cardiology

## 2023-05-15 ENCOUNTER — Other Ambulatory Visit: Payer: Self-pay | Admitting: Cardiology

## 2023-05-20 ENCOUNTER — Other Ambulatory Visit: Payer: Self-pay | Admitting: Cardiology

## 2023-05-22 NOTE — Telephone Encounter (Signed)
 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 Pt is requesting a refill and is passed his 3rd attempt. What would Dr. Elberta Fortis like to do? Please advise.

## 2023-06-14 DIAGNOSIS — Z6841 Body Mass Index (BMI) 40.0 and over, adult: Secondary | ICD-10-CM | POA: Diagnosis not present

## 2023-06-14 DIAGNOSIS — Z13220 Encounter for screening for lipoid disorders: Secondary | ICD-10-CM | POA: Diagnosis not present

## 2023-06-14 DIAGNOSIS — Z713 Dietary counseling and surveillance: Secondary | ICD-10-CM | POA: Diagnosis not present

## 2023-06-14 DIAGNOSIS — Z131 Encounter for screening for diabetes mellitus: Secondary | ICD-10-CM | POA: Diagnosis not present

## 2023-06-14 DIAGNOSIS — Z136 Encounter for screening for cardiovascular disorders: Secondary | ICD-10-CM | POA: Diagnosis not present

## 2023-06-23 ENCOUNTER — Other Ambulatory Visit: Payer: Self-pay | Admitting: Cardiology

## 2023-12-20 DIAGNOSIS — R600 Localized edema: Secondary | ICD-10-CM | POA: Diagnosis not present

## 2023-12-20 DIAGNOSIS — I890 Lymphedema, not elsewhere classified: Secondary | ICD-10-CM | POA: Diagnosis not present

## 2023-12-20 DIAGNOSIS — I160 Hypertensive urgency: Secondary | ICD-10-CM | POA: Diagnosis not present

## 2024-05-14 DIAGNOSIS — L03116 Cellulitis of left lower limb: Secondary | ICD-10-CM | POA: Diagnosis not present

## 2024-05-14 DIAGNOSIS — L089 Local infection of the skin and subcutaneous tissue, unspecified: Secondary | ICD-10-CM | POA: Diagnosis not present

## 2024-06-28 ENCOUNTER — Encounter (HOSPITAL_BASED_OUTPATIENT_CLINIC_OR_DEPARTMENT_OTHER): Admitting: General Surgery
